# Patient Record
Sex: Female | Born: 1960 | Race: Black or African American | Hispanic: No | Smoking: Never smoker
Health system: Southern US, Community
[De-identification: ages and names within clinical notes are randomized; demographics above are authoritative.]

## PROBLEM LIST (undated history)

## (undated) DIAGNOSIS — M109 Gout, unspecified: Secondary | ICD-10-CM

## (undated) DIAGNOSIS — R011 Cardiac murmur, unspecified: Secondary | ICD-10-CM

## (undated) DIAGNOSIS — I499 Cardiac arrhythmia, unspecified: Secondary | ICD-10-CM

## (undated) DIAGNOSIS — J189 Pneumonia, unspecified organism: Secondary | ICD-10-CM

## (undated) DIAGNOSIS — M1990 Unspecified osteoarthritis, unspecified site: Secondary | ICD-10-CM

## (undated) DIAGNOSIS — C50919 Malignant neoplasm of unspecified site of unspecified female breast: Secondary | ICD-10-CM

## (undated) DIAGNOSIS — Z6841 Body Mass Index (BMI) 40.0 and over, adult: Secondary | ICD-10-CM

## (undated) DIAGNOSIS — IMO0001 Reserved for inherently not codable concepts without codable children: Secondary | ICD-10-CM

## (undated) DIAGNOSIS — I10 Essential (primary) hypertension: Secondary | ICD-10-CM

## (undated) HISTORY — DX: Body Mass Index (BMI) 40.0 and over, adult: Z684

## (undated) HISTORY — DX: Malignant neoplasm of unspecified site of unspecified female breast: C50.919

## (undated) HISTORY — PX: COLONOSCOPY: SHX174

## (undated) HISTORY — PX: TONSILLECTOMY: SUR1361

## (undated) HISTORY — DX: Morbid (severe) obesity due to excess calories: E66.01

## (undated) HISTORY — DX: Essential (primary) hypertension: I10

## (undated) HISTORY — DX: Gout, unspecified: M10.9

## (undated) HISTORY — DX: Unspecified osteoarthritis, unspecified site: M19.90

## (undated) HISTORY — PX: FRACTURE SURGERY: SHX138

---

## 1990-03-04 HISTORY — PX: MASTECTOMY: SHX3

## 1997-11-11 ENCOUNTER — Ambulatory Visit (HOSPITAL_COMMUNITY): Admission: RE | Admit: 1997-11-11 | Discharge: 1997-11-11 | Payer: Self-pay | Admitting: Orthopedic Surgery

## 1997-11-11 ENCOUNTER — Emergency Department (HOSPITAL_COMMUNITY): Admission: EM | Admit: 1997-11-11 | Discharge: 1997-11-11 | Payer: Self-pay | Admitting: Emergency Medicine

## 1997-11-12 ENCOUNTER — Observation Stay (HOSPITAL_COMMUNITY): Admission: RE | Admit: 1997-11-12 | Discharge: 1997-11-13 | Payer: Self-pay | Admitting: Orthopedic Surgery

## 1998-08-05 ENCOUNTER — Emergency Department (HOSPITAL_COMMUNITY): Admission: EM | Admit: 1998-08-05 | Discharge: 1998-08-05 | Payer: Self-pay | Admitting: Endocrinology

## 1998-08-05 ENCOUNTER — Encounter: Payer: Self-pay | Admitting: Endocrinology

## 1999-01-03 ENCOUNTER — Encounter: Payer: Self-pay | Admitting: Oncology

## 1999-01-03 ENCOUNTER — Encounter: Admission: RE | Admit: 1999-01-03 | Discharge: 1999-01-03 | Payer: Self-pay | Admitting: Oncology

## 2000-12-08 ENCOUNTER — Encounter: Admission: RE | Admit: 2000-12-08 | Discharge: 2000-12-08 | Payer: Self-pay | Admitting: Oncology

## 2000-12-08 ENCOUNTER — Encounter: Payer: Self-pay | Admitting: Oncology

## 2002-01-20 ENCOUNTER — Encounter: Payer: Self-pay | Admitting: Oncology

## 2002-01-20 ENCOUNTER — Encounter: Admission: RE | Admit: 2002-01-20 | Discharge: 2002-01-20 | Payer: Self-pay | Admitting: Oncology

## 2003-03-16 ENCOUNTER — Encounter: Admission: RE | Admit: 2003-03-16 | Discharge: 2003-03-16 | Payer: Self-pay | Admitting: Oncology

## 2004-07-06 ENCOUNTER — Ambulatory Visit (HOSPITAL_COMMUNITY): Admission: RE | Admit: 2004-07-06 | Discharge: 2004-07-06 | Payer: Self-pay | Admitting: Obstetrics and Gynecology

## 2004-07-16 ENCOUNTER — Other Ambulatory Visit: Admission: RE | Admit: 2004-07-16 | Discharge: 2004-07-16 | Payer: Self-pay | Admitting: Obstetrics and Gynecology

## 2004-12-18 ENCOUNTER — Ambulatory Visit: Payer: Self-pay | Admitting: *Deleted

## 2004-12-20 ENCOUNTER — Ambulatory Visit: Payer: Self-pay | Admitting: Obstetrics & Gynecology

## 2004-12-24 ENCOUNTER — Ambulatory Visit: Payer: Self-pay | Admitting: *Deleted

## 2004-12-27 ENCOUNTER — Ambulatory Visit: Payer: Self-pay | Admitting: *Deleted

## 2004-12-31 ENCOUNTER — Ambulatory Visit: Payer: Self-pay | Admitting: Obstetrics & Gynecology

## 2005-01-03 ENCOUNTER — Ambulatory Visit: Payer: Self-pay | Admitting: Obstetrics & Gynecology

## 2005-01-03 ENCOUNTER — Ambulatory Visit (HOSPITAL_COMMUNITY): Admission: RE | Admit: 2005-01-03 | Discharge: 2005-01-03 | Payer: Self-pay | Admitting: Obstetrics and Gynecology

## 2005-01-07 ENCOUNTER — Ambulatory Visit: Payer: Self-pay | Admitting: *Deleted

## 2005-01-07 ENCOUNTER — Inpatient Hospital Stay (HOSPITAL_COMMUNITY): Admission: AD | Admit: 2005-01-07 | Discharge: 2005-01-08 | Payer: Self-pay | Admitting: Obstetrics and Gynecology

## 2005-01-10 ENCOUNTER — Ambulatory Visit: Payer: Self-pay | Admitting: Obstetrics & Gynecology

## 2005-01-14 ENCOUNTER — Inpatient Hospital Stay (HOSPITAL_COMMUNITY): Admission: AD | Admit: 2005-01-14 | Discharge: 2005-01-14 | Payer: Self-pay | Admitting: Obstetrics and Gynecology

## 2005-01-16 ENCOUNTER — Inpatient Hospital Stay (HOSPITAL_COMMUNITY): Admission: AD | Admit: 2005-01-16 | Discharge: 2005-01-16 | Payer: Self-pay | Admitting: Obstetrics and Gynecology

## 2005-01-18 ENCOUNTER — Inpatient Hospital Stay (HOSPITAL_COMMUNITY): Admission: AD | Admit: 2005-01-18 | Discharge: 2005-01-18 | Payer: Self-pay | Admitting: Obstetrics and Gynecology

## 2005-01-21 ENCOUNTER — Ambulatory Visit: Payer: Self-pay | Admitting: Obstetrics and Gynecology

## 2005-01-21 ENCOUNTER — Inpatient Hospital Stay (HOSPITAL_COMMUNITY): Admission: AD | Admit: 2005-01-21 | Discharge: 2005-01-21 | Payer: Self-pay | Admitting: Obstetrics and Gynecology

## 2005-01-22 ENCOUNTER — Inpatient Hospital Stay (HOSPITAL_COMMUNITY): Admission: AD | Admit: 2005-01-22 | Discharge: 2005-01-27 | Payer: Self-pay | Admitting: Obstetrics and Gynecology

## 2005-01-28 ENCOUNTER — Encounter: Admission: RE | Admit: 2005-01-28 | Discharge: 2005-02-26 | Payer: Self-pay | Admitting: Obstetrics and Gynecology

## 2005-02-01 ENCOUNTER — Inpatient Hospital Stay (HOSPITAL_COMMUNITY): Admission: AD | Admit: 2005-02-01 | Discharge: 2005-02-01 | Payer: Self-pay | Admitting: Obstetrics and Gynecology

## 2005-02-03 ENCOUNTER — Inpatient Hospital Stay (HOSPITAL_COMMUNITY): Admission: AD | Admit: 2005-02-03 | Discharge: 2005-02-03 | Payer: Self-pay | Admitting: Obstetrics and Gynecology

## 2005-02-04 ENCOUNTER — Inpatient Hospital Stay (HOSPITAL_COMMUNITY): Admission: AD | Admit: 2005-02-04 | Discharge: 2005-02-04 | Payer: Self-pay | Admitting: Obstetrics and Gynecology

## 2005-02-05 ENCOUNTER — Inpatient Hospital Stay (HOSPITAL_COMMUNITY): Admission: AD | Admit: 2005-02-05 | Discharge: 2005-02-08 | Payer: Self-pay | Admitting: Obstetrics and Gynecology

## 2005-02-27 ENCOUNTER — Encounter: Admission: RE | Admit: 2005-02-27 | Discharge: 2005-03-29 | Payer: Self-pay | Admitting: Obstetrics and Gynecology

## 2007-04-27 ENCOUNTER — Emergency Department (HOSPITAL_COMMUNITY): Admission: EM | Admit: 2007-04-27 | Discharge: 2007-04-27 | Payer: Self-pay | Admitting: Family Medicine

## 2010-03-24 ENCOUNTER — Encounter: Payer: Self-pay | Admitting: Obstetrics and Gynecology

## 2010-07-20 NOTE — Discharge Summary (Signed)
Danielle Oliver, Danielle Oliver                ACCOUNT NO.:  [id number]   MEDICAL RECORD NO.:  [id number]          PATIENT TYPE:  INP   LOCATION:  9373                          FACILITY:  [facility]   PHYSICIAN:  Crist Fat. Rivard, M.D. DATE OF BIRTH:  05-20-1960   DATE OF ADMISSION:  01/22/2005  DATE OF DISCHARGE:  01/27/2005                                 DISCHARGE SUMMARY   ADMITTING DIAGNOSES:  1.  Intrauterine pregnancy at 37 and three-sevenths weeks.  2.  Chronic hypertension with superimposed preeclampsia.  3.  Obesity with weight greater than 350 pounds.  4.  History of breast cancer.   PREOPERATIVE DIAGNOSES:  1.  Intrauterine pregnancy at term.  2.  Obesity, weight greater than 350 pounds.  3.  Preeclampsia.  4.  Failure to progress in labor.   DISCHARGE DIAGNOSES:  1.  Term pregnancy, delivered.  2.  Obesity, weight greater than 350 pounds.  3.  Preeclampsia.  4.  Failure to progress in labor.   PROCEDURE:  Primary low transverse cesarean section.   Ms. Knack is a 50 year old gravida 4 para 2-0-1-2 who presented at 24 and  three-sevenths weeks with elevated blood pressures. She returned a 24-hour  urine collection at that time which was found to be 300 g of protein for the  diagnosis of superimposed preeclampsia. The patient was therefore kept in  the hospital and options reviewed with her. Decision was made for induction  of labor beginning January 23, 2005. On January 23, 2005, at 6 p.m.  options were again reviewed with the patient, this time by Dr. Marline Backbone. The patient declined serial induction and desired to proceed with  low transverse cesarean section. This was performed on January 23, 2005, by  Dr. Malachy Mood with the birth of a 6-pound 7-ounce female infant named  Ariel with Apgar scores of 5 at one minute and 8 at five minutes. The  patient's postoperative course was complicated by preeclampsia. In the first  24 hours the patient's blood pressures remained  high and she did not diurese  well. On postoperative day #2 Dr. Estanislado Pandy added hydrochlorothiazide 25 mg  daily to her regimen, magnesium sulfate was stopped, and the patient was  begun on labetalol 400 mg t.i.d. Her blood pressures began to stabilize and  on her fourth postoperative day, her blood pressures remain 140s to 150s  over 80s. Her JP drain was removed at that time. Her labetalol was increased  to 600 mg q.a.m., 400 mg in the afternoon, and 400 mg at bedtime. Her  incision was clean, dry and intact. She was afebrile and in all other way  stable, her blood work remained stable. Therefore, she was judged to be in  satisfactory condition for discharge.   DISCHARGE INSTRUCTIONS:  Per [facility] handout.   DISCHARGE MEDICATIONS:  1.  Labetalol 600 mg p.o. q.a.m., 400 mg p.o. every afternoon, and 400 mg at      bedtime plus hydrochlorothiazide 25 mg p.o. q.a.m.  2.  Motrin 600 mg p.o. q.6h. p.r.n. pain.  3.  Tylox one to two  p.o. q.3-4h. pain.   Follow-up will be in the office at [facility] in 1 week. The patient is to call  for any signs or symptoms of PIH including headache, visual changes or  epigastric pain. She will call for any elevated temperature or any problems  or concerns.      Rica Koyanagi, C.N.M.      Crist Fat Rivard, M.D.  Electronically Signed    SDM/MEDQ  D:  01/30/2005  T:  01/30/2005  Job:  [id number]
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
## 2010-07-20 NOTE — Discharge Summary (Signed)
Danielle Oliver, SMIGEL                ACCOUNT NO.:  [id number]   MEDICAL RECORD NO.:  [id number]          PATIENT TYPE:  INP   LOCATION:  9374                          [facility]   PHYSICIAN:  Janine Limbo, M.D.DATE OF BIRTH:  12-13-1960   DATE OF ADMISSION:  02/05/2005  DATE OF DISCHARGE:  02/08/2005                                 DISCHARGE SUMMARY   ADMISSION DIAGNOSES:  Postpartum with pre-eclampsia.   DISCHARGE DIAGNOSES:  Postpartum 16 days with pre-eclampsia resolving.   PROCEDURE:  None.   HOSPITAL COURSE:  Danielle Oliver is a 50 year old gravida 4, para 3-0-1-3 who was  admitted at 13 days post Cesarean section for pre-eclampsia with persistent  elevated blood pressures despite changes in medications.  The patient also  with 24 hour urine result of 374 mg in 24 hour period.  The patient had been  followed carefully on an outpatient basis with increasing doses of Labetalol  and hydrochlorothiazide with continued elevated blood pressure's.  The  patient was asymptomatic at the time of admission.  The patient had  previously been treated with magnesium during the course of her  hospitalization and delivery.  Throughout the patient's hospital stay the  patient was treated with magnesium sulfate for 48 hours.  The magnesium  sulfate was discontinued on February 07, 2005.  The patient's medications  also changed to Procardia XL from Labetalol with subsequent decreased blood  pressures.  The patient's blood pressures decreased to 140's-150's/70's-80's  in the 24 hours prior to discharge off the magnesium sulfate. The patient,  however, has experienced some headache during the course of her  hospitalization which she felt was due to magnesium, however, the headache  has been relieved with analgesics and has not changed in intensity since  onset.  The patient denies other symptoms at the time of discharge.   LABORATORY DATA:  The patient's laboratory work remained stable  throughout  her hospitalization with discharge labs revealing SGOT 16, SGPT 13, LDL 145,  uric acid 7.9, hemoglobin 12.3, hematocrit 36.2, platelet count 465,000 at  the time of discharge.   By the third day of hospitalization the patient was felt to be stable and  blood pressures were markedly improved from the time of admission and she  was deemed to have received the full benefit of her hospital stay.  She was  discharged home.   CONDITION ON DISCHARGE:  Stable.   DISCHARGE INSTRUCTIONS:  Per [facility] handout previously given  and patient to continue with normal activity and normal diet at the present  time.   DISCHARGE MEDICATIONS:  1.  Darvocet-N 100, one to two tablets every 4 to 6 hours as needed for      headache.  2.  Hydrochlorothiazide 25 mg one tablet daily.  3.  Procardia XL 60 mg one tablet daily.   DISCHARGE FOLLOW UP:  The patient will return to the office on February 15, 2005 at 2:00 P.M. for follow up visit and blood pressure check.  The patient  was encouraged to call if she has questions or concerns prior  to her next  visit.  The patient was given appropriate warning signs and was encouraged  to call.      Rhona Leavens, CNM      Janine Limbo, M.D.  Electronically Signed    NOS/MEDQ  D:  02/08/2005  T:  02/08/2005  Job:  [id number]

## 2010-07-20 NOTE — Op Note (Signed)
Danielle, Oliver                ACCOUNT NO.:  [id number]   MEDICAL RECORD NO.:  [id number]          PATIENT TYPE:  INP   LOCATION:  9373                          [facility]   PHYSICIAN:  Janine Limbo, M.D.DATE OF BIRTH:  June 15, 1960   DATE OF PROCEDURE:  01/23/2005  DATE OF DISCHARGE:                                 OPERATIVE REPORT   PREOPERATIVE DIAGNOSIS:  1.  Term intrauterine pregnancy.  2.  Obesity (weight is greater than 350 pounds).  3.  Preeclampsia.  4.  Failure to progress in labor.   POSTOPERATIVE DIAGNOSIS:  1.  Term intrauterine pregnancy.  2.  Obesity (weight is greater than 350 pounds).  3.  Preeclampsia.  4.  Failure to progress in labor.   PROCEDURE:  Primary low transverse cesarean section.   SURGEON:  Dr. Leonard Schwartz.   FIRST ASSISTANT:  Renaldo Reel. Emilee Hero, C.N.M.   ANESTHETIC:  Spinal.   DISPOSITION:  Danielle Oliver is a 50 year old female, gravida 4, para 2-0-1-2,  who was admitted on 01/22/2005 at 37-1/2 weeks' gestation with preeclampsia.  She was given Cervidil and Pitocin. She did not dilate her cervix. The  patient was offered serial inductions. She carefully considered the risks  and benefits associated with all of her options. She elected to proceed with  cesarean delivery. We reviewed the risks of cesarean section including, but  not limited to, anesthetic complications, bleeding, infections, and possible  damage to surrounding organs.   FINDINGS:  A 6 pounds 7 ounces female [employer] officer[employer]) was delivered from a cephalic  presentation. The Apgars were 5 at one minute and 8 at 5 minutes. The  uterus, fallopian tubes, and the ovaries were normal for the gravid state.   PROCEDURE:  The patient was taken to the operating room where a spinal  anesthetic was given. The patient's abdomen, perineum, and outer vagina were  prepped with multiple layers of Hibiclens. A Foley catheter was placed in  the bladder. The patient was sterilely draped.  The lower abdomen was  injected with 20 mL of half percent Marcaine with epinephrine. A low  transverse incision was made in the abdomen and carried sharply through the  subcutaneous tissue, fascia, and the anterior peritoneum. The bladder flap  was developed. An incision was made in the lower uterine segment and  extended transversely. The fetal head was delivered without difficulty. The  mouth and nose were suctioned. The remainder of the infant was then  delivered. The cord was clamped and cut and the infant was handed to the  awaiting pediatric team. Routine cord blood studies were obtained. The  placenta was removed. The uterine cavity was cleaned of amniotic fluid,  clotted blood, and membranes. Uterine incision was closed using a running  locking suture of 2-0 Vicryl followed by an imbricating suture of 2-0  Vicryl. Hemostasis was adequate. The pelvis was vigorously irrigated. The  anterior peritoneum and abdominal musculature were reapproximated in the  midline using 2-0 Vicryl. The fascia was closed using a running suture of 0  Vicryl followed by three interrupted sutures of 0 Vicryl.  The Jackson-Pratt  drain was placed in the subcutaneous layer and brought out through the left  lower quadrant. It was sutured into place using 3-0 silk. Subcutaneous layer  was closed using 2-0 Vicryl. The skin was reapproximated using a  subcuticular suture of 3-0 Monocryl. Sponge, needle, instrument counts were  correct on two occasions. The estimated blood loss was 800 mL. The patient  tolerated her procedure well. She was taken to the recovery room in stable  condition. The infant was taken to the full-term nursery in stable  condition.      Janine Limbo, M.D.  Electronically Signed     AVS/MEDQ  D:  01/23/2005  T:  01/24/2005  Job:  [id number]
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
## 2010-11-09 ENCOUNTER — Emergency Department (HOSPITAL_COMMUNITY): Payer: BC Managed Care – PPO

## 2010-11-09 ENCOUNTER — Emergency Department (HOSPITAL_COMMUNITY)
Admission: EM | Admit: 2010-11-09 | Discharge: 2010-11-09 | Disposition: A | Discharge status: Home / Self Care | Payer: BC Managed Care – PPO | Attending: Emergency Medicine | Admitting: Emergency Medicine

## 2010-11-09 DIAGNOSIS — IMO0001 Reserved for inherently not codable concepts without codable children: Secondary | ICD-10-CM | POA: Insufficient documentation

## 2010-11-09 DIAGNOSIS — M79609 Pain in unspecified limb: Secondary | ICD-10-CM | POA: Insufficient documentation

## 2010-11-09 DIAGNOSIS — M5430 Sciatica, unspecified side: Secondary | ICD-10-CM | POA: Insufficient documentation

## 2010-11-09 DIAGNOSIS — E789 Disorder of lipoprotein metabolism, unspecified: Secondary | ICD-10-CM | POA: Insufficient documentation

## 2010-11-09 DIAGNOSIS — Z79899 Other long term (current) drug therapy: Secondary | ICD-10-CM | POA: Insufficient documentation

## 2010-11-09 DIAGNOSIS — M25559 Pain in unspecified hip: Secondary | ICD-10-CM | POA: Insufficient documentation

## 2010-11-09 DIAGNOSIS — I10 Essential (primary) hypertension: Secondary | ICD-10-CM | POA: Insufficient documentation

## 2010-11-09 LAB — POCT I-STAT, CHEM 8
BUN: 22 mg/dL (ref 6–23)
Calcium, Ion: 1.21 mmol/L (ref 1.12–1.32)
HCT: 43 % (ref 36.0–46.0)
Hemoglobin: 14.6 g/dL (ref 12.0–15.0)
Sodium: 140 mEq/L (ref 135–145)
TCO2: 29 mmol/L (ref 0–100)

## 2010-11-26 LAB — POCT URINALYSIS DIP (DEVICE)
Hgb urine dipstick: NEGATIVE
Nitrite: NEGATIVE
Protein, ur: NEGATIVE
Urobilinogen, UA: 0.2
pH: 6

## 2010-11-26 LAB — POCT PREGNANCY, URINE
Operator id: 116391
Preg Test, Ur: NEGATIVE

## 2012-03-30 DIAGNOSIS — I10 Essential (primary) hypertension: Secondary | ICD-10-CM | POA: Insufficient documentation

## 2013-04-23 ENCOUNTER — Encounter: Payer: Self-pay | Admitting: Internal Medicine

## 2013-06-09 ENCOUNTER — Encounter: Payer: BC Managed Care – PPO | Admitting: Internal Medicine

## 2013-12-21 ENCOUNTER — Other Ambulatory Visit: Payer: Self-pay

## 2014-01-14 ENCOUNTER — Telehealth: Payer: Self-pay | Admitting: *Deleted

## 2014-01-14 ENCOUNTER — Other Ambulatory Visit: Payer: Self-pay | Admitting: Radiology

## 2014-01-14 ENCOUNTER — Other Ambulatory Visit: Payer: Self-pay | Admitting: *Deleted

## 2014-01-14 DIAGNOSIS — C50411 Malignant neoplasm of upper-outer quadrant of right female breast: Secondary | ICD-10-CM

## 2014-01-14 DIAGNOSIS — Z170 Estrogen receptor positive status [ER+]: Secondary | ICD-10-CM

## 2014-01-14 DIAGNOSIS — Z9012 Acquired absence of left breast and nipple: Secondary | ICD-10-CM

## 2014-01-14 DIAGNOSIS — C50911 Malignant neoplasm of unspecified site of right female breast: Secondary | ICD-10-CM

## 2014-01-14 NOTE — Telephone Encounter (Signed)
Confirmed [facility] for 01/19/14 at 12N .  Instructions and contact information given.

## 2014-01-19 ENCOUNTER — Ambulatory Visit
Admission: RE | Admit: 2014-01-19 | Discharge: 2014-01-19 | Disposition: A | Discharge status: Home / Self Care | Payer: BC Managed Care – PPO | Source: Ambulatory Visit | Attending: Radiation Oncology | Admitting: Radiation Oncology

## 2014-01-19 ENCOUNTER — Other Ambulatory Visit (INDEPENDENT_AMBULATORY_CARE_PROVIDER_SITE_OTHER): Payer: Self-pay | Admitting: Surgery

## 2014-01-19 ENCOUNTER — Encounter: Payer: Self-pay | Admitting: Physical Therapy

## 2014-01-19 ENCOUNTER — Ambulatory Visit (HOSPITAL_BASED_OUTPATIENT_CLINIC_OR_DEPARTMENT_OTHER): Payer: BC Managed Care – PPO

## 2014-01-19 ENCOUNTER — Ambulatory Visit: Payer: BC Managed Care – PPO | Attending: Surgery | Admitting: Physical Therapy

## 2014-01-19 ENCOUNTER — Encounter: Payer: Self-pay | Admitting: Hematology

## 2014-01-19 ENCOUNTER — Encounter: Payer: Self-pay | Admitting: *Deleted

## 2014-01-19 ENCOUNTER — Ambulatory Visit (HOSPITAL_BASED_OUTPATIENT_CLINIC_OR_DEPARTMENT_OTHER): Payer: BC Managed Care – PPO | Admitting: Hematology

## 2014-01-19 ENCOUNTER — Other Ambulatory Visit (HOSPITAL_BASED_OUTPATIENT_CLINIC_OR_DEPARTMENT_OTHER): Payer: BC Managed Care – PPO

## 2014-01-19 VITALS — BP 147/84 | HR 102 | Temp 98.1°F | Resp 19 | Ht 67.0 in | Wt 337.5 lb

## 2014-01-19 DIAGNOSIS — C50911 Malignant neoplasm of unspecified site of right female breast: Secondary | ICD-10-CM | POA: Diagnosis not present

## 2014-01-19 DIAGNOSIS — Z5189 Encounter for other specified aftercare: Secondary | ICD-10-CM | POA: Diagnosis not present

## 2014-01-19 DIAGNOSIS — C50411 Malignant neoplasm of upper-outer quadrant of right female breast: Secondary | ICD-10-CM

## 2014-01-19 DIAGNOSIS — M25511 Pain in right shoulder: Secondary | ICD-10-CM | POA: Insufficient documentation

## 2014-01-19 DIAGNOSIS — M25611 Stiffness of right shoulder, not elsewhere classified: Secondary | ICD-10-CM

## 2014-01-19 DIAGNOSIS — Z170 Estrogen receptor positive status [ER+]: Secondary | ICD-10-CM

## 2014-01-19 DIAGNOSIS — C773 Secondary and unspecified malignant neoplasm of axilla and upper limb lymph nodes: Secondary | ICD-10-CM

## 2014-01-19 LAB — COMPREHENSIVE METABOLIC PANEL (CC13)
ALT: 17 U/L (ref 0–55)
ANION GAP: 10 meq/L (ref 3–11)
AST: 19 U/L (ref 5–34)
Albumin: 3.4 g/dL — ABNORMAL LOW (ref 3.5–5.0)
Alkaline Phosphatase: 160 U/L — ABNORMAL HIGH (ref 40–150)
BILIRUBIN TOTAL: 0.82 mg/dL (ref 0.20–1.20)
BUN: 11.5 mg/dL (ref 7.0–26.0)
CALCIUM: 9.8 mg/dL (ref 8.4–10.4)
CO2: 27 meq/L (ref 22–29)
Chloride: 106 mEq/L (ref 98–109)
Creatinine: 0.9 mg/dL (ref 0.6–1.1)
GLUCOSE: 99 mg/dL (ref 70–140)
Potassium: 3.5 mEq/L (ref 3.5–5.1)
SODIUM: 143 meq/L (ref 136–145)
TOTAL PROTEIN: 7.5 g/dL (ref 6.4–8.3)

## 2014-01-19 LAB — CBC WITH DIFFERENTIAL/PLATELET
BASO%: 0.4 % (ref 0.0–2.0)
Basophils Absolute: 0 10*3/uL (ref 0.0–0.1)
EOS ABS: 0.4 10*3/uL (ref 0.0–0.5)
EOS%: 4 % (ref 0.0–7.0)
HEMATOCRIT: 40.5 % (ref 34.8–46.6)
HGB: 13 g/dL (ref 11.6–15.9)
LYMPH%: 24.3 % (ref 14.0–49.7)
MCH: 28.1 pg (ref 25.1–34.0)
MCHC: 32.1 g/dL (ref 31.5–36.0)
MCV: 87.5 fL (ref 79.5–101.0)
MONO#: 0.5 10*3/uL (ref 0.1–0.9)
MONO%: 5.4 % (ref 0.0–14.0)
NEUT%: 65.9 % (ref 38.4–76.8)
NEUTROS ABS: 6.6 10*3/uL — AB (ref 1.5–6.5)
PLATELETS: 330 10*3/uL (ref 145–400)
RBC: 4.63 10*6/uL (ref 3.70–5.45)
RDW: 14.4 % (ref 11.2–14.5)
WBC: 10 10*3/uL (ref 3.9–10.3)
lymph#: 2.4 10*3/uL (ref 0.9–3.3)

## 2014-01-19 NOTE — Patient Instructions (Signed)

## 2014-01-19 NOTE — Progress Notes (Signed)
Ms. Danielle Oliver is a very pleasant 53 y.o.Marland Kitchen female from [place], [place] with newly diagnosed grade 2 invasive ductal carcinoma of the right breast.  Biopsy results also revealed pathology indicating the tumor is ER positive, PR positive, and HER2/neu negative. Ki67 is 10%.   She presents today unaccompanied to the [facility]) for treatment consideration and recommendations from the breast surgeon, radiation oncologist, and medical oncologist.     I briefly met with Danielle Oliver during her [facility] visit today. We discussed the purpose of the Survivorship Clinic, which will include monitoring for recurrence, coordinating completion of age and gender-appropriate cancer screenings, promotion of overall wellness, as well as managing potential late/long-term side effects of anti-cancer treatments.    As of today, the treatment plan for Danielle Oliver will likely include surgery, chemotherapy, and radiation therapy.  She will also meet with the Genetics Counselor due to her personal history of breast cancer. Endocrine therapy with an aromatase inhibitor will also be considered for her as well.The intent of treatment for Danielle Oliver is cure, therefore she will be eligible for the Survivorship Clinic upon her completion of tr eatment.  Her survivorship care plan (SCP) document will be drafted and updated throughout the course of her treatment trajectory. She will receive the SCP in an office visit with myself in the Survivorship Clinic once she has completed treatment.   Danielle Oliver was encouraged to ask questions and all questions were answered to her satisfaction.  She was given my business card and encouraged to contact me with any concerns regarding survivorship.  I look forward to  participating in her care.

## 2014-01-19 NOTE — Progress Notes (Signed)
Checked in new patient with no financial issues prior to seeing the dr. She has appt card and has not been out of the country. I advised her of alight fund and will call me if needed.

## 2014-01-19 NOTE — Progress Notes (Signed)
[place] Psychosocial Distress Screening Breast Clinic Clinical Social Work  Clinical Social Work was referred by distress screening protocol.  The patient scored a 4 on the Psychosocial Distress Thermometer which indicates mild distress. Clinical Social Worker met with pt today at Breast Clinic to assess for distress and other psychosocial needs. CSW reviewed role of CSW and discussed the members of the Pt and Family Support Team. CSW discussed Support Programs and additional resources to assist in her cancer journey. CSW provided pt with Support Program calendar and handouts. Pt may be open to an [employer], but wanted to consider further, she may also attend Breast Cancer Support Group. Pt reports some concerns about her 9yo daughter's coping to her cancer. CSW reviewed ways to discuss with children and additional ways/resources to assist. CSW available for additional support as well. She agrees to reach out to [employer] as needed.   ONCBCN DISTRESS SCREENING 01/19/2014  Screening Type Initial Screening  Distress experienced in past week (1-10) 4  Emotional problem type Adjusting to illness;Isolation/feeling alone  Referral to clinical social work Yes  Referral to support programs Yes    Clinical Social Worker follow up needed: No.  If yes, follow up plan:  Loren Racer, [place]  [facility] Phone: [phone] Fax: [phone]

## 2014-01-19 NOTE — Progress Notes (Signed)
  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 female  Who has a history of left mastectomy and chemotherapy in 1991. She presented with a palpable right breast mass and skin retraction. A 3.6 cm mass was noted with enlarged right axillary node.  The biopsy showed invasive ductal carcinoma with ER+PR+ HER2 pending with Ki67 of 10% in the breast and was positive for invasive mammary carcinoma.   PREVIOUS RADIATION THERAPY: No  FAMILY HISTORY: She had a father with colon cancer at 26.   SOCIAL HISTORY:  History  Substance Use Topics  . Smoking status: Never Smoker   . Smokeless tobacco: Not on file  . Alcohol Use: 1.2 oz/week    2 Glasses of wine per week    REVIEW OF SYSTEMS:  A 15 point review of systems is documented in the electronic medical record. This was obtained by the nursing staff. However, I reviewed this with the patient to discuss relevant findings and make appropriate changes.  Pertinent positives are included in the chart.   PHYSICAL EXAM:  Alert and oriented. S/P mastectomy on the left with no palpable or visible signs of tumor recurrence.  No palpable axillary adenopathy, cervical or supraclavicular adenopathy.   IMPRESSION: T2N1 Invasive Ductal Carcinoma Right Breast Cancer   PLAN: I spoke to the patient today regarding her diagnosis and options for treatment. We discussed the equivalence in terms of survival and local failure between mastectomy and breast conservation. We discussed the role of radiation in patients who have positive lymph nodes. We discussed the process of simulation and the placement tattoos. We discussed 6 weeks of treatment as an outpatient. We discussed  the possibility of asymptomatic lung damage. We discussed the low likelihood of secondary malignancies. We discussed the possible side effects including but not limited to skin redness, fatigue, permanent skin darkening, and chest wall swelling. We discussed increased complications that can occur with reconstruction after radiation.   She met with surgery, medical oncology, radiation oncology, physical therapy and our navigators.   I spent 60 minutes  face to face with the patient and more than 50% of that time was spent in counseling and/or coordination of care.   ------------------------------------------------  Thea Silversmith, MD

## 2014-01-19 NOTE — H&P (Signed)
Danielle Oliver 01/19/2014 8:23 AM Location: [facility] Patient #: [id number] DOB: 08/14/1960 Undefined / Language: Danielle Oliver / Race: Undefined Female History of Present Illness Danielle Oliver A. Hanni Milford MD; 01/19/2014 3:01 PM) Patient words: right breast cancer   [facility] atthe request of Dr Marcelo Baldy os Oliver for right breast cancer. Pt has a hx of stage 3 left breast cancer treated in 1991 wuht right modified radical mastectomy and chemotherapy. She did not gget radiation therapy. Pt noticed a mass in her right breast a month ago. Had mammogram which showed a 3.6 cm UOQ mass. U/S showed this and abnormal left axillary LN that was positive for metastatic disease. She has had no problem with arm swelling.  The patient is a 53 year old female   Other Problems Danielle Oliver; 01/19/2014 8:23 AM) Arthritis Breast Cancer Gastroesophageal Reflux Disease High blood pressure Lump In Breast  Past Surgical History Danielle Oliver; 01/19/2014 8:23 AM) Breast Biopsy Right. Breast Mass; Local Excision Left. Cesarean Section - 1 Mastectomy Left. Oral Surgery Sentinel Lymph Node Biopsy Tonsillectomy  Diagnostic Studies History Danielle Oliver; 01/19/2014 8:23 AM) Colonoscopy 5-10 years ago Mammogram within last year Pap Smear 1-5 years ago  Social History Danielle Oliver; 01/19/2014 8:23 AM) Alcohol use Occasional alcohol use. Caffeine use Carbonated beverages, Coffee, Tea. No drug use Tobacco use Never smoker.  Family History Danielle Oliver; 01/19/2014 8:23 AM) Arthritis Mother. Colon Cancer Father. Colon Polyps Father. Diabetes Mellitus Family Members In General. Hypertension Father, Mother.  Pregnancy / Birth History Danielle Oliver; 01/19/2014 8:23 AM) Age at menarche 20 years. Age of menopause [age] Contraceptive History Contraceptive implant, Oral contraceptives. Gravida 4 Irregular periods Maternal age  38-25 Para 3     Review of Systems Danielle Oliver; 01/19/2014 8:23 AM) General Not Present- Appetite Loss, Chills, Fatigue, Fever, Night Sweats, Weight Gain and Weight Loss. Skin Not Present- Change in Wart/Mole, Dryness, Hives, Jaundice, New Lesions, Non-Healing Wounds, Rash and Ulcer. HEENT Present- Seasonal Allergies and Wears glasses/contact lenses. Not Present- Earache, Hearing Loss, Hoarseness, Nose Bleed, Oral Ulcers, Ringing in the Ears, Sinus Pain, Sore Throat, Visual Disturbances and Yellow Eyes. Respiratory Present- Snoring. Not Present- Bloody sputum, Chronic Cough, Difficulty Breathing and Wheezing. Breast Present- Breast Mass. Not Present- Breast Pain, Nipple Discharge and Skin Changes. Cardiovascular Not Present- Chest Pain, Difficulty Breathing Lying Down, Leg Cramps, Palpitations, Rapid Heart Rate, Shortness of Breath and Swelling of Extremities. Gastrointestinal Not Present- Abdominal Pain, Bloating, Bloody Stool, Change in Bowel Habits, Chronic diarrhea, Constipation, Difficulty Swallowing, Excessive gas, Gets full quickly at meals, Hemorrhoids, Indigestion, Nausea, Rectal Pain and Vomiting. Musculoskeletal Present- Joint Stiffness and Muscle Pain. Not Present- Back Pain, Joint Pain, Muscle Weakness and Swelling of Extremities. Neurological Not Present- Decreased Memory, Fainting, Headaches, Numbness, Seizures, Tingling, Tremor, Trouble walking and Weakness. Psychiatric Not Present- Anxiety, Bipolar, Change in Sleep Pattern, Depression, Fearful and Frequent crying. Endocrine Present- Hot flashes. Not Present- Cold Intolerance, Excessive Hunger, Hair Changes, Heat Intolerance and New Diabetes. Hematology Not Present- Easy Bruising, Excessive bleeding, Gland problems, HIV and Persistent Infections.   Physical Exam (Danielle Oliver A. Ebenezer Mccaskey MD; 01/19/2014 3:03 PM)  General Mental Status-Alert. General Appearance-Consistent with stated age. Hydration-Well  hydrated. Voice-Normal.  Head and Neck Head-normocephalic, atraumatic with no lesions or palpable masses. Trachea-midline. Thyroid Gland Characteristics - normal size and consistency.  Chest and Lung Exam Chest and lung exam reveals -quiet, even and easy respiratory effort with no use of accessory muscles and on auscultation, normal breath sounds, no adventitious sounds  and normal vocal resonance. Inspection Chest Wall - Normal. Back - normal.  Breast Note: left breast surgically absent. no masses  right breast with mobile 3 cm mass UOQ. skin dimpling noted. no draiange bilaterally.   Cardiovascular Cardiovascular examination reveals -normal heart sounds, regular rate and rhythm with no murmurs and normal pedal pulses bilaterally.  Abdomen Inspection Inspection of the abdomen reveals - No Hernias. Skin - Scar - no surgical scars. Palpation/Percussion Palpation and Percussion of the abdomen reveal - Soft, Non Tender, No Rebound tenderness, No Rigidity (guarding) and No hepatosplenomegaly. Auscultation Auscultation of the abdomen reveals - Bowel sounds normal.  Neurologic Neurologic evaluation reveals -alert and oriented x 3 with no impairment of recent or remote memory. Mental Status-Normal.  Lymphatic Head & Neck  General Head & Neck Lymphatics: Bilateral - Description - Normal. Axillary  General Axillary Region: Bilateral - Description - Normal. Tenderness - Non Tender.    Assessment & Plan (Danielle Oliver A. Danielle Croswell MD; 01/19/2014 3:06 PM)  BREAST CANCER, RIGHT (174.9  C50.911) Impression: pt desires right MRM with reconstruction. will refer to plastics to coordinate. Pt does not sesire breast conservation. Discussed treatment options for breast cancer to include breast conservation vs mastectomy with reconstruction. Pt has decided on mastectomy. Risk include bleeding, infection, flap necrosis, pain, numbness, recurrence, hematoma, other surgery needs. Pt  understands and agrees to proceed. Risk of lymph node dissection include bleeding, infection, lymphedema, shoulder pain,. cosmetic deformity , blood clots, death, need for more surgery. Pt agres to proceed.  HISTORY OF LEFT BREAST CANCER (V10.3  Z85.3)

## 2014-01-19 NOTE — Progress Notes (Signed)
[place] NOTE  Patient Care Team: Helane Rima, MD as PCP - General (Family Medicine) Helane Rima, MD (Family Medicine) Truitt Merle, MD as Consulting Physician (Hematology) Erroll Luna, MD as Consulting Physician (General Surgery) Thea Silversmith, MD as Consulting Physician (Radiation Oncology) Trinda Pascal, NP as Nurse Practitioner (Nurse Practitioner)    Breast cancer of upper-outer quadrant of right female breast   01/12/2014 Mammogram 3.6cm lobulated solid mass in RUQ right breast, and an oval axillary node with cortex thickening.    01/13/2014 Initial Biopsy Right breast needle core biopsy: invasive ductal adenocarcinoma, ER90%(+), PR 90%(+) and HER2 (-). Ki 67 10%. right axillary node biopsy (+) IDA  (1/1)    01/14/2014 Initial Diagnosis Breast cancer of upper-outer quadrant of right female breast    CHIEF COMPLAINTS/PURPOSE OF CONSULTATION:  Right breast cancer, cT2N1M0, stage IIB   HISTORY OF PRESENTING ILLNESS:  Danielle Oliver 53 y.o. female is here because of newly diagnosed right stage IIB breast cancer.  She was diagnosed with stage III left breast cancer in 1991 when she was 53 years old. Unfortunately her breast cancer diagnosis and treatment history was not available for review today. Apparently she had left mastectomy, and 4 cycles of adjuvant chemotherapy under Dr. Mariana Kaufman care at our cancer center. Patient does not recall the name of the chemotherapy medication. She was also offered a clinical trial of bone marrow transplant for her breast cancer and she declined. She was followed by January mammograms and had no evidence of recurrence. Her last screening mammogram was in 2009 which was benign.  She noticed a lump in her right breast about 20 weeks ago. It was hard and the tender on palpitation. She denies any skin change or nipple discharge. She has no appetite change, weight loss, or other new symptoms. She had underwent a  diagnostic mammogram on 01/12/2014, which showed a 3.6 cm lobulated solid mass in right upper quadrant of right breast, and an oval axillary lymph nodes was cortical thickening. 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 Her right axillary lymph node biopsy also showed invasive adenocarcinoma. She is here in our multidisciplinary breast clinic today to discuss further management.    MEDICAL HISTORY:  Past Medical History  Diagnosis Date  . Breast cancer 1991  . Hypertension   . Gout   . Arthritis     SURGICAL HISTORY: Past Surgical History  Procedure Laterality Date  . Mastectomy Left   . Tonsillectomy      SOCIAL HISTORY: History   Social History  . Marital Status: Divorced    Spouse Name: N/A    Number of Children: 3  . Years of Education: N/A  She works as a Passenger transport manager. She has three children, age of 38, 19 and 93. She lives with her two children. His oldest child lives in [place].   GYN HISTORY: First menstrual period: 16 Last memstrual period: 2 years ago G4P3, with one abortion.  She is not on hormonal replacement     Social History Main Topics  . Smoking status: Never Smoker   . Smokeless tobacco: Not on file  . Alcohol Use: 1.2 oz/week    2 Glasses of wine per week  . Drug Use: No  . Sexual Activity: Not on file      FAMILY HISTORY: Family History  Problem Relation Age of Onset  . Colon cancer Father 52's  No family history of breast cancer, or other type of malignancy.   ALLERGIES:  is allergic to iodinated diagnostic agents and penicillins.  MEDICATIONS:  Current Outpatient Prescriptions  Medication Sig Dispense Refill  . amLODipine-olmesartan (AZOR) 5-40 MG per tablet Take 1 tablet by mouth daily.    . fexofenadine (ALLEGRA) 180 MG tablet Take 180 mg by mouth.    . lovastatin (MEVACOR) 40 MG tablet Take 40 mg by mouth.    . montelukast  (SINGULAIR) 10 MG tablet Take 1 tablet (10 mg total) by mouth at bedtime.     No current facility-administered medications for this visit.    REVIEW OF SYSTEMS:   Constitutional: Denies fevers, chills or abnormal night sweats, (+) intentional 14 lbs weight loss in the past 8 months Eyes: Denies blurriness of vision, double vision or watery eyes Ears, nose, mouth, throat, and face: Denies mucositis or sore throat Respiratory: Denies cough, dyspnea or wheezes Cardiovascular: Denies palpitation, chest discomfort or lower extremity swelling Gastrointestinal:  Denies nausea, heartburn or change in bowel habits Skin: Denies abnormal skin rashes Lymphatics: Denies new lymphadenopathy or easy bruising Neurological:Denies numbness, tingling or new weaknesses Behavioral/Psych: Mood is stable, no new changes  All other systems were reviewed with the patient and are negative.  PHYSICAL EXAMINATION: ECOG PERFORMANCE STATUS: 0 - Asymptomatic  Filed Vitals:   01/19/14 1246  BP: 147/84  Pulse: 102  Temp: 98.1 F (36.7 C)  Resp: 19   Filed Weights   01/19/14 1246  Weight: 337 lb 8 oz (153.089 kg)    GENERAL:obese lady, alert, no distress and comfortable SKIN: skin color, texture, turgor are normal, no rashes or significant lesions EYES: normal, conjunctiva are pink and non-injected, sclera clear OROPHARYNX:no exudate, no erythema and lips, buccal mucosa, and tongue normal  NECK: supple, thyroid normal size, non-tender, without nodularity LYMPH:  no palpable lymphadenopathy in the cervical, axillary or inguinal LUNGS: clear to auscultation and percussion with normal breathing effort HEART: regular rate & rhythm and no murmurs and no lower extremity edema ABDOMEN:abdomen soft, non-tender and normal bowel sounds Musculoskeletal:no cyanosis of digits and no clubbing  PSYCH: alert & oriented x 3 with fluent speech NEURO: no focal motor/sensory deficits BREAST: no skin color change or nipple  discharge. there is a 4 cm lump in the right upper quadrant of her right breast with some tenderness on palpitation, firm, mild skin bruits. And small palpable lymph nodes in the right axillary area with some tenderness. She is status post left mastectomy, no palpable mass or skin changes on the left chest.  LABORATORY DATA:  I have reviewed the data as listed Lab Results  Component Value Date   WBC 10.0 01/19/2014   HGB 13.0 01/19/2014   HCT 40.5 01/19/2014   MCV 87.5 01/19/2014   PLT 330 01/19/2014    Recent Labs  01/19/14 1221  NA 143  K 3.5  CO2 27  GLUCOSE 99  BUN 11.5  CREATININE 0.9  CALCIUM 9.8  PROT 7.5  ALBUMIN 3.4*  AST 19  ALT 17  ALKPHOS 160*  BILITOT 0.82    RADIOGRAPHIC STUDIES: I have personally reviewed the outside radiological images and report as listed and agreed with the findings in the report.  Pathology 01/13/14 #1Right breast needle core biopsy Invasive ductal carcinoma  #2right axillary lymph node biopsy 1 lymph node positive for metastatic mammary carcinoma.  ER 90% positive PR 90% positive Ki-67 10% HER-2 negative  ASSESSMENT & PLAN:  Patient is a 53 year old post  plus woman, with past medical history of stage III left breast cancer status post mastectomy and adjuvant chemotherapy, who was found to have a biopsy-proven T2N1 M0 stage IIb right breast invasive ductal carcinoma, ER/PR strongly positive and HER-2 negative.  She has discussed surgical options with our surgeon Dr. Malachi Paradise today and decided to have right mastectomy. Giving her biopsy-proven local lymph node involvement, I recommend new adjuvant or adjuvant chemotherapy to reduce her risks of future cancer recurrence. I'll try to get her prior chemotherapy records, although it may be difficult because it was given 24 years ago. It sounds like she received Adriamycin as her new adjuvant therapy, if that's the case or if I'm not able to find out her prior adjuvant chemotherapy regimen, I  would avoid adramycin and recommend docetaxel and Cytoxan as her new regiment for right breast cancer. Side effects were discussed with her, she is very concerned about hearing loss, and how to discuss this with her 34 year old daughter. She is leaning towards having surgery done first and possible chemotherapy afterwards.  Giving her strongly ER/PR positive in the tumor, she wasn't strongly benefit from adjuvant hormonal therapy. She is likely need adjuvant radiation also. Given her postmenopausal status, I would recommend one of the AI as her adjuvant hormonal therapy after radiation.  According to NCCN guideline, stage II breast cancer usually does not require a staging scans to ruled out distant metastasis, due to the low risks. She is asymptomatic, I do not think she needs staging scan.  I'll plan to see her back 2 to 3 weeks after her surgery. She plans to have bilateral construction after right mastectomy. She will be referred for genetic counseling also.   All questions were answered. The patient knows to call the clinic with any problems, questions or concerns. I spent 40 minutes counseling the patient face to face. The total time spent in the appointment was 60 minutes and more than 50% was on counseling.     Truitt Merle, MD 01/19/2014 1:40 PM

## 2014-01-19 NOTE — Therapy (Signed)
Physical Therapy Evaluation  Patient Details  Name: Danielle Oliver MRN: [id number] Date of Birth: 1960-08-17  Encounter Date: 01/19/2014      PT End of Session - 01/19/14 1632    Visit Number 1   Number of Visits 1   PT Start Time 1500   PT Stop Time 1530   PT Time Calculation (min) 30 min   Activity Tolerance Patient tolerated treatment well   Behavior During Therapy [facility] for tasks assessed/performed      Past Medical History  Diagnosis Date  . Breast cancer   . Hypertension   . Gout   . Arthritis     Past Surgical History  Procedure Laterality Date  . Mastectomy Left   . Tonsillectomy      There were no vitals taken for this visit.  Visit Diagnosis:  Breast cancer, right - Plan: PT plan of care cert/re-cert  Decreased right shoulder range of motion - Plan: PT plan of care cert/re-cert      Subjective Assessment - 01/19/14 1625    Symptoms Patient was seen today at the multidisciplinary breast clinic for an initial assessment of her right breast cancer.   Pertinent History Patient diagnosed 01/13/14 with right node positive, ER/PR positive breast cancer with a Ki67 of 10%.  She has a previous history of left breast cancer in 1991 which she underwent a left mastectomy and axillary node dissection.   Patient Stated Goals Reduce lymphedema risk and regain ROM after surgery.   Currently in Pain? Yes   Pain Score 2    Pain Location Knee   Pain Orientation Right   Pain Descriptors / Indicators Aching   Pain Type Chronic pain   Pain Onset More than a month ago   Pain Frequency Intermittent   Aggravating Factors  night time and rain   Pain Relieving Factors moving around   Effect of Pain on Daily Activities limits activity if pain is occuring   Multiple Pain Sites No          [facility] PT Assessment - 01/19/14 0001    Assessment   Medical Diagnosis Right breast cancer   Onset Date 01/13/14   Precautions   Precautions Other (comment)  active right breast cancer    Restrictions   Weight Bearing Restrictions No   Balance Screen   Has the patient fallen in the past 6 months No   Has the patient had a decrease in activity level because of a fear of falling?  No   Is the patient reluctant to leave their home because of a fear of falling?  No   Home Environment   Living Enviornment Private residence   Living Arrangements Children  52 year old daughter and 28 year old son   Available Help at Discharge Family   Prior Function   Level of [place] with basic ADLs   Cognition   Overall Cognitive Status Within Functional Limits for tasks assessed   Posture/Postural Control   Posture/Postural Control Postural limitations   Postural Limitations Rounded Shoulders;Forward head   AROM   Right Shoulder Extension 45 Degrees   Right Shoulder Flexion 142 Degrees   Right Shoulder ABduction 128 Degrees   Right Shoulder Internal Rotation 60 Degrees   Right Shoulder External Rotation 70 Degrees   Left Shoulder Extension 47 Degrees   Left Shoulder Flexion 157 Degrees   Left Shoulder ABduction 113 Degrees   Left Shoulder Internal Rotation 70 Degrees   Left Shoulder External Rotation 80  Degrees   Strength   Overall Strength Within functional limits for tasks performed            PT Education - 01/19/14 1632    Education provided Yes   Education Details Shoulder ROM exercises and lymphedema risk reduction practices   Person(s) Educated Patient   Methods Explanation;Demonstration;Handout   Comprehension Verbalized understanding;Returned demonstration              Plan - 01/19/14 1632    Clinical Impression Statement Patient was seen today for baseline assessment of her right breast cancer.  She is planning to have a right mastectomy wiht an axillary node dissection followed by chemotherapy and radiation.  She will benefit from PT after surgery to regain ROM in shoulder.   Pt will benefit from skilled therapeutic intervention in order  to improve on the following deficits Decreased range of motion;Increased edema;Decreased knowledge of precautions;Impaired UE functional use;Pain  if needed after surgery   Rehab Potential Good   Clinical Impairments Affecting Rehab Potential none   PT Frequency One time visit   PT Treatment/Interventions Therapeutic exercise;Patient/family education   Consulted and Agree with Plan of Care Patient        Problem List Patient Active Problem List   Diagnosis Date Noted  . Breast cancer of upper-outer quadrant of right female breast 01/14/2014  . BP (high blood pressure) 03/30/2012            LYMPHEDEMA/ONCOLOGY QUESTIONNAIRE - 01/19/14 1629    Type   Cancer Type Right breast cancer   Surgeries   Mastectomy Date 03/04/89  Left side   Axillary Lymph Node Dissection Date 03/04/89  on left   Treatment   Past Chemotherapy Treatment Yes   Date 03/04/89   Lymphedema Assessments   Lymphedema Assessments Upper extremities   Right Upper Extremity Lymphedema   15 cm Proximal to Olecranon Process 70.8 cm   Olecranon Process 35.6 cm   10 cm Proximal to Ulnar Styloid Process 33.5 cm   Just Proximal to Ulnar Styloid Process 18.7 cm   Across Hand at [employer] 19.1 cm   At [place] of 2nd Digit 6.1 cm   Left Upper Extremity Lymphedema   15 cm Proximal to Olecranon Process 70.4 cm   Olecranon Process 37.4 cm   10 cm Proximal to Ulnar Styloid Process 36 cm   Just Proximal to Ulnar Styloid Process 20.3 cm   Across Hand at [employer] 18.8 cm   At [place] of 2nd Digit 6 cm             Breast Clinic Goals - 01/19/14 1703    Patient will be able to verbalize understanding of pertinent lymphedema risk reduction practices relevant to her diagnosis specifically related to skin care.   Time 1   Period Days   Status Achieved   Patient will be able to return demonstrate and/or verbalize understanding of the post-op home exercise program related to regaining shoulder range of  motion.   Time 1   Period Days   Status Achieved   Patient will be able to verbalize understanding of the importance of attending the postoperative After Breast Cancer Class for further lymphedema risk reduction education and therapeutic exercise.   Time 1   Period Days   Status Achieved           Tamyrah Burbage,MARTI COOPER, PT 01/19/2014, 5:06 PM

## 2014-01-21 ENCOUNTER — Other Ambulatory Visit: Payer: BC Managed Care – PPO

## 2014-01-21 ENCOUNTER — Telehealth: Payer: Self-pay | Admitting: *Deleted

## 2014-01-21 NOTE — Telephone Encounter (Signed)
Faxed records to Dr. Leafy Ro office.

## 2014-01-24 ENCOUNTER — Telehealth: Payer: Self-pay | Admitting: *Deleted

## 2014-01-24 NOTE — Telephone Encounter (Signed)
Spoke with patient from [facility] 01/19/14.  She was in an elevator and stated she would call me back.

## 2014-01-25 ENCOUNTER — Inpatient Hospital Stay: Admission: RE | Admit: 2014-01-25 | Payer: BC Managed Care – PPO | Source: Ambulatory Visit

## 2014-01-26 ENCOUNTER — Encounter: Payer: BC Managed Care – PPO | Admitting: Genetic Counselor

## 2014-01-26 ENCOUNTER — Other Ambulatory Visit: Payer: BC Managed Care – PPO

## 2014-02-03 ENCOUNTER — Telehealth: Payer: Self-pay | Admitting: *Deleted

## 2014-02-03 NOTE — Telephone Encounter (Signed)
Pt called to cancel her genetic appt. Started a new job and is planning according. She will r/s at a later time. Physician team notified.

## 2014-02-07 ENCOUNTER — Encounter: Payer: BC Managed Care – PPO | Admitting: Genetic Counselor

## 2014-02-07 ENCOUNTER — Other Ambulatory Visit: Payer: BC Managed Care – PPO

## 2014-03-08 ENCOUNTER — Telehealth: Payer: Self-pay | Admitting: *Deleted

## 2014-03-08 NOTE — Telephone Encounter (Signed)
Spoke with patient concerning surgery.  She states that [facility] is requiring a deposit before scheduling surgery.  Spoke with Dr. Josetta Huddle nurse and the fee is supposed to be waived.  There have been some back and forth conversations with [facility] scheduling regarding this.  Michelle with Dr. Brantley Stage will clarify and contact the patient.  I have scheduled her with Dr. Burr Medico for 03/11/14 at 915am to discuss taking anti-estrogen therapy until she gets her surgery scheduled.  Support given and encouraged her to call with any needs or concerns.

## 2014-03-08 NOTE — Telephone Encounter (Signed)
Left message for a return phone call to schedule a follow up with Dr. Burr Medico.

## 2014-03-10 ENCOUNTER — Telehealth: Payer: Self-pay | Admitting: *Deleted

## 2014-03-10 NOTE — Telephone Encounter (Signed)
Received a call from patient asking if I have heard from [facility] about her surgery.  Informed her that I have not but she was supposed to have been contacted regarding this.  Informed her that I would follow up.  Spoke with Sharyn Lull at [facility] who stated Dr. Brantley Stage changed his mind and would like for the patient to pay something ($200) if possible and if not it could be waived.  Informed Sharyn Lull that the patient is not aware of this as she, as did I thought this was being waived.  I told Sharyn Lull that I felt this needed to come from [place].  No one had called her so she called me asking about this.  I explained to her what I was told from [facility] and patient is very frustrated and upset and she feels like they don't care about her just the money.  She is requesting another surgeon, because Dr. Brantley Stage could not do the surgery until February.    Discussed with Sharyn Lull from [place]. She is going to call patient today to discuss.  I will follow up tomorrow.

## 2014-03-11 ENCOUNTER — Other Ambulatory Visit: Payer: Self-pay | Admitting: *Deleted

## 2014-03-11 ENCOUNTER — Telehealth: Payer: Self-pay | Admitting: Hematology

## 2014-03-11 ENCOUNTER — Ambulatory Visit (HOSPITAL_BASED_OUTPATIENT_CLINIC_OR_DEPARTMENT_OTHER): Payer: BC Managed Care – PPO | Admitting: Hematology

## 2014-03-11 ENCOUNTER — Ambulatory Visit (HOSPITAL_BASED_OUTPATIENT_CLINIC_OR_DEPARTMENT_OTHER): Payer: BC Managed Care – PPO

## 2014-03-11 VITALS — BP 141/100 | HR 99 | Temp 98.2°F | Resp 21 | Ht 67.0 in | Wt 332.2 lb

## 2014-03-11 DIAGNOSIS — Z170 Estrogen receptor positive status [ER+]: Secondary | ICD-10-CM

## 2014-03-11 DIAGNOSIS — C773 Secondary and unspecified malignant neoplasm of axilla and upper limb lymph nodes: Secondary | ICD-10-CM

## 2014-03-11 DIAGNOSIS — C50411 Malignant neoplasm of upper-outer quadrant of right female breast: Secondary | ICD-10-CM

## 2014-03-11 DIAGNOSIS — Z853 Personal history of malignant neoplasm of breast: Secondary | ICD-10-CM

## 2014-03-11 LAB — COMPREHENSIVE METABOLIC PANEL (CC13)
ALT: 24 U/L (ref 0–55)
AST: 29 U/L (ref 5–34)
Albumin: 3.2 g/dL — ABNORMAL LOW (ref 3.5–5.0)
Alkaline Phosphatase: 223 U/L — ABNORMAL HIGH (ref 40–150)
Anion Gap: 7 mEq/L (ref 3–11)
BILIRUBIN TOTAL: 1.09 mg/dL (ref 0.20–1.20)
BUN: 13.3 mg/dL (ref 7.0–26.0)
CALCIUM: 9.2 mg/dL (ref 8.4–10.4)
CO2: 27 mEq/L (ref 22–29)
Chloride: 108 mEq/L (ref 98–109)
Creatinine: 1.1 mg/dL (ref 0.6–1.1)
EGFR: 69 mL/min/{1.73_m2} — ABNORMAL LOW (ref >90)
GLUCOSE: 101 mg/dL (ref 70–140)
Potassium: 3.5 mEq/L (ref 3.5–5.1)
SODIUM: 142 meq/L (ref 136–145)
Total Protein: 7 g/dL (ref 6.4–8.3)

## 2014-03-11 LAB — CBC WITH DIFFERENTIAL/PLATELET
BASO%: 0.6 % (ref 0.0–2.0)
BASOS ABS: 0.1 10*3/uL (ref 0.0–0.1)
EOS%: 2.5 % (ref 0.0–7.0)
Eosinophils Absolute: 0.2 10*3/uL (ref 0.0–0.5)
HEMATOCRIT: 38.6 % (ref 34.8–46.6)
HEMOGLOBIN: 12.3 g/dL (ref 11.6–15.9)
LYMPH#: 1.8 10*3/uL (ref 0.9–3.3)
LYMPH%: 22.5 % (ref 14.0–49.7)
MCH: 28.2 pg (ref 25.1–34.0)
MCHC: 31.9 g/dL (ref 31.5–36.0)
MCV: 88.5 fL (ref 79.5–101.0)
MONO#: 0.5 10*3/uL (ref 0.1–0.9)
MONO%: 5.8 % (ref 0.0–14.0)
NEUT#: 5.5 10*3/uL (ref 1.5–6.5)
NEUT%: 68.6 % (ref 38.4–76.8)
Platelets: 220 10*3/uL (ref 145–400)
RBC: 4.36 10*6/uL (ref 3.70–5.45)
RDW: 14.9 % — ABNORMAL HIGH (ref 11.2–14.5)
WBC: 8.1 10*3/uL (ref 3.9–10.3)

## 2014-03-11 MED ORDER — ANASTROZOLE 1 MG PO TABS: 1.0000 mg | ORAL_TABLET | Freq: Every day | ORAL | Status: DC

## 2014-03-11 MED ORDER — ANASTROZOLE 1 MG PO TABS
1.0000 mg | ORAL_TABLET | Freq: Every day | ORAL | Status: DC
Start: 2014-03-11 — End: 2014-09-16

## 2014-03-11 MED ORDER — PREDNISONE 50 MG PO TABS: ORAL_TABLET | ORAL | Status: DC

## 2014-03-11 NOTE — Progress Notes (Signed)
[facility] NOTE  Patient Care Team: Helane Rima, MD as PCP - General (Family Medicine) Helane Rima, MD (Family Medicine) Truitt Merle, MD as Consulting Physician (Hematology) Erroll Luna, MD as Consulting Physician (General Surgery) Thea Silversmith, MD as Consulting Physician (Radiation Oncology) Trinda Pascal, NP as Nurse Practitioner (Nurse Practitioner)   Breast cancer of upper-outer quadrant of right female breast   Staging form: Breast, AJCC 7th Edition     Clinical: Stage IIB (T2, N1, M0) - Unsigned       Staging comments: Staged at breast conference on 11.18.15     Breast cancer of upper-outer quadrant of right female breast   01/12/2014 Mammogram 3.6cm lobulated solid mass in RUQ right breast, and an oval axillary node with cortex thickening.    01/13/2014 Initial Biopsy Right breast needle core biopsy: invasive ductal adenocarcinoma, ER90%(+), PR 90%(+) and HER2 (-). Ki 67 10%. right axillary node biopsy (+) IDA  (1/1)    01/14/2014 Initial Diagnosis Breast cancer of upper-outer quadrant of right female breast   OTHER RELATED PROBLEMS: 1. stage III left breast cancer in 1991, S/P mastectomy and adjuvant chemotherapy under Dr. Marko Plume.  CHIEF COMPLAINTS Follow-up of breast cancer  HISTORY OF INITIAL PRESENTING ILLNESS (01/19/2014) :  Danielle Oliver 54 y.o. female is here because of newly diagnosed right stage IIB breast cancer.  She was diagnosed with stage III left breast cancer in 1991 when she was 54 years old. Unfortunately her breast cancer diagnosis and treatment history was not available for review today. Apparently she had left mastectomy, and 4 cycles of adjuvant chemotherapy under Dr. Mariana Kaufman care at our cancer center. Patient does not recall the name of the chemotherapy medication. She was also offered a clinical trial of bone marrow transplant for her breast cancer and she declined. She was followed by January mammograms  and had no evidence of recurrence. Her last screening mammogram was in 2009 which was benign.  She noticed a lump in her right breast about 20 weeks ago. It was hard and the tender on palpitation. She denies any skin change or nipple discharge. She has no appetite change, weight loss, or other new symptoms. She had underwent a diagnostic mammogram on 01/12/2014, which showed a 3.6 cm lobulated solid mass in right upper quadrant of right breast, and an oval axillary lymph nodes was cortical thickening. 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 Her right axillary lymph node biopsy also showed invasive adenocarcinoma. She is here in our multidisciplinary breast clinic today to discuss further management.  INTERIM HISTORY: Danielle Oliver returns for follow-up. Unfortunately her breast surgery has been postponed due to her co-pay and financial issues. She will be scheduled to see different surgeon in the [facility]. She has not received any treatment for her breast cancer yet. She states she feels about the same since I saw her in mid November. She has mild intermittent pain at the right breast mass, no other new complaints. She has discussed her cancer diagnosis with her children.   MEDICAL HISTORY:  Past Medical History  Diagnosis Date  . Breast cancer 1991  . Hypertension   . Gout   . Arthritis     SURGICAL HISTORY: Past Surgical History  Procedure Laterality Date  . Mastectomy Left   . Tonsillectomy      SOCIAL HISTORY: History   Social History  .  Marital Status: Divorced    Spouse Name: N/A    Number of Children: 3  . Years of Education: N/A  She works as a Passenger transport manager. She has three children, age of 23, 41 and 51. She lives with her two children. His oldest child lives in [place].   GYN HISTORY: First menstrual period: 16 Last memstrual period: 2 years  ago G4P3, with one abortion.  She is not on hormonal replacement     Social History Main Topics  . Smoking status: Never Smoker   . Smokeless tobacco: Not on file  . Alcohol Use: 1.2 oz/week    2 Glasses of wine per week  . Drug Use: No  . Sexual Activity: Not on file      FAMILY HISTORY: Family History  Problem Relation Age of Onset  . Colon cancer Father 68's   No family history of breast cancer, or other type of malignancy.   ALLERGIES:  is allergic to iodinated diagnostic agents and penicillins.  MEDICATIONS:  Current Outpatient Prescriptions  Medication Sig Dispense Refill  . amLODipine-olmesartan (AZOR) 5-40 MG per tablet Take 1 tablet by mouth daily.    Marland Kitchen lovastatin (MEVACOR) 40 MG tablet Take 40 mg by mouth.    . fexofenadine (ALLEGRA) 180 MG tablet Take 180 mg by mouth daily as needed.     . montelukast (SINGULAIR) 10 MG tablet Take 1 tablet (10 mg total) by mouth at bedtime prn     No current facility-administered medications for this visit.    REVIEW OF SYSTEMS:   Constitutional: Denies fevers, chills or abnormal night sweats, (+) intentional 14 lbs weight loss in the past 8 months Eyes: Denies blurriness of vision, double vision or watery eyes Ears, nose, mouth, throat, and face: Denies mucositis or sore throat Respiratory: Denies cough, dyspnea or wheezes Cardiovascular: Denies palpitation, chest discomfort or lower extremity swelling Gastrointestinal:  Denies nausea, heartburn or change in bowel habits Skin: Denies abnormal skin rashes Lymphatics: Denies new lymphadenopathy or easy bruising Neurological:Denies numbness, tingling or new weaknesses Behavioral/Psych: Mood is stable, no new changes  All other systems were reviewed with the patient and are negative.  PHYSICAL EXAMINATION: ECOG PERFORMANCE STATUS: 0 - Asymptomatic  Filed Vitals:   03/11/14 0935  BP: 141/100  Pulse: 99  Temp: 98.2 F (36.8 C)  Resp: 21   Filed Weights   03/11/14  0935  Weight: 332 lb 3.2 oz (150.685 kg)    GENERAL:obese lady, alert, no distress and comfortable SKIN: skin color, texture, turgor are normal, no rashes or significant lesions EYES: normal, conjunctiva are pink and non-injected, sclera clear OROPHARYNX:no exudate, no erythema and lips, buccal mucosa, and tongue normal  NECK: supple, thyroid normal size, non-tender, without nodularity LYMPH:  no palpable lymphadenopathy in the cervical, axillary or inguinal LUNGS: clear to auscultation and percussion with normal breathing effort HEART: regular rate & rhythm and no murmurs and no lower extremity edema ABDOMEN:abdomen soft, non-tender and normal bowel sounds Musculoskeletal:no cyanosis of digits and no clubbing  PSYCH: alert & oriented x 3 with fluent speech NEURO: no focal motor/sensory deficits BREAST: no skin color change or nipple discharge. there is a 4X4 cm lump in the right upper quadrant of her right breast with some tenderness on palpitation, firm, and small palpable lymph nodes in the right axillary area with some tenderness. She is status post left mastectomy, no palpable mass or skin changes on the left chest.  LABORATORY DATA:  I have reviewed the data as listed  Lab Results  Component Value Date   WBC 10.0 01/19/2014   HGB 13.0 01/19/2014   HCT 40.5 01/19/2014   MCV 87.5 01/19/2014   PLT 330 01/19/2014    Recent Labs  01/19/14 1221  NA 143  K 3.5  CO2 27  GLUCOSE 99  BUN 11.5  CREATININE 0.9  CALCIUM 9.8  PROT 7.5  ALBUMIN 3.4*  AST 19  ALT 17  ALKPHOS 160*  BILITOT 0.82    RADIOGRAPHIC STUDIES: I have personally reviewed the outside radiological images and report as listed and agreed with the findings in the report.  Pathology 01/13/14 #1Right breast needle core biopsy Invasive ductal carcinoma  #2right axillary lymph node biopsy 1 lymph node positive for metastatic mammary carcinoma.  ER 90% positive PR 90% positive Ki-67 10% HER-2  negative  ASSESSMENT & PLAN:  Patient is a 54 year old post plus woman, with past medical history of stage III left breast cancer status post mastectomy and adjuvant chemotherapy, who was found to have a biopsy-proven T2N1 M0 stage IIb right breast invasive ductal carcinoma, ER/PR strongly positive and HER-2 negative, diagnosed on 01/13/2014.   1. Right breast invasive ductal carcinoma, c T2 N1 M0 stage IIB, ER positive PR positive HER-2 negative -It has been 2 months since her initial diagnosis, and she has not received any treatment. She is waiting for her surgical appointment with a new surgeon. She prefers to have her admitted mastectomy and bilateral reconstruction at the same time. She has seen plastic surgeon Dr. Migdalia Dk. -I recommend neoadjuvant or adjuvant chemotherapy giving her biopsy-proven lymph node metastasis. Given her prior possible anthracycline exposure, I would recommend Cytoxan and docetaxel regimen for total of 6 cycles. Patient agrees with chemotherapy, however once to have the surgery done first. She does not want new adjuvant chemotherapy. -Giving her delay of treatment, I recommend her to start neoadjuvant hormonal therapy as soon as possible. She is 2 years post menopausal, I recommend anastrozole 1 mg daily. I sent the prescription to your pharmacy today. She agrees to start as well as she receives it. -Giving the delay of her treatment, and prior history of stage III contralateral breast cancer, I recommend a staging CT scan as soon as possible to ruled out distant metastasis.  2. Prior history of stage III left breast cancer, that post mastectomy and adjuvant chemotherapy in 1990.  3. Hypertension and arthritis -Continue medication and follow up with primary care physician.  Plan -start Arimidex as soon as you receive it -CT next week -RTC in 3 weeks. If your surgery is scheduled within the next 3 weeks, I will see you in  2-3 weeks after your surgery.   All  questions were answered. The patient knows to call the clinic with any problems, questions or concerns. I spent 40 minutes counseling the patient face to face. The total time spent in the appointment was 60 minutes and more than 50% was on counseling.     Truitt Merle, MD 03/11/2014 9:56 AM

## 2014-03-11 NOTE — Telephone Encounter (Signed)
Gave avs & cal for Jan. °

## 2014-03-12 ENCOUNTER — Encounter: Payer: Self-pay | Admitting: Hematology

## 2014-03-14 ENCOUNTER — Telehealth: Payer: Self-pay | Admitting: *Deleted

## 2014-03-14 NOTE — Telephone Encounter (Signed)
Left message for a return phone call to reschedule patient's appointment with Dr. Burr Medico.  Awaiting patient response.

## 2014-03-15 ENCOUNTER — Telehealth: Payer: Self-pay | Admitting: *Deleted

## 2014-03-15 NOTE — Telephone Encounter (Signed)
Called and spoke with patient. Rescheduled appt. With Dr. Burr Medico to 04/25/14 at 930am with labs at [place].  Encouraged patient to call with any needs or concerns.

## 2014-03-18 ENCOUNTER — Ambulatory Visit (INDEPENDENT_AMBULATORY_CARE_PROVIDER_SITE_OTHER): Payer: Self-pay | Admitting: Surgery

## 2014-03-18 ENCOUNTER — Ambulatory Visit (HOSPITAL_COMMUNITY)
Admission: RE | Admit: 2014-03-18 | Discharge: 2014-03-18 | Disposition: A | Discharge status: Home / Self Care | Payer: BC Managed Care – PPO | Source: Ambulatory Visit | Attending: Hematology | Admitting: Hematology

## 2014-03-18 DIAGNOSIS — C50411 Malignant neoplasm of upper-outer quadrant of right female breast: Secondary | ICD-10-CM | POA: Insufficient documentation

## 2014-03-18 MED ORDER — IOHEXOL 300 MG/ML  SOLN
100.0000 mL | Freq: Once | INTRAMUSCULAR | Status: AC | PRN
  Administered 2014-03-18: 100 mL via INTRAVENOUS

## 2014-03-21 ENCOUNTER — Encounter: Payer: Self-pay | Admitting: Hematology

## 2014-03-21 NOTE — Progress Notes (Signed)
Faxed cancer forms to [employer] @ [phone] and [employer] @ [phone]

## 2014-03-25 ENCOUNTER — Inpatient Hospital Stay (HOSPITAL_COMMUNITY): Admission: RE | Admit: 2014-03-25 | Payer: BC Managed Care – PPO | Source: Ambulatory Visit

## 2014-03-28 ENCOUNTER — Other Ambulatory Visit: Payer: Self-pay | Admitting: *Deleted

## 2014-03-28 ENCOUNTER — Ambulatory Visit (HOSPITAL_BASED_OUTPATIENT_CLINIC_OR_DEPARTMENT_OTHER): Payer: BC Managed Care – PPO | Admitting: Hematology

## 2014-03-28 ENCOUNTER — Telehealth: Payer: Self-pay | Admitting: Hematology

## 2014-03-28 ENCOUNTER — Encounter: Payer: Self-pay | Admitting: Hematology

## 2014-03-28 VITALS — BP 139/81 | HR 100 | Temp 98.4°F | Resp 19 | Ht 67.0 in | Wt 333.1 lb

## 2014-03-28 DIAGNOSIS — M1990 Unspecified osteoarthritis, unspecified site: Secondary | ICD-10-CM

## 2014-03-28 DIAGNOSIS — Z853 Personal history of malignant neoplasm of breast: Secondary | ICD-10-CM

## 2014-03-28 DIAGNOSIS — Z170 Estrogen receptor positive status [ER+]: Secondary | ICD-10-CM

## 2014-03-28 DIAGNOSIS — I10 Essential (primary) hypertension: Secondary | ICD-10-CM

## 2014-03-28 DIAGNOSIS — C50411 Malignant neoplasm of upper-outer quadrant of right female breast: Secondary | ICD-10-CM

## 2014-03-28 DIAGNOSIS — M899 Disorder of bone, unspecified: Secondary | ICD-10-CM

## 2014-03-28 NOTE — Progress Notes (Signed)
[facility] NOTE  Patient Care Team: Helane Rima, MD as PCP - General (Family Medicine) Helane Rima, MD (Family Medicine) Truitt Merle, MD as Consulting Physician (Hematology) Erroll Luna, MD as Consulting Physician (General Surgery) Thea Silversmith, MD as Consulting Physician (Radiation Oncology) Trinda Pascal, NP as Nurse Practitioner (Nurse Practitioner)   Breast cancer of upper-outer quadrant of right female breast   Staging form: Breast, AJCC 7th Edition     Clinical: Stage IIB (T2, N1, M0) - Unsigned       Staging comments: Staged at breast conference on 11.18.15     Breast cancer of upper-outer quadrant of right female breast   01/12/2014 Mammogram 3.6cm lobulated solid mass in RUQ right breast, and an oval axillary node with cortex thickening.    01/13/2014 Initial Biopsy Right breast needle core biopsy: invasive ductal adenocarcinoma, ER90%(+), PR 90%(+) and HER2 (-). Ki 67 10%. right axillary node biopsy (+) IDA  (1/1)    01/14/2014 Initial Diagnosis Breast cancer of upper-outer quadrant of right female breast   OTHER RELATED PROBLEMS: 1. stage III left breast cancer in 1991, S/P mastectomy and adjuvant chemotherapy under Dr. Marko Oliver.  CHIEF COMPLAINTS Follow-up of breast cancer  CURRENT THERAPY:  Arimidex 82m daily started 03/13/2014  HISTORY OF INITIAL PRESENTING ILLNESS (01/19/2014) :  Danielle WILMOTT54y.o. female is here because of newly diagnosed right stage IIB breast cancer.  She was diagnosed with stage III left breast cancer in 1991 when she was 54years old. Unfortunately her breast cancer diagnosis and treatment history was not available for review today. Apparently she had left mastectomy, and 4 cycles of adjuvant chemotherapy under Dr. LMariana Kaufmancare at our cancer center. Patient does not recall the name of the chemotherapy medication. She was also offered a clinical trial of bone marrow transplant for her breast  cancer and she declined. She was followed by January mammograms and had no evidence of recurrence. Her last screening mammogram was in 2009 which was benign.  She noticed a lump in her right breast about 20 weeks ago. It was hard and the tender on palpitation. She denies any skin change or nipple discharge. She has no appetite change, weight loss, or other new symptoms. She had underwent a diagnostic mammogram on 01/12/2014, which showed a 3.6 cm lobulated solid mass in right upper quadrant of right breast, and an oval axillary lymph nodes was cortical thickening. 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 Her right axillary lymph node biopsy also showed invasive adenocarcinoma. She is here in our multidisciplinary breast clinic today to discuss further management.  INTERIM HISTORY: ANaralyreturns for follow-up and discuss CT scan result. She has started Arimidex on 03/13/2014. She tolerates it very well, without significant side effects. She has mild joint and muscular pain, which has not changed since she started Arimidex. No other new symptoms.   MEDICAL HISTORY:  Past Medical History  Diagnosis Date  . Breast cancer 1991  . Hypertension   . Gout   . Arthritis     SURGICAL HISTORY: Past Surgical History  Procedure Laterality Date  . Mastectomy Left   . Tonsillectomy      SOCIAL HISTORY: History   Social History  . Marital Status: Divorced    Spouse Name: N/A    Number of Children: 3  . Years of Education: N/A  She works as a  auditor. She has three children, age of 53, 42 and 74. She lives with her two children. His oldest child lives in [place].   GYN HISTORY: First menstrual period: 52 Last memstrual period: 2 years ago G4P3, with one abortion.  She is not on hormonal replacement     Social History Main Topics  . Smoking status: Never Smoker   . Smokeless  tobacco: Not on file  . Alcohol Use: 1.2 oz/week    2 Glasses of wine per week  . Drug Use: No  . Sexual Activity: Not on file      FAMILY HISTORY: Family History  Problem Relation Age of Onset  . Colon cancer Father 89's   No family history of breast cancer, or other type of malignancy.   ALLERGIES:  is allergic to iodinated diagnostic agents and penicillins.  MEDICATIONS:  Current Outpatient Prescriptions  Medication Sig Dispense Refill  . amLODipine-olmesartan (AZOR) 5-40 MG per tablet Take 1 tablet by mouth daily.    Marland Kitchen anastrozole (ARIMIDEX) 1 MG tablet Take 1 tablet (1 mg total) by mouth daily. 30 tablet 5  . fexofenadine (ALLEGRA) 180 MG tablet Take 180 mg by mouth daily as needed for allergies.     Marland Kitchen lovastatin (MEVACOR) 40 MG tablet Take 40 mg by mouth.    . montelukast (SINGULAIR) 10 MG tablet Take 1 tablet (10 mg total) by mouth at bedtime prn    . predniSONE (DELTASONE) 50 MG tablet Take one tab 13 hours, 7 hours, & 1 hour pre procedure (Patient not taking: Reported on 03/24/2014) 3 tablet 0   No current facility-administered medications for this visit.    REVIEW OF SYSTEMS:   Constitutional: Denies fevers, chills or abnormal night sweats, (+) intentional 14 lbs weight loss in the past 8 months Eyes: Denies blurriness of vision, double vision or watery eyes Ears, nose, mouth, throat, and face: Denies mucositis or sore throat Respiratory: Denies cough, dyspnea or wheezes Cardiovascular: Denies palpitation, chest discomfort or lower extremity swelling Gastrointestinal:  Denies nausea, heartburn or change in bowel habits Skin: Denies abnormal skin rashes Lymphatics: Denies new lymphadenopathy or easy bruising Neurological:Denies numbness, tingling or new weaknesses Behavioral/Psych: Mood is stable, no new changes  All other systems were reviewed with the patient and are negative.  PHYSICAL EXAMINATION: ECOG PERFORMANCE STATUS: 0 - Asymptomatic  Filed Vitals:    03/28/14 1047  BP: 139/81  Pulse: 100  Temp: 98.4 F (36.9 C)  Resp: 19   Filed Weights   03/28/14 1047  Weight: 333 lb 1.6 oz (151.093 kg)    GENERAL:obese lady, alert, no distress and comfortable SKIN: skin color, texture, turgor are normal, no rashes or significant lesions EYES: normal, conjunctiva are pink and non-injected, sclera clear OROPHARYNX:no exudate, no erythema and lips, buccal mucosa, and tongue normal  NECK: supple, thyroid normal size, non-tender, without nodularity LYMPH:  no palpable lymphadenopathy in the cervical, axillary or inguinal LUNGS: clear to auscultation and percussion with normal breathing effort HEART: regular rate & rhythm and no murmurs and no lower extremity edema ABDOMEN:abdomen soft, non-tender and normal bowel sounds Musculoskeletal:no cyanosis of digits and no clubbing  PSYCH: alert & oriented x 3 with fluent speech NEURO: no focal motor/sensory deficits BREAST: no skin color change or nipple discharge. there is a 4X4 cm lump in the right upper quadrant of her right breast with some tenderness on palpitation, firm, and small palpable lymph nodes in the right axillary area with some tenderness. She is status post left  mastectomy, no palpable mass or skin changes on the left chest.  LABORATORY DATA:  I have reviewed the data as listed Lab Results  Component Value Date   WBC 8.1 03/11/2014   HGB 12.3 03/11/2014   HCT 38.6 03/11/2014   MCV 88.5 03/11/2014   PLT 220 03/11/2014    Recent Labs  01/19/14 1221 03/11/14 1039  NA 143 142  K 3.5 3.5  CO2 27 27  GLUCOSE 99 101  BUN 11.5 13.3  CREATININE 0.9 1.1  CALCIUM 9.8 9.2  PROT 7.5 7.0  ALBUMIN 3.4* 3.2*  AST 19 29  ALT 17 24  ALKPHOS 160* 223*  BILITOT 0.82 1.09    RADIOGRAPHIC STUDIES: CT chest, abdomen and pelvis w contrast 03/18/2014 IMPRESSION: 1. No evidence for soft tissue metastasis within the chest abdomen or pelvis. 2. Right breast mass containing clip consistent  with primary breast carcinoma. The patient is status post left mastectomy. 3. 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  Pathology 01/13/14 #1Right breast needle core biopsy Invasive ductal carcinoma  #2right axillary lymph node biopsy 1 lymph node positive for metastatic mammary carcinoma.  ER 90% positive PR 90% positive Ki-67 10% HER-2 negative  ASSESSMENT & PLAN:  Patient is a 54 year old post plus woman, with past medical history of stage III left breast cancer status post mastectomy and adjuvant chemotherapy, who was found to have a biopsy-proven T2N1 M0 stage IIb right breast invasive ductal carcinoma, ER/PR strongly positive and HER-2 negative, diagnosed on 01/13/2014.   1. 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 Unfortunately, it shows diffuse bone lesions which are highly suspicious for metastasis from her breast cancer. No other visceral lesions. -I will obtain a bone scan, and a CT-guided pelvic bone lesion biopsy to confirm the metastasis, and repeat ER/PR and HER-2 on metastatic lesions. -She was quite devastated with the news of metastasis, I given him emotional support. She also met our navigator Danielle Oliver after my visit. I also offered her an opportunity to discuss with our Education officer, museum. I discussed this is treatable disease, with a strong ER/PR positivity, HER-2 negative, and bone metastases only, I recommend hormonal therapy as a first-line therapy. -She is scheduled for breast surgery later this week. I have contacted Dr. Brantley Stage and he agrees to cancel the surgery.  2. Prior history of stage III left breast cancer, that post mastectomy and adjuvant chemotherapy in 1990.  3. Hypertension and arthritis -Continue medication and follow up with primary care physician.  Plan -continue Arimidex   -bone scan and IR bone biopsy -RTC 2 weeks  -social work referral next time    All questions were answered. The patient knows to call the clinic with any problems, questions or concerns. I spent 30 minutes counseling the patient face to face. The total time spent in the appointment was 40 minutes and more than 50% was on counseling.     Truitt Merle, MD 03/28/2014 11:21 AM

## 2014-03-28 NOTE — Telephone Encounter (Signed)
gv and rpinted appt sched and avs for pt for Feb

## 2014-03-30 ENCOUNTER — Inpatient Hospital Stay (HOSPITAL_COMMUNITY): Admission: RE | Admit: 2014-03-30 | Payer: BC Managed Care – PPO | Source: Ambulatory Visit

## 2014-03-31 ENCOUNTER — Encounter (HOSPITAL_COMMUNITY): Admission: RE | Payer: Self-pay | Source: Ambulatory Visit

## 2014-03-31 ENCOUNTER — Ambulatory Visit (HOSPITAL_COMMUNITY): Admission: RE | Admit: 2014-03-31 | Payer: BC Managed Care – PPO | Source: Ambulatory Visit | Admitting: Surgery

## 2014-03-31 SURGERY — MASTECTOMY, MODIFIED RADICAL
Anesthesia: General | Laterality: Right

## 2014-04-01 ENCOUNTER — Other Ambulatory Visit: Payer: BC Managed Care – PPO

## 2014-04-01 ENCOUNTER — Ambulatory Visit: Payer: BC Managed Care – PPO | Admitting: Hematology

## 2014-04-07 ENCOUNTER — Telehealth: Payer: Self-pay | Admitting: Hematology

## 2014-04-07 ENCOUNTER — Telehealth: Payer: Self-pay | Admitting: *Deleted

## 2014-04-07 NOTE — Telephone Encounter (Signed)
Received call from patient stating she has a virus and does not feel like coming in tomorrow for her bone scan.  She will call me back to reschedule.  She has an appointment with Dr. [name] at [facility] on 04/13/14.  She would like to cancel her appointment with Dr. Burr Medico on 04/14/14.  She is scheduled as well for 04/25/14 with Dr. Burr Medico and she will keep that appointment unless she decides to transfer her care to [facility].  Encouraged patient to call with any needs or concerns.

## 2014-04-07 NOTE — Telephone Encounter (Signed)
Faxed pt medical records to [facility] will be fedex'ed

## 2014-04-08 ENCOUNTER — Encounter (HOSPITAL_COMMUNITY): Payer: BC Managed Care – PPO

## 2014-04-14 ENCOUNTER — Ambulatory Visit: Payer: BC Managed Care – PPO | Admitting: Hematology

## 2014-04-19 ENCOUNTER — Telehealth: Payer: Self-pay | Admitting: *Deleted

## 2014-04-19 NOTE — Telephone Encounter (Signed)
Spoke with patient and she has seen the MD at [facility] but is still undecided on whether or not she wants to transfer her care there.  She states she is going to make a decision by Friday and would call me back and let me know.  Encouraged her to call me with any needs or concerns.  Informed I would be here if she needed anything even if she transferred her care to [facility].  Patient verbalized understanding.

## 2014-04-25 ENCOUNTER — Ambulatory Visit: Payer: BC Managed Care – PPO | Admitting: Hematology

## 2014-04-25 ENCOUNTER — Other Ambulatory Visit: Payer: BC Managed Care – PPO

## 2014-06-01 ENCOUNTER — Telehealth: Payer: Self-pay | Admitting: *Deleted

## 2014-06-01 NOTE — Telephone Encounter (Signed)
Left message to follow up with patient.  Awaiting patient response.

## 2014-09-16 ENCOUNTER — Telehealth: Payer: Self-pay | Admitting: *Deleted

## 2014-09-16 ENCOUNTER — Other Ambulatory Visit: Payer: Self-pay | Admitting: *Deleted

## 2014-09-16 DIAGNOSIS — C50411 Malignant neoplasm of upper-outer quadrant of right female breast: Secondary | ICD-10-CM

## 2014-09-16 MED ORDER — ANASTROZOLE 1 MG PO TABS: 1.0000 mg | ORAL_TABLET | Freq: Every day | ORAL | Status: DC

## 2014-09-16 NOTE — Telephone Encounter (Signed)
Received call from patient stating she needed a refill on her anastrozole.  She states she is sorry for not calling me back.  She has needed time to herself and was not ready to talk. She did get a second opinion at [facility] but has chosen not to do anything except for anastrozole until now.  She states she is ready to get an appointment to follow up but is requesting a different physician.  Informed her we would refill anastrozole once and I would get her an appointment with one of our other physicians.  I will call her back with an appointment.  She verbalized understanding.

## 2014-09-20 ENCOUNTER — Telehealth: Payer: Self-pay | Admitting: *Deleted

## 2014-09-20 ENCOUNTER — Other Ambulatory Visit: Payer: Self-pay | Admitting: Oncology

## 2014-09-20 DIAGNOSIS — C50411 Malignant neoplasm of upper-outer quadrant of right female breast: Secondary | ICD-10-CM

## 2014-09-20 NOTE — Telephone Encounter (Signed)
Spoke with patient and confirmed lab appointment for 7/20 at 415pm and Dr. Jana Hakim appointment for 09/23/14 at 1230pm.

## 2014-09-20 NOTE — Telephone Encounter (Signed)
Left message to schedule patient with Dr. Jana Hakim.  Awaiting patient response.

## 2014-09-21 ENCOUNTER — Other Ambulatory Visit (HOSPITAL_BASED_OUTPATIENT_CLINIC_OR_DEPARTMENT_OTHER): Payer: BC Managed Care – PPO

## 2014-09-21 DIAGNOSIS — C50411 Malignant neoplasm of upper-outer quadrant of right female breast: Secondary | ICD-10-CM

## 2014-09-21 LAB — CBC WITH DIFFERENTIAL/PLATELET
BASO%: 0.5 % (ref 0.0–2.0)
BASOS ABS: 0 10*3/uL (ref 0.0–0.1)
EOS%: 4.4 % (ref 0.0–7.0)
Eosinophils Absolute: 0.3 10*3/uL (ref 0.0–0.5)
HCT: 38.4 % (ref 34.8–46.6)
HGB: 12.3 g/dL (ref 11.6–15.9)
LYMPH#: 1.9 10*3/uL (ref 0.9–3.3)
LYMPH%: 24.8 % (ref 14.0–49.7)
MCH: 27.9 pg (ref 25.1–34.0)
MCHC: 32 g/dL (ref 31.5–36.0)
MCV: 87.1 fL (ref 79.5–101.0)
MONO#: 0.5 10*3/uL (ref 0.1–0.9)
MONO%: 7 % (ref 0.0–14.0)
NEUT#: 4.9 10*3/uL (ref 1.5–6.5)
NEUT%: 63.3 % (ref 38.4–76.8)
Platelets: 291 10*3/uL (ref 145–400)
RBC: 4.41 10*6/uL (ref 3.70–5.45)
RDW: 14.1 % (ref 11.2–14.5)
WBC: 7.8 10*3/uL (ref 3.9–10.3)

## 2014-09-21 LAB — COMPREHENSIVE METABOLIC PANEL (CC13)
ALBUMIN: 3.4 g/dL — AB (ref 3.5–5.0)
ALT: 13 U/L (ref 0–55)
ANION GAP: 10 meq/L (ref 3–11)
AST: 14 U/L (ref 5–34)
Alkaline Phosphatase: 145 U/L (ref 40–150)
BUN: 13.1 mg/dL (ref 7.0–26.0)
CALCIUM: 9.4 mg/dL (ref 8.4–10.4)
CO2: 24 meq/L (ref 22–29)
CREATININE: 0.8 mg/dL (ref 0.6–1.1)
Chloride: 107 mEq/L (ref 98–109)
GLUCOSE: 122 mg/dL (ref 70–140)
Potassium: 3.9 mEq/L (ref 3.5–5.1)
SODIUM: 141 meq/L (ref 136–145)
TOTAL PROTEIN: 7 g/dL (ref 6.4–8.3)
Total Bilirubin: 0.64 mg/dL (ref 0.20–1.20)

## 2014-09-22 ENCOUNTER — Other Ambulatory Visit: Payer: Self-pay | Admitting: Oncology

## 2014-09-22 LAB — VITAMIN D 25 HYDROXY (VIT D DEFICIENCY, FRACTURES): VIT D 25 HYDROXY: 8 ng/mL — AB (ref 30–100)

## 2014-09-22 LAB — CANCER ANTIGEN 27.29: CA 27.29: 163 U/mL — AB (ref 0–39)

## 2014-09-23 ENCOUNTER — Telehealth: Payer: Self-pay | Admitting: Oncology

## 2014-09-23 ENCOUNTER — Encounter: Payer: Self-pay | Admitting: Oncology

## 2014-09-23 ENCOUNTER — Encounter: Payer: Self-pay | Admitting: *Deleted

## 2014-09-23 ENCOUNTER — Ambulatory Visit (HOSPITAL_BASED_OUTPATIENT_CLINIC_OR_DEPARTMENT_OTHER): Payer: BC Managed Care – PPO | Admitting: Oncology

## 2014-09-23 ENCOUNTER — Other Ambulatory Visit: Payer: Self-pay | Admitting: Oncology

## 2014-09-23 VITALS — BP 143/70 | HR 96 | Temp 98.3°F | Resp 20 | Ht 67.0 in | Wt 352.3 lb

## 2014-09-23 DIAGNOSIS — C773 Secondary and unspecified malignant neoplasm of axilla and upper limb lymph nodes: Secondary | ICD-10-CM

## 2014-09-23 DIAGNOSIS — E559 Vitamin D deficiency, unspecified: Secondary | ICD-10-CM

## 2014-09-23 DIAGNOSIS — C7951 Secondary malignant neoplasm of bone: Secondary | ICD-10-CM

## 2014-09-23 DIAGNOSIS — Z170 Estrogen receptor positive status [ER+]: Secondary | ICD-10-CM

## 2014-09-23 DIAGNOSIS — C50411 Malignant neoplasm of upper-outer quadrant of right female breast: Secondary | ICD-10-CM

## 2014-09-23 MED ORDER — VITAMIN D 1000 UNITS PO TABS: 1000.0000 [IU] | ORAL_TABLET | Freq: Every day | ORAL | Status: DC

## 2014-09-23 MED ORDER — PALBOCICLIB 125 MG PO CAPS: 125.0000 mg | ORAL_CAPSULE | Freq: Every day | ORAL | Status: DC

## 2014-09-23 NOTE — Telephone Encounter (Signed)
Gave avs & calendar for August/September °

## 2014-09-23 NOTE — Progress Notes (Signed)
Visit  [place]  [phone] [phone]     ID: Danielle Oliver DOB: [birth year]  MR#: [id number]  YTK#:[id number]  Patient Care Team: Danielle Rima, MD as PCP - General (Family Medicine) Danielle Luna, MD as Consulting Physician (General Surgery) Danielle Silversmith, MD as Consulting Physician (Radiation Oncology) Danielle Bouche, NP as Nurse Practitioner (Nurse Practitioner) Danielle Cruel, MD as Consulting Physician (Oncology) PCP: Danielle Rima, MD GYN: OTHER MD: Danielle Heck MD  CHIEF COMPLAINT: Stage IV breast cancer  CURRENT TREATMENT: Anastrozole   BREAST CANCER HISTORY: From Danielle Oliver 03/11/2014 intake note:  "She was diagnosed with stage III left breast cancer in 1991 when she was 54 years old. Unfortunately her breast cancer diagnosis and treatment history was not available for review today. Apparently she had left mastectomy, and 4 cycles of adjuvant chemotherapy under Danielle. Mariana Oliver care at our cancer center. Patient does not recall the name of the chemotherapy medication. She was also offered a clinical trial of bone marrow transplant for her breast cancer and she declined. She was followed by January mammograms and had no evidence of recurrence. Her last screening mammogram was in 2009 which was benign.  She noticed a lump in her right breast about 20 weeks ago. It was hard and the tender on palpitation. She denies any skin change or nipple discharge. She has no appetite change, weight loss, or other new symptoms. She had underwent a diagnostic mammogram on 01/12/2014, which showed a 3.6 cm lobulated solid mass in right upper quadrant of right breast, and an oval axillary lymph nodes was cortical thickening. 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 Her right axillary lymph node biopsy also showed invasive  adenocarcinoma."  Her subsequent history is as detailed below  INTERVAL HISTORY: Danielle Oliver returns today to the breast clinic for follow-up of her stage IV estrogen receptor positive breast cancer. She is establishing herself on my service today.  After meeting with Danielle. Burr Oliver she sought a second opinion at [facility] but had no biopsies or scans there. She never did have a biopsy of one of her bony metastatic sites. Also she was offered genetics counseling but refused. She tells me "I was not ready". She was started on anastrozole in January and is tolerating that well, with some hot flashes and night sweats, but no significant problems with vaginal dryness. She obtains it at a good price  REVIEW OF SYSTEMS: The only place where she hurts his her knees. This is not a new problem. She does not exercise regularly. She would like to do some swimming and we discussed her going to the aquatic Center with her daughter, who enjoys going there. A detailed review of systems today was otherwise entirely negative.  PAST MEDICAL HISTORY: Past Medical History  Diagnosis Date  . Breast cancer   . Hypertension   . Gout   . Arthritis   . Morbid obesity with body mass index of 50.0-59.9 in adult     PAST SURGICAL HISTORY: Past Surgical History  Procedure Laterality Date  . Mastectomy Left   . Tonsillectomy      FAMILY HISTORY Family History  Problem Relation Age of Onset  . Colon cancer Father    the patient's father died at 80 from colon cancer which was diagnosed shortly before his death. The patient's mother is living, age 31 as of July 2016. The  patient is an only child. The only other cancer in the family to her knowledge is a paternal great aunt who developed breast cancer in her late 79s. There is no history of ovarian cancer and no other history of colon cancer in the family to her knowledge  GYNECOLOGIC HISTORY:  No LMP recorded. Patient is postmenopausal. menarche age 86 first live birth  age 89 the patient is [place] P3. She stopped having periods in 2013. She did not take hormone replacement.   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 She is divorced. At home is just she and her son Danielle Oliver, 55 years old. Son Danielle Oliver, 27, lives in [name] where he works as a Fish farm manager. Son Danielle Oliver 10, 25, lives in [place] and is working on a Oceanographer in counseling at [employer] and [employer]. The patient has one granddaughter, Danielle Oliver, in [name]. She is not a church attender    ADVANCED DIRECTIVES:    HEALTH MAINTENANCE: History  Substance Use Topics  . Smoking status: Never Smoker   . Smokeless tobacco: Not on file  . Alcohol Use: 1.2 oz/week    2 Glasses of wine per week     Colonoscopy: remote/ Danielle Oliver  PAP: 2014  Bone density:  Lipid panel:  Allergies  Allergen Reactions  . Iodinated Diagnostic Agents Anaphylaxis  . Penicillins Rash    Current Outpatient Prescriptions  Medication Sig Dispense Refill  . amLODipine-olmesartan (AZOR) 5-40 MG per tablet Take 1 tablet by mouth daily.    Marland Kitchen anastrozole (ARIMIDEX) 1 MG tablet Take 1 tablet (1 mg total) by mouth daily. 30 tablet 0  . fexofenadine (ALLEGRA) 180 MG tablet Take 180 mg by mouth daily as needed for allergies.     Marland Kitchen lovastatin (MEVACOR) 40 MG tablet Take 40 mg by mouth.    . montelukast (SINGULAIR) 10 MG tablet Take 1 tablet (10 mg total) by mouth at bedtime prn    . predniSONE (DELTASONE) 50 MG tablet Take one tab 13 hours, 7 hours, & 1 hour pre procedure (Patient not taking: Reported on 03/24/2014) 3 tablet 0   No current facility-administered medications for this visit.    OBJECTIVE: Middle-aged African-American woman in no acute distress Filed Vitals:   09/23/14 1243  BP: 143/70  Pulse: 96  Temp: 98.3 F (36.8 C)  Resp: 20     Body mass index is 55.16 kg/(m^2).    ECOG FS:0 - Asymptomatic  Ocular: Sclerae unicteric, pupils equal, round and reactive to light Ear-nose-throat:  Oropharynx clear and moist Lymphatic: No cervical or supraclavicular adenopathy Lungs no rales or rhonchi, good excursion bilaterally Heart regular rate and rhythm, no murmur appreciated Abd soft, obese, nontender, positive bowel sounds MSK no focal spinal tenderness, no joint edema Neuro: non-focal, well-oriented, appropriate affect Breasts: There is an easily palpable mass in the lower outer/lateral aspect of the right breast. By palpation it measures between 2 and 3 cm. I do not see any skin changes or nipple changes of concern. The right axilla is benign. The left breast is unremarkable   LAB RESULTS:  CMP     Component Value Date/Time   NA 141 09/21/2014 1616   NA 140 11/09/2010 0921   K 3.9 09/21/2014 1616   K 4.6 11/09/2010 0921   CL 104 11/09/2010 0921   CO2 24 09/21/2014 1616   GLUCOSE 122 09/21/2014 1616   GLUCOSE 111* 11/09/2010 0921   BUN 13.1 09/21/2014 1616   BUN 22 11/09/2010 6734  CREATININE 0.8 09/21/2014 1616   CREATININE 1.10 11/09/2010 0921   CALCIUM 9.4 09/21/2014 1616   PROT 7.0 09/21/2014 1616   ALBUMIN 3.4* 09/21/2014 1616   AST 14 09/21/2014 1616   ALT 13 09/21/2014 1616   ALKPHOS 145 09/21/2014 1616   BILITOT 0.64 09/21/2014 1616    INo results found for: SPEP, UPEP  Lab Results  Component Value Date   WBC 7.8 09/21/2014   NEUTROABS 4.9 09/21/2014   HGB 12.3 09/21/2014   HCT 38.4 09/21/2014   MCV 87.1 09/21/2014   PLT 291 09/21/2014      Chemistry      Component Value Date/Time   NA 141 09/21/2014 1616   NA 140 11/09/2010 0921   K 3.9 09/21/2014 1616   K 4.6 11/09/2010 0921   CL 104 11/09/2010 0921   CO2 24 09/21/2014 1616   BUN 13.1 09/21/2014 1616   BUN 22 11/09/2010 0921   CREATININE 0.8 09/21/2014 1616   CREATININE 1.10 11/09/2010 0921      Component Value Date/Time   CALCIUM 9.4 09/21/2014 1616   ALKPHOS 145 09/21/2014 1616   AST 14 09/21/2014 1616   ALT 13 09/21/2014 1616   BILITOT 0.64 09/21/2014 1616       Lab  Results  Component Value Date   LABCA2 163* 09/21/2014    No components found for: [id number]  No results for input(s): INR in the last 168 hours.  Urinalysis    Component Value Date/Time   LABSPEC 1.015 04/27/2007 1654   PHURINE 6.0 04/27/2007 1654   GLUCOSEU NEGATIVE 04/27/2007 1654   HGBUR NEGATIVE 04/27/2007 1654   BILIRUBINUR NEGATIVE 04/27/2007 1654   KETONESUR NEGATIVE 04/27/2007 1654   PROTEINUR NEGATIVE 04/27/2007 1654   UROBILINOGEN 0.2 04/27/2007 1654   NITRITE NEGATIVE 04/27/2007 1654   LEUKOCYTESUR  04/27/2007 1654    NEGATIVE Biochemical Testing Only. Please order routine urinalysis from main lab if confirmatory testing is needed.    CLINICAL DATA: New diagnosis of right breast cancer. Remote history of left breast cancer.  EXAM: CT CHEST, ABDOMEN, AND PELVIS WITH CONTRAST  TECHNIQUE: Multidetector CT imaging of the chest, abdomen and pelvis was performed following the standard protocol during bolus administration of intravenous contrast.  CONTRAST: 129m OMNIPAQUE IOHEXOL 300 MG/ML SOLN  COMPARISON: None  FINDINGS: CT CHEST FINDINGS  Mediastinum: The heart size appears normal. There is no pericardial effusion. The trachea is patent and appears midline. Normal appearance of the esophagus. There is no mediastinal or hilar adenopathy identified.  Lungs/Pleura: No pleural effusion. No airspace consolidation or atelectasis identified. No suspicious pulmonary nodule or mass noted.  Musculoskeletal: Right breast mass containing clip measures 3.5 cm. Status post Left mastectomy. Extensive, multi focal sclerotic bone metastases are identified. Lesions are identified throughout the ribs spine and sternum.  CT ABDOMEN AND PELVIS FINDINGS  Hepatobiliary: No focal liver abnormality. Stones are identified within the gallbladder. The largest measures 1.3 cm no biliary dilatation.  Pancreas: Normal.  Spleen: Normal.  Adrenals/Urinary  Tract: The adrenal glands are normal. Unremarkable appearance of both kidneys. The urinary bladder appears normal. Uterus and adnexal structures are unremarkable.  Stomach/Bowel: The stomach is within normal limits. The small bowel loops have a normal course and caliber. No obstruction. Normal appearance of the colon. The appendix is visualized and appears normal.  Vascular/Lymphatic: Normal appearance of the abdominal aorta. No enlarged retroperitoneal or mesenteric adenopathy. No enlarged pelvic or inguinal lymph nodes.  Reproductive: The uterus and adnexal structures are unremarkable.  Other: There is no ascites or focal fluid collections within the abdomen or pelvis. There is a periumbilical hernia which contains fat only measuring 4.9 cm.  Musculoskeletal: Diffuse, multi focal sclerotic bone metastases are identified throughout the lumbar spine and pelvis. No pathologic fractures identified.  IMPRESSION: 1. No evidence for soft tissue metastasis within the chest abdomen or pelvis. 2. Right breast mass containing clip consistent with primary breast carcinoma. The patient is status post left mastectomy. 3. 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   Electronically Signed  By: Kerby Moors M.D.  On: 03/18/2014 17:13STUDIES:  ASSESSMENT: 54 y.o. [place] woman  (1) status post left mastectomy in 1991 followed by adjuvant chemotherapy 4, no further details available  (2) status post right breast biopsy 01/13/2014 for a  cT2 p N1, stage IIB invasive ductal carcinoma, estrogen and progesterone receptor positive, HER-2 negative, with an MIB-1 of 10%.  METASTATIC DISEASE: CT scan 03/18/2014 shows multiple sclerotic bone mets but no visceral disease  (1) no bone biopsy obtained to this point  (b) CA 27-29 is informative  (3) anastrozole started January 2016  (a) to add palbociclib  PLAN: We  spent the better part of today's hour-long appointment discussing the biology of breast cancer in general, and the specifics of the patient's tumor in particular. She understands that stage IV breast cancer is not curable with our current knowledge base. The goal of treatment is control. The strategy of treatment is to do only the minimum necessary to control the growth of the tumor so that the patient can have as normal a life as possible. There is no survival advantage in treating aggressively if treating less aggressively results in tumor control. With this strategy stage IV breast cancer in many cases can function as a "chronic illness": something that cannot be quite gotten rid of but can be controlled for an indefinite period of time  She is tolerating anastrozole well and she is obtaining it had a good price. We discussed Palbociclib, and she understands that that can significantly increase the time to progression. We discussed the possible toxicities, side effects and complications of that medication. We will try to obtain it for her.  We discussed genetics. She understands if she were found to have a BRCA mutation we would want to remove her ovaries, since we cannot screen for ovarian cancer and she would be at significant risk of developing that cancer. This is important also for her daughter and other family members. She agrees to genetic counseling and I have placed that referral.  I also mentioned her very low vitamin D level. We do not know that this is causative but it is certainly associated with breast cancer and I have recommended she start vitamin D supplementation at 1000 mg daily. I also recommended an exercise program which she tells me is likely to be swimming.  At this point we need to find out exactly where we are as far as her disease is concerned. She will have right mammography and ultrasonography and also a baseline bone scan.  I am not planning on a right lumpectomy at this  point since that is the only site of measurable disease we have, but certainly if there is disease progression in the right breast we would proceed to surgery to avoid uncontrolled local recurrence.  Finally I reassured Tayja that patients who have bone only disease generally do considerably better than those who have visceral metastases at presentation.Carliyah has a  good understanding of the overall plan. She agrees with it. She knows the goal of treatment in her case is cure. She will call with any problems that may develop before her next visit here.  Danielle Cruel, MD   09/23/2014 1:42 PM Medical Oncology and Hematology [facility] [address] [place], [place] [address] Tel. [phone]    Fax. [phone]

## 2014-10-06 ENCOUNTER — Other Ambulatory Visit: Payer: BC Managed Care – PPO

## 2014-10-06 ENCOUNTER — Encounter: Payer: BC Managed Care – PPO | Admitting: Genetic Counselor

## 2014-10-17 ENCOUNTER — Other Ambulatory Visit: Payer: Self-pay | Admitting: *Deleted

## 2014-10-17 ENCOUNTER — Other Ambulatory Visit: Payer: Self-pay | Admitting: Oncology

## 2014-10-17 ENCOUNTER — Telehealth: Payer: Self-pay | Admitting: *Deleted

## 2014-10-17 MED ORDER — PALBOCICLIB 125 MG PO CAPS: 125.0000 mg | ORAL_CAPSULE | Freq: Every day | ORAL | Status: DC

## 2014-10-17 NOTE — Telephone Encounter (Signed)
Per MD review this RN followed up on pt's staging and prescription plan per visit on 09/23/2014.  Pt was a no show for genetic appointment on 10/07/2014.  Bone scan is scheduled for 10/20/2014.  Ibrance prescription was declined by [facility] pharmacy with documentation that order was sent to [facility].  Per [facility] - prescription not in their system- no notes from managed care per prescription.  This RN obtained new prescription and will fax directly to [facility].

## 2014-10-18 NOTE — Telephone Encounter (Signed)
Clarene Critchley called from [facility] reporting No prior authorization required for Ibrance.  Co-pay will cost $10.00.  [facility] will contact patient to set up shipment.

## 2014-10-19 ENCOUNTER — Telehealth: Payer: Self-pay

## 2014-10-19 NOTE — Telephone Encounter (Signed)
Returned call to [facility].  LMOVM - ibrance prescription should be filled by [facility] and they do need to go ahead and set up delivery.  [facility] to call if they have any questions.

## 2014-10-20 ENCOUNTER — Other Ambulatory Visit: Payer: Self-pay | Admitting: *Deleted

## 2014-10-20 ENCOUNTER — Encounter (HOSPITAL_COMMUNITY)
Admission: RE | Admit: 2014-10-20 | Discharge: 2014-10-20 | Disposition: A | Discharge status: Home / Self Care | Payer: BC Managed Care – PPO | Source: Ambulatory Visit | Attending: Oncology | Admitting: Oncology

## 2014-10-20 DIAGNOSIS — C7951 Secondary malignant neoplasm of bone: Secondary | ICD-10-CM | POA: Diagnosis not present

## 2014-10-20 DIAGNOSIS — C50411 Malignant neoplasm of upper-outer quadrant of right female breast: Secondary | ICD-10-CM

## 2014-10-20 MED ORDER — TECHNETIUM TC 99M MEDRONATE IV KIT
27.2000 | PACK | Freq: Once | INTRAVENOUS | Status: AC | PRN
  Administered 2014-10-20: 27.2 via INTRAVENOUS

## 2014-10-20 MED ORDER — ANASTROZOLE 1 MG PO TABS: 1.0000 mg | ORAL_TABLET | Freq: Every day | ORAL | Status: DC

## 2014-10-24 NOTE — Telephone Encounter (Signed)
This RN spoke with [facility] per [name] prescription.  Patient's copay is $ 10 and medication is ready to be delivered.  " we have attempted to call pt and spoke with her one time- she requested [place] to call the following day "  " now we are unable to get a hold of her "  This RN called pt's cell number and obtained VM- message left per above as well as contact number for [facility].

## 2014-10-30 ENCOUNTER — Other Ambulatory Visit: Payer: Self-pay | Admitting: Oncology

## 2014-10-31 ENCOUNTER — Telehealth: Payer: Self-pay | Admitting: *Deleted

## 2014-10-31 NOTE — Telephone Encounter (Addendum)
PT. DOES NOT KNOW IF SHE WILL BE TAKING ANY OTHER MEDICATIONS WITH THE IBRANCE. NOTIFIED TOM WHEN THESE ISSUES ARE RESOLVE.

## 2014-11-08 ENCOUNTER — Other Ambulatory Visit (HOSPITAL_BASED_OUTPATIENT_CLINIC_OR_DEPARTMENT_OTHER): Payer: BC Managed Care – PPO

## 2014-11-08 DIAGNOSIS — C50411 Malignant neoplasm of upper-outer quadrant of right female breast: Secondary | ICD-10-CM | POA: Diagnosis not present

## 2014-11-08 LAB — COMPREHENSIVE METABOLIC PANEL (CC13)
ALT: 16 U/L (ref 0–55)
AST: 16 U/L (ref 5–34)
Albumin: 3.4 g/dL — ABNORMAL LOW (ref 3.5–5.0)
Alkaline Phosphatase: 130 U/L (ref 40–150)
Anion Gap: 8 mEq/L (ref 3–11)
BILIRUBIN TOTAL: 0.63 mg/dL (ref 0.20–1.20)
BUN: 12.2 mg/dL (ref 7.0–26.0)
CO2: 27 meq/L (ref 22–29)
Calcium: 9.6 mg/dL (ref 8.4–10.4)
Chloride: 108 mEq/L (ref 98–109)
Creatinine: 1 mg/dL (ref 0.6–1.1)
EGFR: 79 mL/min/{1.73_m2} — ABNORMAL LOW (ref >90)
GLUCOSE: 97 mg/dL (ref 70–140)
Potassium: 4 mEq/L (ref 3.5–5.1)
SODIUM: 143 meq/L (ref 136–145)
Total Protein: 7 g/dL (ref 6.4–8.3)

## 2014-11-08 LAB — CBC WITH DIFFERENTIAL/PLATELET
BASO%: 0.9 % (ref 0.0–2.0)
Basophils Absolute: 0.1 10*3/uL (ref 0.0–0.1)
EOS ABS: 0.3 10*3/uL (ref 0.0–0.5)
EOS%: 3.5 % (ref 0.0–7.0)
HCT: 39.1 % (ref 34.8–46.6)
HGB: 12.7 g/dL (ref 11.6–15.9)
LYMPH%: 22.2 % (ref 14.0–49.7)
MCH: 27.3 pg (ref 25.1–34.0)
MCHC: 32.3 g/dL (ref 31.5–36.0)
MCV: 84.6 fL (ref 79.5–101.0)
MONO#: 0.6 10*3/uL (ref 0.1–0.9)
MONO%: 7.5 % (ref 0.0–14.0)
NEUT%: 65.9 % (ref 38.4–76.8)
NEUTROS ABS: 5.6 10*3/uL (ref 1.5–6.5)
Platelets: 339 10*3/uL (ref 145–400)
RBC: 4.63 10*6/uL (ref 3.70–5.45)
RDW: 15.1 % — ABNORMAL HIGH (ref 11.2–14.5)
WBC: 8.5 10*3/uL (ref 3.9–10.3)
lymph#: 1.9 10*3/uL (ref 0.9–3.3)

## 2014-11-08 NOTE — Telephone Encounter (Signed)
Call from [facility] with [facility].  Ibrance delivery on hold per patient request."  [facility] reports patient says she has not seen a doctor or talked about this medicine, doesn't know when she is supposed to start Ibrance, doesn't want it sent.  Lab only today, next F/U 11-17-2014.

## 2014-11-09 LAB — VITAMIN D 25 HYDROXY (VIT D DEFICIENCY, FRACTURES): Vit D, 25-Hydroxy: 13 ng/mL — ABNORMAL LOW (ref 30–100)

## 2014-11-09 LAB — CANCER ANTIGEN 27.29: CA 27.29: 132 U/mL — ABNORMAL HIGH (ref 0–39)

## 2014-11-14 ENCOUNTER — Ambulatory Visit (HOSPITAL_BASED_OUTPATIENT_CLINIC_OR_DEPARTMENT_OTHER): Payer: BC Managed Care – PPO | Admitting: Oncology

## 2014-11-14 ENCOUNTER — Telehealth: Payer: Self-pay | Admitting: Oncology

## 2014-11-14 VITALS — BP 155/84 | HR 94 | Temp 98.6°F | Resp 19 | Ht 67.0 in | Wt 352.7 lb

## 2014-11-14 DIAGNOSIS — E559 Vitamin D deficiency, unspecified: Secondary | ICD-10-CM | POA: Diagnosis not present

## 2014-11-14 DIAGNOSIS — C50411 Malignant neoplasm of upper-outer quadrant of right female breast: Secondary | ICD-10-CM

## 2014-11-14 DIAGNOSIS — C7951 Secondary malignant neoplasm of bone: Secondary | ICD-10-CM

## 2014-11-14 DIAGNOSIS — Z170 Estrogen receptor positive status [ER+]: Secondary | ICD-10-CM

## 2014-11-14 DIAGNOSIS — C773 Secondary and unspecified malignant neoplasm of axilla and upper limb lymph nodes: Secondary | ICD-10-CM

## 2014-11-14 DIAGNOSIS — C50919 Malignant neoplasm of unspecified site of unspecified female breast: Secondary | ICD-10-CM | POA: Insufficient documentation

## 2014-11-14 MED ORDER — GABAPENTIN 300 MG PO CAPS: 300.0000 mg | ORAL_CAPSULE | Freq: Every day | ORAL | Status: DC

## 2014-11-14 NOTE — Progress Notes (Signed)
Visit  Danielle Oliver  Telephone:([phone]) [phone] Fax:(336) [phone]     ID: Danielle Oliver DOB: 02/09/1961  MR#: [id number]  EYC#:[id number]  Patient Care Team: Helane Rima, MD as PCP - General (Family Medicine) Erroll Luna, MD as Consulting Physician (General Surgery) Thea Silversmith, MD as Consulting Physician (Radiation Oncology) Holley Bouche, NP as Nurse Practitioner (Nurse Practitioner) Chauncey Cruel, MD as Consulting Physician (Oncology) PCP: Helane Rima, MD GYN: OTHER MD: [name] MD  CHIEF COMPLAINT: Stage IV breast cancer  CURRENT TREATMENT: Anastrozole, palbociclib   BREAST CANCER HISTORY: From Dr Earlie Counts 03/11/2014 intake note:  "She was diagnosed with stage III left breast cancer in 1991 when she was 54 years old. Unfortunately her breast cancer diagnosis and treatment history was not available for review today. Apparently she had left mastectomy, and 4 cycles of adjuvant chemotherapy under Dr. Mariana Kaufman care at our cancer center. Patient does not recall the name of the chemotherapy medication. She was also offered a clinical trial of bone marrow transplant for her breast cancer and she declined. She was followed by January mammograms and had no evidence of recurrence. Her last screening mammogram was in 2009 which was benign.  She noticed a lump in her right breast about 20 weeks ago. It was hard and the tender on palpitation. She denies any skin change or nipple discharge. She has no appetite change, weight loss, or other new symptoms. She had underwent a diagnostic mammogram on 01/12/2014, which showed a 3.6 cm lobulated solid mass in right upper quadrant of right breast, and an oval axillary lymph nodes was cortical thickening. 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 Her right axillary lymph node biopsy also showed invasive  adenocarcinoma."  Her subsequent history is as detailed below  INTERVAL HISTORY: Danielle Oliver returns today to the breast clinic for follow-up of her stage IV estrogen receptor positive breast cancer. She continues on anastrozole, which she is tolerating well. She has had a little bit of hair thinning. She has quite a few hot flashes especially at night. She usually wakes up around 2 or 3 in the morning because of that. She obtains that drug had a very good cost. She has also obtained the Palbociclib at $10 per 21 pills, but has not started it yet.  REVIEW OF SYSTEMS: Aside from the side effects just noted, she is generally doing well. She is planning a trip to [place] for a conference.  PAST MEDICAL HISTORY: Past Medical History  Diagnosis Date  . Breast cancer   . Hypertension   . Gout   . Arthritis   . Morbid obesity with body mass index of 50.0-59.9 in adult     PAST SURGICAL HISTORY: Past Surgical History  Procedure Laterality Date  . Mastectomy Left   . Tonsillectomy      FAMILY HISTORY Family History  Problem Relation Age of Onset  . Colon cancer Father    the patient's father died at 43 from colon cancer which was diagnosed shortly before his death. The patient's mother is living, age 87 as of July 2016. The patient is an only child. The only other cancer in the family to her knowledge is a paternal great aunt who developed breast cancer in her late 35s. There is no history of ovarian cancer and no other history of colon cancer in the family to her knowledge  GYNECOLOGIC HISTORY:  No LMP recorded. Patient is postmenopausal. menarche age 21 first live birth age 25 the patient is Hooker P3. She stopped having periods in 2013. She did not take hormone replacement.   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 She is divorced. At home is just she and her son [name], 67 years old. Son [name], 27, lives in [name] where he works as a Fish farm manager. Son Leonides Sake 10, 25,  lives in [place] and is working on a Oceanographer in counseling at [employer] and [employer]. The patient has one granddaughter, [name], in [name]. She is not a church attender    ADVANCED DIRECTIVES: to be discussed   HEALTH MAINTENANCE: Social History  Substance Use Topics  . Smoking status: Never Smoker   . Smokeless tobacco: Not on file  . Alcohol Use: 1.2 oz/week    2 Glasses of wine per week     Colonoscopy: remote/ [name]  PAP: 2014  Bone density:  Lipid panel:  Allergies  Allergen Reactions  . Iodinated Diagnostic Agents Anaphylaxis  . Penicillins Rash    Current Outpatient Prescriptions  Medication Sig Dispense Refill  . amLODipine-olmesartan (AZOR) 5-40 MG per tablet Take 1 tablet by mouth daily.    Marland Kitchen anastrozole (ARIMIDEX) 1 MG tablet Take 1 tablet (1 mg total) by mouth daily. 30 tablet 6  . cholecalciferol 5000 UNITS TABS Take 1 tablet (5,000 Units total) by mouth daily.    . fexofenadine (ALLEGRA) 180 MG tablet Take 180 mg by mouth daily as needed for allergies.     Marland Kitchen gabapentin (NEURONTIN) 300 MG capsule Take 1 capsule (300 mg total) by mouth at bedtime. 90 capsule 4  . lovastatin (MEVACOR) 40 MG tablet Take 40 mg by mouth.    . palbociclib (IBRANCE) 125 MG capsule Take 1 capsule (125 mg total) by mouth daily with breakfast. Take whole with food. 21 capsule 6   No current facility-administered medications for this visit.    OBJECTIVE: Middle-aged African-American woman  Who appears well  Filed Vitals:   11/14/14 1527  BP: 155/84  Pulse: 94  Temp: 98.6 F (37 C)  Resp: 19     Body mass index is 55.23 kg/(m^2).    ECOG FS:0 - Asymptomatic  Sclerae unicteric, pupils round and equal Oropharynx clear and moist-- no thrush or other lesions No cervical or supraclavicular adenopathy Lungs no rales or rhonchi Heart regular rate and rhythm Abd soft, obese, nontender, positive bowel sounds MSK no focal spinal tenderness, no upper extremity  lymphedema Neuro: nonfocal, well oriented, positive affect Breasts: The mass in the lower outer quadrant of the right breast is much harder to palpate at this point. It feels more like an area of thickening and a well-defined mass. There is no erythema. The right axilla is benign. The left breast is unremarkable    LAB RESULTS:  CMP     Component Value Date/Time   NA 143 11/08/2014 1558   NA 140 11/09/2010 0921   K 4.0 11/08/2014 1558   K 4.6 11/09/2010 0921   CL 104 11/09/2010 0921   CO2 27 11/08/2014 1558   GLUCOSE 97 11/08/2014 1558   GLUCOSE 111* 11/09/2010 0921   BUN 12.2 11/08/2014 1558   BUN 22 11/09/2010 0921   CREATININE 1.0 11/08/2014 1558   CREATININE 1.10 11/09/2010 0921   CALCIUM 9.6 11/08/2014 1558   PROT 7.0 11/08/2014 1558   ALBUMIN 3.4* 11/08/2014 1558   AST 16 11/08/2014 1558   ALT 16  11/08/2014 1558   ALKPHOS 130 11/08/2014 1558   BILITOT 0.63 11/08/2014 1558    INo results found for: SPEP, UPEP  Lab Results  Component Value Date   WBC 8.5 11/08/2014   NEUTROABS 5.6 11/08/2014   HGB 12.7 11/08/2014   HCT 39.1 11/08/2014   MCV 84.6 11/08/2014   PLT 339 11/08/2014      Chemistry      Component Value Date/Time   NA 143 11/08/2014 1558   NA 140 11/09/2010 0921   K 4.0 11/08/2014 1558   K 4.6 11/09/2010 0921   CL 104 11/09/2010 0921   CO2 27 11/08/2014 1558   BUN 12.2 11/08/2014 1558   BUN 22 11/09/2010 0921   CREATININE 1.0 11/08/2014 1558   CREATININE 1.10 11/09/2010 0921      Component Value Date/Time   CALCIUM 9.6 11/08/2014 1558   ALKPHOS 130 11/08/2014 1558   AST 16 11/08/2014 1558   ALT 16 11/08/2014 1558   BILITOT 0.63 11/08/2014 1558       Lab Results  Component Value Date   LABCA2 132* 11/08/2014    No components found for: DZHGD924  No results for input(s): INR in the last 168 hours.  Urinalysis    Component Value Date/Time   LABSPEC 1.015 04/27/2007 1654   PHURINE 6.0 04/27/2007 1654   GLUCOSEU NEGATIVE  04/27/2007 1654   HGBUR NEGATIVE 04/27/2007 1654   BILIRUBINUR NEGATIVE 04/27/2007 1654   KETONESUR NEGATIVE 04/27/2007 1654   PROTEINUR NEGATIVE 04/27/2007 1654   UROBILINOGEN 0.2 04/27/2007 1654   NITRITE NEGATIVE 04/27/2007 1654   LEUKOCYTESUR  04/27/2007 1654    NEGATIVE Biochemical Testing Only. Please order routine urinalysis from main lab if confirmatory testing is needed.  Results for AYNSLEE, MULHALL (MRN [id number]) as of 11/14/2014 15:44  Ref. Range 09/21/2014 16:15 11/08/2014 15:58  Vit D, 25-Hydroxy Latest Ref Range: 30-100 ng/mL 8 (L) 13 (L)   STUDIES: Nm Bone Scan Whole Body  10/20/2014   CLINICAL DATA:  Breast malignancy, follow-up skeletal metastases  EXAM: NUCLEAR MEDICINE WHOLE BODY BONE SCAN  TECHNIQUE: Whole body anterior and posterior images were obtained approximately 3 hours after intravenous injection of radiopharmaceutical.  RADIOPHARMACEUTICALS:  27.2 mCi Technetium-81mMDP IV  COMPARISON:  CT scan of the chest abdomen pelvis of March 18, 2014  FINDINGS: There is adequate uptake of the radiopharmaceutical by the skeleton. Adequate soft tissue clearance and renal activity is observed.  There is moderately increased uptake diffusely within the calvarium. Activity within the spine and ribs is within the limits of normal. There is mildly increased uptake in the left iliac bone medially activity within the lower extremities does not suggest metastatic disease. Limited visualization of the upper extremities reveals a subtle area of increased uptake in the mid right humeral shaft.  IMPRESSION: 1. Abnormal uptake within the calvarium, midshaft of the right humerus, and medial aspect of the left iliac bone which may reflect known metastatic disease. The widespread sclerotic lesions demonstrated on the previous CT scan are not abnormal on today's scan. Plain films of the calvarium, right humerus, and pelvis would be useful. 2. Increased uptake in the knees and ankles most compatible with  degenerative change.   Electronically Signed   By: David  JMartiniqueM.D.   On: 10/20/2014 15:19    ASSESSMENT: 54y.o. [place] woman  (1) status post left mastectomy in 1991 followed by adjuvant chemotherapy 4, no further details available  (2) status post right breast biopsy 01/13/2014 for a  cT2 p N1, stage IIB invasive ductal carcinoma, estrogen and progesterone receptor positive, HER-2 negative, with an MIB-1 of 10%.  METASTATIC DISEASE: CT scan 03/18/2014 shows multiple sclerotic bone mets but no visceral disease  (1) no bone biopsy obtained to this point  (b) CA 27-29 is informative  (3) anastrozole started January 2016  (a)  palbociclib added 11/21/2014  (4) vitamin D deficiency  PLAN: I spent approximately 45 minutes with Martita today going over the overall plan and her situation. We reviewed the bone scan, which shows fewer active spots than I expected. It could be that we are seeing mostly lytic lesions, which don't show well on the bone scan. It could also be that the anastrozole is resulting in decreased activity in the bone cancer lesions.  As far certain treatment goals she is tolerating the anastrozole well. She has no hot flashes during the day, but she certainly does at night. We discussed Neurontin and she has a good understanding of the possible toxicities, side effects and complications of that agent. I went ahead and wrote her a prescription for 300 mg to take at bedtime and she will let me know if that works for her.  We then discussed Palbociclib. She has a good understanding of the possible side effects, toxicities and complete locations of that agent as well and she will let me know if it causes unusual fatigue or if she has a fever or bleeding problems. She is concerned about clotting issues which are unusual but reported with this drug. She is thinking of starting baby aspirin daily which I think is perfectly reasonable. Arnell Sieving she does not want to start the  Palbociclib until she returns from [name] so her first day will be 11/21/2014  We then discussed the bones. She understands that the bones need help if they're going to be able to stand up to the cancer and not fracture. She also understands that patients receive zolendronate or denosumab who have bone lesions live longer than those who don't. We went over the various choices between Fosamax or Boniva or zolendronate or denosumab and she would like to start the denosumab Delton See monthly) but not until she has gotten used to the Palbociclib.  Accordingly she will continue the anastrozole, start Palbociclib 11/21/2014, and start the Delton See when she returns to see me October 11. She will see her dentist before that to have a feeling replaced and she will discuss that further with him. She understands that if there are extractions or implants the risk of osteonecrosis of the jaw does increase.  After the October visit I will plan for restaging CT scans before the end of the year. They will serve as our new baseline.  She knows to call for any problems that may develop before her next visit here.  Chauncey Cruel, MD   11/14/2014 4:18 PM Medical Oncology and Hematology [facility] [address] [place], [place] [address] Tel. [phone]    Fax. [phone]

## 2014-11-14 NOTE — Telephone Encounter (Signed)
Gave avs & calendar for September/October/November. °

## 2014-11-22 ENCOUNTER — Encounter: Payer: Self-pay | Admitting: Oncology

## 2014-11-22 NOTE — Progress Notes (Signed)
Per biologics ibrance has been shipped to patient via [employer]ex.

## 2014-11-29 ENCOUNTER — Other Ambulatory Visit (HOSPITAL_BASED_OUTPATIENT_CLINIC_OR_DEPARTMENT_OTHER): Payer: BC Managed Care – PPO

## 2014-11-29 DIAGNOSIS — C50411 Malignant neoplasm of upper-outer quadrant of right female breast: Secondary | ICD-10-CM | POA: Diagnosis not present

## 2014-11-29 LAB — CBC WITH DIFFERENTIAL/PLATELET
BASO%: 1.1 % (ref 0.0–2.0)
Basophils Absolute: 0.1 10*3/uL (ref 0.0–0.1)
EOS ABS: 0.3 10*3/uL (ref 0.0–0.5)
EOS%: 5.1 % (ref 0.0–7.0)
HCT: 38 % (ref 34.8–46.6)
HGB: 12.3 g/dL (ref 11.6–15.9)
LYMPH%: 28.3 % (ref 14.0–49.7)
MCH: 27.7 pg (ref 25.1–34.0)
MCHC: 32.3 g/dL (ref 31.5–36.0)
MCV: 85.5 fL (ref 79.5–101.0)
MONO#: 0.2 10*3/uL (ref 0.1–0.9)
MONO%: 3.9 % (ref 0.0–14.0)
NEUT#: 4 10*3/uL (ref 1.5–6.5)
NEUT%: 61.6 % (ref 38.4–76.8)
Platelets: 322 10*3/uL (ref 145–400)
RBC: 4.44 10*6/uL (ref 3.70–5.45)
RDW: 14.8 % — ABNORMAL HIGH (ref 11.2–14.5)
WBC: 6.4 10*3/uL (ref 3.9–10.3)
lymph#: 1.8 10*3/uL (ref 0.9–3.3)

## 2014-12-06 ENCOUNTER — Other Ambulatory Visit (HOSPITAL_BASED_OUTPATIENT_CLINIC_OR_DEPARTMENT_OTHER): Payer: BC Managed Care – PPO

## 2014-12-06 DIAGNOSIS — C50411 Malignant neoplasm of upper-outer quadrant of right female breast: Secondary | ICD-10-CM

## 2014-12-06 LAB — CBC WITH DIFFERENTIAL/PLATELET
BASO%: 1 % (ref 0.0–2.0)
Basophils Absolute: 0 10*3/uL (ref 0.0–0.1)
EOS ABS: 0.2 10*3/uL (ref 0.0–0.5)
EOS%: 3.8 % (ref 0.0–7.0)
HCT: 37.8 % (ref 34.8–46.6)
HGB: 12.1 g/dL (ref 11.6–15.9)
LYMPH%: 40.6 % (ref 14.0–49.7)
MCH: 28.3 pg (ref 25.1–34.0)
MCHC: 32 g/dL (ref 31.5–36.0)
MCV: 88.3 fL (ref 79.5–101.0)
MONO#: 0.2 10*3/uL (ref 0.1–0.9)
MONO%: 3.8 % (ref 0.0–14.0)
NEUT%: 50.8 % (ref 38.4–76.8)
NEUTROS ABS: 2 10*3/uL (ref 1.5–6.5)
Platelets: 284 10*3/uL (ref 145–400)
RBC: 4.28 10*6/uL (ref 3.70–5.45)
RDW: 14.7 % — ABNORMAL HIGH (ref 11.2–14.5)
WBC: 3.9 10*3/uL (ref 3.9–10.3)
lymph#: 1.6 10*3/uL (ref 0.9–3.3)

## 2014-12-06 LAB — COMPREHENSIVE METABOLIC PANEL (CC13)
ALT: 14 U/L (ref 0–55)
ANION GAP: 8 meq/L (ref 3–11)
AST: 11 U/L (ref 5–34)
Albumin: 3.4 g/dL — ABNORMAL LOW (ref 3.5–5.0)
Alkaline Phosphatase: 109 U/L (ref 40–150)
BILIRUBIN TOTAL: 0.68 mg/dL (ref 0.20–1.20)
BUN: 14.8 mg/dL (ref 7.0–26.0)
CO2: 28 meq/L (ref 22–29)
Calcium: 9.4 mg/dL (ref 8.4–10.4)
Chloride: 108 mEq/L (ref 98–109)
Creatinine: 1.1 mg/dL (ref 0.6–1.1)
EGFR: 66 mL/min/{1.73_m2} — AB (ref >90)
Glucose: 118 mg/dl (ref 70–140)
POTASSIUM: 4.1 meq/L (ref 3.5–5.1)
Sodium: 144 mEq/L (ref 136–145)
TOTAL PROTEIN: 7.1 g/dL (ref 6.4–8.3)

## 2014-12-07 LAB — CANCER ANTIGEN 27.29: CA 27.29: 101 U/mL — AB (ref 0–39)

## 2014-12-09 ENCOUNTER — Telehealth: Payer: Self-pay | Admitting: Oncology

## 2014-12-09 NOTE — Telephone Encounter (Signed)
returned call and s.w pt and confirmed appts....pt ok and aware °

## 2014-12-12 ENCOUNTER — Other Ambulatory Visit (HOSPITAL_BASED_OUTPATIENT_CLINIC_OR_DEPARTMENT_OTHER): Payer: BC Managed Care – PPO

## 2014-12-12 DIAGNOSIS — C50411 Malignant neoplasm of upper-outer quadrant of right female breast: Secondary | ICD-10-CM

## 2014-12-12 LAB — COMPREHENSIVE METABOLIC PANEL (CC13)
ALBUMIN: 3.5 g/dL (ref 3.5–5.0)
ALK PHOS: 107 U/L (ref 40–150)
ALT: 13 U/L (ref 0–55)
AST: 12 U/L (ref 5–34)
Anion Gap: 7 mEq/L (ref 3–11)
BUN: 15.9 mg/dL (ref 7.0–26.0)
CALCIUM: 9.5 mg/dL (ref 8.4–10.4)
CO2: 28 mEq/L (ref 22–29)
CREATININE: 1.2 mg/dL — AB (ref 0.6–1.1)
Chloride: 107 mEq/L (ref 98–109)
EGFR: 60 mL/min/{1.73_m2} — ABNORMAL LOW (ref >90)
Glucose: 109 mg/dl (ref 70–140)
POTASSIUM: 3.9 meq/L (ref 3.5–5.1)
SODIUM: 142 meq/L (ref 136–145)
Total Bilirubin: 0.67 mg/dL (ref 0.20–1.20)
Total Protein: 7.1 g/dL (ref 6.4–8.3)

## 2014-12-12 LAB — CBC WITH DIFFERENTIAL/PLATELET
BASO%: 1.4 % (ref 0.0–2.0)
Basophils Absolute: 0 10*3/uL (ref 0.0–0.1)
EOS%: 3.1 % (ref 0.0–7.0)
Eosinophils Absolute: 0.1 10*3/uL (ref 0.0–0.5)
HEMATOCRIT: 36.8 % (ref 34.8–46.6)
HEMOGLOBIN: 12.1 g/dL (ref 11.6–15.9)
LYMPH#: 1.7 10*3/uL (ref 0.9–3.3)
LYMPH%: 49.7 % (ref 14.0–49.7)
MCH: 28.2 pg (ref 25.1–34.0)
MCHC: 32.8 g/dL (ref 31.5–36.0)
MCV: 86 fL (ref 79.5–101.0)
MONO#: 0.2 10*3/uL (ref 0.1–0.9)
MONO%: 6.4 % (ref 0.0–14.0)
NEUT%: 39.4 % (ref 38.4–76.8)
NEUTROS ABS: 1.3 10*3/uL — AB (ref 1.5–6.5)
Platelets: 230 10*3/uL (ref 145–400)
RBC: 4.28 10*6/uL (ref 3.70–5.45)
RDW: 14.9 % — AB (ref 11.2–14.5)
WBC: 3.4 10*3/uL — ABNORMAL LOW (ref 3.9–10.3)

## 2014-12-13 ENCOUNTER — Ambulatory Visit: Payer: BC Managed Care – PPO

## 2014-12-13 ENCOUNTER — Telehealth: Payer: Self-pay | Admitting: Oncology

## 2014-12-13 ENCOUNTER — Ambulatory Visit (HOSPITAL_BASED_OUTPATIENT_CLINIC_OR_DEPARTMENT_OTHER): Payer: BC Managed Care – PPO | Admitting: Oncology

## 2014-12-13 ENCOUNTER — Other Ambulatory Visit: Payer: Self-pay | Admitting: Oncology

## 2014-12-13 VITALS — BP 151/76 | HR 92 | Temp 98.1°F | Resp 18 | Ht 67.0 in | Wt 357.1 lb

## 2014-12-13 DIAGNOSIS — C50411 Malignant neoplasm of upper-outer quadrant of right female breast: Secondary | ICD-10-CM | POA: Diagnosis not present

## 2014-12-13 DIAGNOSIS — C773 Secondary and unspecified malignant neoplasm of axilla and upper limb lymph nodes: Secondary | ICD-10-CM | POA: Diagnosis not present

## 2014-12-13 DIAGNOSIS — Z170 Estrogen receptor positive status [ER+]: Secondary | ICD-10-CM | POA: Diagnosis not present

## 2014-12-13 DIAGNOSIS — C7951 Secondary malignant neoplasm of bone: Secondary | ICD-10-CM

## 2014-12-13 MED ORDER — PALBOCICLIB 100 MG PO CAPS
100.0000 mg | ORAL_CAPSULE | Freq: Every day | ORAL | Status: DC
Start: 2014-12-13 — End: 2014-12-16

## 2014-12-13 NOTE — Telephone Encounter (Signed)
I sent the surgeons (Cornett and Lucia Gaskins) a note--they should sort it out   thanks!

## 2014-12-13 NOTE — Telephone Encounter (Signed)
Patient called this afternoon because she called to make an appt with Dr. Lucia Gaskins and his office wouldn't make the appt until they received permission from Dr. Brantley Stage.  Patient is asking Dr. Jana Hakim if he has any suggestions regarding this situation.

## 2014-12-13 NOTE — Telephone Encounter (Signed)
Spoke with pateint and now her mammo is at [facility] 12/19/14 3:00,order faxed,per dr Jana Hakim he has sent [facility]/dr newman a message and patient will call them

## 2014-12-13 NOTE — Progress Notes (Signed)
Visit  Danielle Oliver  [phone] [phone]     ID: Danielle Oliver DOB: 03/28/1960  MR#: [id number]  MHD#:[id number]  Patient Care Team: Helane Rima, MD as PCP - General (Family Medicine) Erroll Luna, MD as Consulting Physician (General Surgery) Thea Silversmith, MD as Consulting Physician (Radiation Oncology) Holley Bouche, NP as Nurse Practitioner (Nurse Practitioner) Chauncey Cruel, MD as Consulting Physician (Oncology) PCP: Helane Rima, MD GYN: OTHER MD: [name] MD  CHIEF COMPLAINT: Stage IV breast cancer  CURRENT TREATMENT: Anastrozole, palbociclib   BREAST CANCER HISTORY: From Dr Earlie Counts 03/11/2014 intake note:  "She was diagnosed with stage III left breast cancer in 1991 when she was 54 years old. Unfortunately her breast cancer diagnosis and treatment history was not available for review today. Apparently she had left mastectomy, and 4 cycles of adjuvant chemotherapy under Dr. Mariana Kaufman care at our cancer center. Patient does not recall the name of the chemotherapy medication. She was also offered a clinical trial of bone marrow transplant for her breast cancer and she declined. She was followed by January mammograms and had no evidence of recurrence. Her last screening mammogram was in 2009 which was benign.  She noticed a lump in her right breast about 20 weeks ago. It was hard and the tender on palpitation. She denies any skin change or nipple discharge. She has no appetite change, weight loss, or other new symptoms. She had underwent a diagnostic mammogram on 01/12/2014, which showed a 3.6 cm lobulated solid mass in right upper quadrant of right breast, and an oval axillary lymph nodes was cortical thickening. 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 Her right axillary lymph node biopsy also showed invasive  adenocarcinoma."  Her subsequent history is as detailed below  INTERVAL HISTORY: Danielle Oliver returns today for follow-up of her stage IV estrogen receptor positive breast cancer. She continues on anastrozole, which she tolerates well. She does have nighttime hot flashes, which wake her up about 3 or 4 in the morning. We had prescribed gabapentin but she never started it. She read all the possible side effects and up possibility of having to "taper off". I reassured her that she is taking such a low dose of that would not be necessary. Also she has not developed arthralgias or myalgias. She does have knee pain, which she attributes to the anastrozole, but I explained that this is going to be due to osteoarthritis and not to that pill. Also today is day 21 cycle 1 of Palbociclib. She has had no side effects from that medication that she is aware of. She will now be off for 7 days before starting her second cycle  REVIEW OF SYSTEMS: She has a mid frontal headache which comes and goes. She is not taking any medication for that. She tells me her primary care physician wants her to have a colonoscopy. This is being set up. She was supposed to have seen her dentist already but has not set that up yet. She tells me a filling needs to be replaced. There are no plans for extractions. She also tells me her daughter is a little worried about her. She is 54 years old. She thought her mother would need chemotherapy and was prepared for that and then the plans changed. Aside from these issues a detailed review of systems today was noncontributory  PAST MEDICAL HISTORY: Past Medical History  Diagnosis Date  . Breast cancer   . Hypertension   . Gout   . Arthritis   . Morbid obesity with body mass index of 50.0-59.9 in adult     PAST SURGICAL HISTORY: Past Surgical History  Procedure Laterality Date  . Mastectomy Left   . Tonsillectomy      FAMILY HISTORY Family History  Problem Relation Age of Onset  . Colon  cancer Father    the patient's father died at 7 from colon cancer which was diagnosed shortly before his death. The patient's mother is living, age 72 as of July 2016. The patient is an only child. The only other cancer in the family to her knowledge is a paternal great aunt who developed breast cancer in her late 61s. There is no history of ovarian cancer and no other history of colon cancer in the family to her knowledge  GYNECOLOGIC HISTORY:  No LMP recorded. Patient is postmenopausal. menarche age 68 first live birth age 43 the patient is [place] P3. She stopped having periods in 2013. She did not take hormone replacement.   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 She is divorced. At home is just she and her son Danielle Oliver, 53 years old. Son Danielle Oliver, 27, lives in [name] where he works as a Fish farm manager. Son Danielle Oliver, 25, lives in [place] and is working on a Oceanographer in counseling at [employer]. The patient has one granddaughter, Danielle Oliver, in [name]. She is not a church attender    ADVANCED DIRECTIVES: Not in place   HEALTH MAINTENANCE: Social History  Substance Use Topics  . Smoking status: Never Smoker   . Smokeless tobacco: Not on file  . Alcohol Use: 1.2 oz/week    2 Glasses of wine per week     Colonoscopy: remote/ Danielle Oliver  PAP: 2014  Bone density:  Lipid panel:  Allergies  Allergen Reactions  . Iodinated Diagnostic Agents Anaphylaxis  . Penicillins Rash    Current Outpatient Prescriptions  Medication Sig Dispense Refill  . amLODipine-olmesartan (AZOR) 5-40 MG per tablet Take 1 tablet by mouth daily.    Marland Kitchen anastrozole (ARIMIDEX) 1 MG tablet Take 1 tablet (1 mg total) by mouth daily. 30 tablet 6  . cholecalciferol 5000 UNITS TABS Take 1 tablet (5,000 Units total) by mouth daily.    . fexofenadine (ALLEGRA) 180 MG tablet Take 180 mg by mouth daily as needed for allergies.     Marland Kitchen gabapentin (NEURONTIN) 300 MG capsule Take 1 capsule (300 mg  total) by mouth at bedtime. 90 capsule 4  . lovastatin (MEVACOR) 40 MG tablet Take 40 mg by mouth.    . palbociclib (IBRANCE) 100 MG capsule Take 1 capsule (100 mg total) by mouth daily with breakfast. Take whole with food. 21 capsule 6   No current facility-administered medications for this visit.    OBJECTIVE: Middle-aged African-American woman in no acute distress  Filed Vitals:   12/13/14 0817  BP: 151/76  Pulse: 92  Temp: 98.1 F (36.7 C)  Resp: 18     Body mass index is 55.92 kg/(m^2).    ECOG FS:1 - Symptomatic but completely ambulatory  Sclerae unicteric, EOMs intact Oropharynx clear, dentition in good repair No cervical or supraclavicular adenopathy Lungs no rales or rhonchi Heart regular rate and rhythm Abd soft, nontender, positive bowel sounds MSK no focal spinal tenderness, no upper extremity lymphedema Neuro: nonfocal, well oriented, appropriate affect Breasts: Deferred today; prior exam showed the mass in  the lower outer quadrant of the right breast had become softer. The right axilla was benign. The left breast was unremarkable.   LAB RESULTS: Results for Danielle Oliver, Danielle Oliver (MRN [id number]) as of 12/13/2014 07:44  Ref. Range 09/21/2014 16:15 11/08/2014 15:58 12/06/2014 16:06  CA 27.29 Latest Ref Range: 0-39 U/mL 163 (H) 132 (H) 101 (H)    Results for Danielle Oliver, Danielle Oliver (MRN [id number]) as of 12/13/2014 07:44  Ref. Range 09/21/2014 16:15 11/08/2014 15:58 11/29/2014 16:01 12/06/2014 16:06 12/12/2014 16:12  NEUT# Latest Ref Range: 1.5-6.5 10e3/uL 4.9 5.6 4.0 2.0 1.3 (L)    CMP     Component Value Date/Time   NA 142 12/12/2014 1612   NA 140 11/09/2010 0921   K 3.9 12/12/2014 1612   K 4.6 11/09/2010 0921   CL 104 11/09/2010 0921   CO2 28 12/12/2014 1612   GLUCOSE 109 12/12/2014 1612   GLUCOSE 111* 11/09/2010 0921   BUN 15.9 12/12/2014 1612   BUN 22 11/09/2010 0921   CREATININE 1.2* 12/12/2014 1612   CREATININE 1.10 11/09/2010 0921   CALCIUM 9.5 12/12/2014 1612   PROT  7.1 12/12/2014 1612   ALBUMIN 3.5 12/12/2014 1612   AST 12 12/12/2014 1612   ALT 13 12/12/2014 1612   ALKPHOS 107 12/12/2014 1612   BILITOT 0.67 12/12/2014 1612    INo results found for: SPEP, UPEP  Lab Results  Component Value Date   WBC 3.4* 12/12/2014   NEUTROABS 1.3* 12/12/2014   HGB 12.1 12/12/2014   HCT 36.8 12/12/2014   MCV 86.0 12/12/2014   PLT 230 12/12/2014      Chemistry      Component Value Date/Time   NA 142 12/12/2014 1612   NA 140 11/09/2010 0921   K 3.9 12/12/2014 1612   K 4.6 11/09/2010 0921   CL 104 11/09/2010 0921   CO2 28 12/12/2014 1612   BUN 15.9 12/12/2014 1612   BUN 22 11/09/2010 0921   CREATININE 1.2* 12/12/2014 1612   CREATININE 1.10 11/09/2010 0921      Component Value Date/Time   CALCIUM 9.5 12/12/2014 1612   ALKPHOS 107 12/12/2014 1612   AST 12 12/12/2014 1612   ALT 13 12/12/2014 1612   BILITOT 0.67 12/12/2014 1612       Lab Results  Component Value Date   LABCA2 101* 12/06/2014    No components found for: [id number]  No results for input(s): INR in the last 168 hours.  Urinalysis    Component Value Date/Time   LABSPEC 1.015 04/27/2007 1654   PHURINE 6.0 04/27/2007 1654   GLUCOSEU NEGATIVE 04/27/2007 1654   HGBUR NEGATIVE 04/27/2007 1654   BILIRUBINUR NEGATIVE 04/27/2007 1654   KETONESUR NEGATIVE 04/27/2007 1654   PROTEINUR NEGATIVE 04/27/2007 1654   UROBILINOGEN 0.2 04/27/2007 1654   NITRITE NEGATIVE 04/27/2007 1654   LEUKOCYTESUR  04/27/2007 1654    NEGATIVE Biochemical Testing Only. Please order routine urinalysis from main lab if confirmatory testing is needed.  Results for Danielle Oliver, Danielle Oliver (MRN [id number]) as of 11/14/2014 15:44  Ref. Range 09/21/2014 16:15 11/08/2014 15:58  Vit D, 25-Hydroxy Latest Ref Range: 30-100 ng/mL 8 (L) 13 (L)   STUDIES: No results found.  ASSESSMENT: 54 y.o. [place] woman  (1) status post left mastectomy in 1991 followed by adjuvant chemotherapy 4, no further details available  (2)  status post right breast biopsy 01/13/2014 for a  cT2 p N1, stage IIB invasive ductal carcinoma, estrogen and progesterone receptor positive, HER-2 negative, with an MIB-1 of 10%.  METASTATIC DISEASE: CT scan 03/18/2014 shows multiple sclerotic bone mets but no visceral disease  (1) no bone biopsy obtained to this point  (b) CA 27-29 is informative  (3) anastrozole started January 2016  (a)  palbociclib added 11/21/2014 at 125 mg daily, 21/7  (b) Palbociclib dose decreased to 100 mg daily, 21/ 7, with cycle 2, starting 12/21/2014  (4) vitamin D deficiency: On replacement  (5) local treatment: The patient has been set up for mammography, MRI, and referral to surgery 12/13/2014  (6) to benign denosumab Xgeva 12/15/2014  PLAN: Danielle Oliver continues to tolerate the anastrozole well, and we have evidence of early response in the drop in CA-27-29 numbers. She also tolerated the Palbociclib well. Her counts however have dropped and I think we are going to have to decrease her dose to the next level with cycle 2. That order as being placed.  She is nervous about starting any medications so never started the gabapentin. I think this will help her sleep at night and I reassured her that he she would not have to taper off if she wanted to stop it. As far as her mild headaches is concerned I suggested she start her Zyrtec and take it for a week. That should clear it.  She wanted to move the first Xgeva dose to Thursdays because of concerns regarding possible bone pain. Accordingly her first dose of Xgeva will be 12/15/2014. I have asked her to take Tums 4 times a day that day and the next. She can take any nonsteroidal for pain control that she wishes and if there are any unusual symptoms she will call and let us know.  Dr. Lavone Neri is setting her up for colonoscopy. I see no problem with that. Abraham is going to be seeing her dentist soon but her teeth are in good shape and all she needs is a filling replaced.  There were no not be any extractions.  Her 55-year-old daughter she says is worried about her. I Oliver her information on kids Path and particularly the "field day" they're having 12/21/2014. I encouraged her to participate in that  If Valene is going to have lumpectomy and sentinel lymph node sampling at some point, which I think she will, since she has only own metastases, she would like to do at this year, because she has a ready met her deductible. Accordingly I am placing a referral to [facility] and I am also sending neuropathy for mammography with tomography and a breast MRI preop.  We will obtain a restaging PET scan late December.  She will return to see me November 10. The overall plan systemically is to continue anastrozole/Palbociclib/10 as a map indefinitely so long as we have evidence of continuing response. She has a good understanding of this plan. She agrees with it. She knows a goal of treatment in her case is control. She will call with any problems that may develop before her next visit here.   Chauncey Cruel, MD   12/13/2014 9:00 AM Medical Oncology and Hematology [facility] [address] [place], [place] [address] Tel. [phone]    Fax. [phone]

## 2014-12-14 ENCOUNTER — Telehealth: Payer: Self-pay | Admitting: Oncology

## 2014-12-14 NOTE — Telephone Encounter (Signed)
Dr Jana Hakim appointment added and will have patient get a new schedule at 10/13 inj

## 2014-12-15 ENCOUNTER — Ambulatory Visit (HOSPITAL_BASED_OUTPATIENT_CLINIC_OR_DEPARTMENT_OTHER): Payer: BC Managed Care – PPO

## 2014-12-15 VITALS — BP 158/79 | HR 95 | Temp 97.9°F | Resp 20

## 2014-12-15 DIAGNOSIS — C7951 Secondary malignant neoplasm of bone: Secondary | ICD-10-CM

## 2014-12-15 DIAGNOSIS — C50411 Malignant neoplasm of upper-outer quadrant of right female breast: Secondary | ICD-10-CM

## 2014-12-15 DIAGNOSIS — C50919 Malignant neoplasm of unspecified site of unspecified female breast: Secondary | ICD-10-CM

## 2014-12-15 MED ORDER — DENOSUMAB 120 MG/1.7ML ~~LOC~~ SOLN
120.0000 mg | Freq: Once | SUBCUTANEOUS | Status: AC
  Administered 2014-12-15: 120 mg via SUBCUTANEOUS
  Filled 2014-12-15: qty 1.7

## 2014-12-16 ENCOUNTER — Telehealth: Payer: Self-pay | Admitting: *Deleted

## 2014-12-16 DIAGNOSIS — C50919 Malignant neoplasm of unspecified site of unspecified female breast: Secondary | ICD-10-CM

## 2014-12-16 DIAGNOSIS — C7951 Secondary malignant neoplasm of bone: Principal | ICD-10-CM

## 2014-12-16 MED ORDER — PALBOCICLIB 100 MG PO CAPS: 100.0000 mg | ORAL_CAPSULE | Freq: Every day | ORAL | Status: DC

## 2014-12-16 NOTE — Telephone Encounter (Signed)
Danielle Oliver with [employer] called "in reference to [place] twenty-one day use.  Does she have a seven-day rest?"  Verified plan on office note for Palbociclib 21/7.  Danielle Oliver will correct hard copy.  This nurse will update order information.

## 2014-12-19 ENCOUNTER — Encounter: Payer: Self-pay | Admitting: Oncology

## 2014-12-19 ENCOUNTER — Other Ambulatory Visit: Payer: BC Managed Care – PPO

## 2014-12-19 NOTE — Progress Notes (Signed)
Per [employer] ibrance was shipped via fedex °

## 2014-12-20 ENCOUNTER — Other Ambulatory Visit (HOSPITAL_BASED_OUTPATIENT_CLINIC_OR_DEPARTMENT_OTHER): Payer: BC Managed Care – PPO

## 2014-12-20 DIAGNOSIS — C50411 Malignant neoplasm of upper-outer quadrant of right female breast: Secondary | ICD-10-CM | POA: Diagnosis not present

## 2014-12-20 LAB — CBC WITH DIFFERENTIAL/PLATELET
BASO%: 1.8 % (ref 0.0–2.0)
BASOS ABS: 0.1 10*3/uL (ref 0.0–0.1)
EOS%: 2 % (ref 0.0–7.0)
Eosinophils Absolute: 0.1 10*3/uL (ref 0.0–0.5)
HEMATOCRIT: 35.3 % (ref 34.8–46.6)
HGB: 11.7 g/dL (ref 11.6–15.9)
LYMPH%: 41.4 % (ref 14.0–49.7)
MCH: 29 pg (ref 25.1–34.0)
MCHC: 33.2 g/dL (ref 31.5–36.0)
MCV: 87.4 fL (ref 79.5–101.0)
MONO#: 0.4 10*3/uL (ref 0.1–0.9)
MONO%: 9.6 % (ref 0.0–14.0)
NEUT#: 1.8 10*3/uL (ref 1.5–6.5)
NEUT%: 45.2 % (ref 38.4–76.8)
PLATELETS: 266 10*3/uL (ref 145–400)
RBC: 4.04 10*6/uL (ref 3.70–5.45)
RDW: 17.1 % — AB (ref 11.2–14.5)
WBC: 4 10*3/uL (ref 3.9–10.3)
lymph#: 1.6 10*3/uL (ref 0.9–3.3)

## 2014-12-20 LAB — COMPREHENSIVE METABOLIC PANEL (CC13)
ALT: 12 U/L (ref 0–55)
ANION GAP: 9 meq/L (ref 3–11)
AST: 12 U/L (ref 5–34)
Albumin: 3.4 g/dL — ABNORMAL LOW (ref 3.5–5.0)
Alkaline Phosphatase: 107 U/L (ref 40–150)
BUN: 13.3 mg/dL (ref 7.0–26.0)
CHLORIDE: 111 meq/L — AB (ref 98–109)
CO2: 24 mEq/L (ref 22–29)
Calcium: 8.1 mg/dL — ABNORMAL LOW (ref 8.4–10.4)
Creatinine: 1 mg/dL (ref 0.6–1.1)
EGFR: 77 mL/min/{1.73_m2} — ABNORMAL LOW (ref >90)
Glucose: 109 mg/dl (ref 70–140)
POTASSIUM: 4 meq/L (ref 3.5–5.1)
Sodium: 144 mEq/L (ref 136–145)
Total Bilirubin: 0.53 mg/dL (ref 0.20–1.20)
Total Protein: 6.8 g/dL (ref 6.4–8.3)

## 2014-12-27 ENCOUNTER — Other Ambulatory Visit (HOSPITAL_BASED_OUTPATIENT_CLINIC_OR_DEPARTMENT_OTHER): Payer: BC Managed Care – PPO

## 2014-12-27 DIAGNOSIS — C50411 Malignant neoplasm of upper-outer quadrant of right female breast: Secondary | ICD-10-CM | POA: Diagnosis not present

## 2014-12-27 LAB — COMPREHENSIVE METABOLIC PANEL (CC13)
ALBUMIN: 3.6 g/dL (ref 3.5–5.0)
ALT: 14 U/L (ref 0–55)
AST: 14 U/L (ref 5–34)
Alkaline Phosphatase: 107 U/L (ref 40–150)
Anion Gap: 9 mEq/L (ref 3–11)
BUN: 12 mg/dL (ref 7.0–26.0)
CALCIUM: 7.8 mg/dL — AB (ref 8.4–10.4)
CO2: 22 mEq/L (ref 22–29)
Chloride: 113 mEq/L — ABNORMAL HIGH (ref 98–109)
Creatinine: 0.9 mg/dL (ref 0.6–1.1)
EGFR: 84 mL/min/{1.73_m2} — ABNORMAL LOW (ref >90)
Glucose: 107 mg/dl (ref 70–140)
POTASSIUM: 4 meq/L (ref 3.5–5.1)
Sodium: 144 mEq/L (ref 136–145)
TOTAL PROTEIN: 7.3 g/dL (ref 6.4–8.3)
Total Bilirubin: 0.72 mg/dL (ref 0.20–1.20)

## 2014-12-27 LAB — CBC WITH DIFFERENTIAL/PLATELET
BASO%: 2.2 % — ABNORMAL HIGH (ref 0.0–2.0)
BASOS ABS: 0.1 10*3/uL (ref 0.0–0.1)
EOS ABS: 0.1 10*3/uL (ref 0.0–0.5)
EOS%: 2 % (ref 0.0–7.0)
HEMATOCRIT: 38.4 % (ref 34.8–46.6)
HEMOGLOBIN: 12.6 g/dL (ref 11.6–15.9)
LYMPH#: 1.7 10*3/uL (ref 0.9–3.3)
LYMPH%: 33.7 % (ref 14.0–49.7)
MCH: 29.4 pg (ref 25.1–34.0)
MCHC: 32.8 g/dL (ref 31.5–36.0)
MCV: 89.7 fL (ref 79.5–101.0)
MONO#: 0.4 10*3/uL (ref 0.1–0.9)
MONO%: 7.1 % (ref 0.0–14.0)
NEUT%: 55 % (ref 38.4–76.8)
NEUTROS ABS: 2.7 10*3/uL (ref 1.5–6.5)
Platelets: 350 10*3/uL (ref 145–400)
RBC: 4.28 10*6/uL (ref 3.70–5.45)
RDW: 17.3 % — AB (ref 11.2–14.5)
WBC: 5 10*3/uL (ref 3.9–10.3)

## 2014-12-28 LAB — CANCER ANTIGEN 27.29: CA 27.29: 95 U/mL — ABNORMAL HIGH (ref 0–39)

## 2014-12-30 ENCOUNTER — Other Ambulatory Visit: Payer: Self-pay | Admitting: Surgery

## 2015-01-02 ENCOUNTER — Other Ambulatory Visit: Payer: Self-pay | Admitting: *Deleted

## 2015-01-02 ENCOUNTER — Ambulatory Visit
Admission: RE | Admit: 2015-01-02 | Discharge: 2015-01-02 | Disposition: A | Discharge status: Home / Self Care | Payer: BC Managed Care – PPO | Source: Ambulatory Visit | Attending: Oncology | Admitting: Oncology

## 2015-01-02 DIAGNOSIS — C50411 Malignant neoplasm of upper-outer quadrant of right female breast: Secondary | ICD-10-CM

## 2015-01-03 ENCOUNTER — Other Ambulatory Visit: Payer: Self-pay | Admitting: *Deleted

## 2015-01-03 ENCOUNTER — Telehealth: Payer: Self-pay | Admitting: *Deleted

## 2015-01-03 ENCOUNTER — Other Ambulatory Visit (HOSPITAL_BASED_OUTPATIENT_CLINIC_OR_DEPARTMENT_OTHER): Payer: BC Managed Care – PPO

## 2015-01-03 DIAGNOSIS — C50411 Malignant neoplasm of upper-outer quadrant of right female breast: Secondary | ICD-10-CM

## 2015-01-03 LAB — CBC WITH DIFFERENTIAL/PLATELET
BASO%: 3.3 % — AB (ref 0.0–2.0)
Basophils Absolute: 0.1 10*3/uL (ref 0.0–0.1)
EOS%: 2.8 % (ref 0.0–7.0)
Eosinophils Absolute: 0.1 10*3/uL (ref 0.0–0.5)
HEMATOCRIT: 36 % (ref 34.8–46.6)
HGB: 12 g/dL (ref 11.6–15.9)
LYMPH#: 1.4 10*3/uL (ref 0.9–3.3)
LYMPH%: 37.6 % (ref 14.0–49.7)
MCH: 29.6 pg (ref 25.1–34.0)
MCHC: 33.2 g/dL (ref 31.5–36.0)
MCV: 89.3 fL (ref 79.5–101.0)
MONO#: 0.3 10*3/uL (ref 0.1–0.9)
MONO%: 7.9 % (ref 0.0–14.0)
NEUT%: 48.4 % (ref 38.4–76.8)
NEUTROS ABS: 1.9 10*3/uL (ref 1.5–6.5)
PLATELETS: 409 10*3/uL — AB (ref 145–400)
RBC: 4.04 10*6/uL (ref 3.70–5.45)
RDW: 19.3 % — ABNORMAL HIGH (ref 11.2–14.5)
WBC: 3.8 10*3/uL — ABNORMAL LOW (ref 3.9–10.3)

## 2015-01-03 NOTE — Telephone Encounter (Signed)
Spoke with patient.  [facility] was unable to get IV access yesterday for her breast MRI.  They suggested she come here first and get IV access and then come for her Breast MRI on Tuesday 01/17/15.  She also has labs that day.  So will do POF for labs at 1:15pm and then IV access for Breast MRI@1 :30 (can also hopefully draw labs with IV start).  Breast MRI is scheduled for 2:45, so that should give plenty of time for the IV access and then travel to [place].  Encouraged patient to drink plenty of non-caffeinated fluids that day to help with IV access. Also, the patient saw Dr. Jana Hakim on Oct.11th and he states patient is to return on Nov.10th to see him.  However, she was not scheduled back to see him until December the 8th.  Will ask Dr. Jana Hakim and his nurse to review this and see if she does need to see him closer to Nov. 10th.

## 2015-01-03 NOTE — Telephone Encounter (Signed)
Reviewed with MD - noted POF sent 10/11 requesting appt for 11/10 not processed .  Pt would like to see pt post MRI and POF sent today to schedule pt and lab on 11/16- cancel lab on 11/15.

## 2015-01-04 ENCOUNTER — Telehealth: Payer: Self-pay | Admitting: Oncology

## 2015-01-04 NOTE — Telephone Encounter (Signed)
Appointments made per pof and patient called and left message with al info

## 2015-01-10 ENCOUNTER — Telehealth: Payer: Self-pay | Admitting: Oncology

## 2015-01-10 ENCOUNTER — Ambulatory Visit: Payer: BC Managed Care – PPO

## 2015-01-10 ENCOUNTER — Other Ambulatory Visit: Payer: BC Managed Care – PPO

## 2015-01-10 NOTE — Telephone Encounter (Signed)
Appointments rescheduled for this thurs per patient

## 2015-01-12 ENCOUNTER — Other Ambulatory Visit (HOSPITAL_BASED_OUTPATIENT_CLINIC_OR_DEPARTMENT_OTHER): Payer: BC Managed Care – PPO

## 2015-01-12 ENCOUNTER — Ambulatory Visit (HOSPITAL_BASED_OUTPATIENT_CLINIC_OR_DEPARTMENT_OTHER): Payer: BC Managed Care – PPO

## 2015-01-12 ENCOUNTER — Encounter: Payer: Self-pay | Admitting: Oncology

## 2015-01-12 VITALS — BP 145/87 | HR 89 | Temp 98.6°F

## 2015-01-12 DIAGNOSIS — C7951 Secondary malignant neoplasm of bone: Secondary | ICD-10-CM | POA: Diagnosis not present

## 2015-01-12 DIAGNOSIS — C773 Secondary and unspecified malignant neoplasm of axilla and upper limb lymph nodes: Secondary | ICD-10-CM

## 2015-01-12 DIAGNOSIS — C50411 Malignant neoplasm of upper-outer quadrant of right female breast: Secondary | ICD-10-CM | POA: Diagnosis not present

## 2015-01-12 DIAGNOSIS — C50919 Malignant neoplasm of unspecified site of unspecified female breast: Secondary | ICD-10-CM

## 2015-01-12 LAB — COMPREHENSIVE METABOLIC PANEL (CC13)
ALT: 12 U/L (ref 0–55)
ANION GAP: 7 meq/L (ref 3–11)
AST: 13 U/L (ref 5–34)
Albumin: 3.4 g/dL — ABNORMAL LOW (ref 3.5–5.0)
Alkaline Phosphatase: 93 U/L (ref 40–150)
BILIRUBIN TOTAL: 0.62 mg/dL (ref 0.20–1.20)
BUN: 9.9 mg/dL (ref 7.0–26.0)
CO2: 24 meq/L (ref 22–29)
CREATININE: 0.9 mg/dL (ref 0.6–1.1)
Calcium: 8.7 mg/dL (ref 8.4–10.4)
Chloride: 111 mEq/L — ABNORMAL HIGH (ref 98–109)
EGFR: 88 mL/min/{1.73_m2} — ABNORMAL LOW (ref >90)
Glucose: 92 mg/dl (ref 70–140)
Potassium: 3.9 mEq/L (ref 3.5–5.1)
Sodium: 142 mEq/L (ref 136–145)
TOTAL PROTEIN: 7 g/dL (ref 6.4–8.3)

## 2015-01-12 LAB — CBC WITH DIFFERENTIAL/PLATELET
BASO%: 2.2 % — AB (ref 0.0–2.0)
BASOS ABS: 0.1 10*3/uL (ref 0.0–0.1)
EOS%: 3.9 % (ref 0.0–7.0)
Eosinophils Absolute: 0.2 10*3/uL (ref 0.0–0.5)
HEMATOCRIT: 36.6 % (ref 34.8–46.6)
HGB: 12.2 g/dL (ref 11.6–15.9)
LYMPH#: 1.7 10*3/uL (ref 0.9–3.3)
LYMPH%: 41.3 % (ref 14.0–49.7)
MCH: 30.3 pg (ref 25.1–34.0)
MCHC: 33.3 g/dL (ref 31.5–36.0)
MCV: 91 fL (ref 79.5–101.0)
MONO#: 0.3 10*3/uL (ref 0.1–0.9)
MONO%: 7.1 % (ref 0.0–14.0)
NEUT#: 1.9 10*3/uL (ref 1.5–6.5)
NEUT%: 45.5 % (ref 38.4–76.8)
PLATELETS: 253 10*3/uL (ref 145–400)
RBC: 4.02 10*6/uL (ref 3.70–5.45)
RDW: 17.9 % — AB (ref 11.2–14.5)
WBC: 4.1 10*3/uL (ref 3.9–10.3)

## 2015-01-12 MED ORDER — DENOSUMAB 120 MG/1.7ML ~~LOC~~ SOLN
120.0000 mg | Freq: Once | SUBCUTANEOUS | Status: AC
  Administered 2015-01-12: 120 mg via SUBCUTANEOUS
  Filled 2015-01-12: qty 1.7

## 2015-01-12 NOTE — Progress Notes (Signed)
Encouraged to eat foods higher in calcium-list given.  Also to take a supplement of Calcium with vitamin D.

## 2015-01-12 NOTE — Progress Notes (Signed)
Per [employer] ibrance was shipped via fedex °

## 2015-01-13 LAB — CANCER ANTIGEN 27.29: CA 27.29: 103 U/mL — AB (ref 0–39)

## 2015-01-16 ENCOUNTER — Other Ambulatory Visit: Payer: Self-pay | Admitting: Oncology

## 2015-01-17 ENCOUNTER — Other Ambulatory Visit: Payer: Self-pay | Admitting: Oncology

## 2015-01-17 ENCOUNTER — Other Ambulatory Visit (HOSPITAL_BASED_OUTPATIENT_CLINIC_OR_DEPARTMENT_OTHER): Payer: BC Managed Care – PPO

## 2015-01-17 ENCOUNTER — Ambulatory Visit: Admission: RE | Admit: 2015-01-17 | Payer: BC Managed Care – PPO | Source: Ambulatory Visit

## 2015-01-17 ENCOUNTER — Other Ambulatory Visit: Payer: BC Managed Care – PPO

## 2015-01-17 ENCOUNTER — Ambulatory Visit: Payer: BC Managed Care – PPO

## 2015-01-17 DIAGNOSIS — Z95828 Presence of other vascular implants and grafts: Secondary | ICD-10-CM

## 2015-01-17 DIAGNOSIS — C50411 Malignant neoplasm of upper-outer quadrant of right female breast: Secondary | ICD-10-CM | POA: Diagnosis not present

## 2015-01-17 LAB — CBC WITH DIFFERENTIAL/PLATELET
BASO%: 2.3 % — ABNORMAL HIGH (ref 0.0–2.0)
Basophils Absolute: 0.1 10*3/uL (ref 0.0–0.1)
EOS%: 2.3 % (ref 0.0–7.0)
Eosinophils Absolute: 0.1 10*3/uL (ref 0.0–0.5)
HCT: 38.8 % (ref 34.8–46.6)
HGB: 12.9 g/dL (ref 11.6–15.9)
LYMPH%: 36.7 % (ref 14.0–49.7)
MCH: 30.2 pg (ref 25.1–34.0)
MCHC: 33.2 g/dL (ref 31.5–36.0)
MCV: 90.9 fL (ref 79.5–101.0)
MONO#: 0.6 10*3/uL (ref 0.1–0.9)
MONO%: 12.3 % (ref 0.0–14.0)
NEUT#: 2.2 10*3/uL (ref 1.5–6.5)
NEUT%: 46.4 % (ref 38.4–76.8)
Platelets: 225 10*3/uL (ref 145–400)
RBC: 4.27 10*6/uL (ref 3.70–5.45)
RDW: 18 % — ABNORMAL HIGH (ref 11.2–14.5)
WBC: 4.7 10*3/uL (ref 3.9–10.3)
lymph#: 1.7 10*3/uL (ref 0.9–3.3)
nRBC: 0 % (ref 0–0)

## 2015-01-17 MED ORDER — SODIUM CHLORIDE 0.9 % IJ SOLN
10.0000 mL | INTRAMUSCULAR | Status: DC | PRN
  Filled 2015-01-17: qty 10

## 2015-01-17 NOTE — Progress Notes (Signed)
Pt in for PIV for MRI today with labs. Pt states she was stuck approx 6 times when she went for her MRI 2 weeks ago at GI. States they had her come here to [facility] before her appt with MRI today to hopefully prevent multiple sticks and obtain CBC. 3 nurses attempted a total of 5 sticks in right arm without success. Informed Dr. Doris Cheadle who stated the left arm may be used today if patient agrees to it, if no success MRI may be cancelled today and surgery will be done with the use of her tomography she already had done.  Before sticking left arm, called GI to inform them pt would be late they stated her appt was cancelled already because it was to close to the appt time, CBC was collected by phlebotomist in right [facility] with no complications. Pt has appt with Dr. Jana Hakim tomorrow to discuss further options.

## 2015-01-18 ENCOUNTER — Ambulatory Visit (HOSPITAL_BASED_OUTPATIENT_CLINIC_OR_DEPARTMENT_OTHER): Payer: BC Managed Care – PPO | Admitting: Oncology

## 2015-01-18 VITALS — BP 157/73 | HR 95 | Temp 97.9°F | Wt 364.2 lb

## 2015-01-18 DIAGNOSIS — C50411 Malignant neoplasm of upper-outer quadrant of right female breast: Secondary | ICD-10-CM | POA: Diagnosis not present

## 2015-01-18 NOTE — Progress Notes (Signed)
Visit  [place]  Telephone:([phone] Fax:(336) [phone]     ID: EVELEEN MCNEAR DOB: [birth year]  MR#: [id number]  LEX#:[id number]  Patient Care Team: Helane Rima, MD as PCP - General (Family Medicine) Thea Silversmith, MD as Consulting Physician (Radiation Oncology) Holley Bouche, NP as Nurse Practitioner (Nurse Practitioner) Chauncey Cruel, MD as Consulting Physician (Oncology) Alphonsa Overall, MD as Consulting Physician (General Surgery) PCP: Helane Rima, MD GYN: OTHER MD: [name] MD  CHIEF COMPLAINT: Stage IV breast cancer  CURRENT TREATMENT: Anastrozole, palbociclib   BREAST CANCER HISTORY: From Dr Krista Blue [facility] 03/11/2014 intake note:  "She was diagnosed with stage III left breast cancer in 1991 when she was 54 years old. Unfortunately her breast cancer diagnosis and treatment history was not available for review today. Apparently she had left mastectomy, and 4 cycles of adjuvant chemotherapy under Dr. Mariana Kaufman care at our cancer center. Patient does not recall the name of the chemotherapy medication. She was also offered a clinical trial of bone marrow transplant for her breast cancer and she declined. She was followed by January mammograms and had no evidence of recurrence. Her last screening mammogram was in 2009 which was benign.  She noticed a lump in her right breast about 20 weeks ago. It was hard and the tender on palpitation. She denies any skin change or nipple discharge. She has no appetite change, weight loss, or other new symptoms. She had underwent a diagnostic mammogram on 01/12/2014, which showed a 3.6 cm lobulated solid mass in right upper quadrant of right breast, and an oval axillary lymph nodes was cortical thickening. 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 Her right axillary lymph node biopsy also showed invasive  adenocarcinoma."  Her subsequent history is as detailed below  INTERVAL HISTORY: Quinesha returns today for follow-up of her estrogen receptor positive breast cancer. She had been set up for a preoperative MRI 01/17/2015, but there were unable to start an IV for the test, even though multiple attempts were made both in radiology and here.--Otherwise she continues on anastrozole, which she is tolerating essentially with no side effects, and today was the first day of her current cycle of Palbociclib.   REVIEW OF SYSTEMS: She continues to work full-time. She tells me her daughter is less anxious about her at this point. Santrice still has significant problems with her knees and this does limit her ability to exercise. A detailed review of systems was otherwise stable  PAST MEDICAL HISTORY: Past Medical History  Diagnosis Date  . Breast cancer   . Hypertension   . Gout   . Arthritis   . Morbid obesity with body mass index of 50.0-59.9 in adult     PAST SURGICAL HISTORY: Past Surgical History  Procedure Laterality Date  . Mastectomy Left   . Tonsillectomy      FAMILY HISTORY Family History  Problem Relation Age of Onset  . Colon cancer Father    the patient's father died at 28 from colon cancer which was diagnosed shortly before his death. The patient's mother is living, age 53 as of July 2016. The patient is an only child. The only other cancer in the family to her knowledge is a paternal great aunt who developed breast cancer in her late 17s. There is no history of ovarian cancer and no other history of colon cancer in the family to her knowledge  GYNECOLOGIC HISTORY:  No LMP recorded. Patient is postmenopausal. menarche age 26 first live birth age 17 the patient is [place] P3. She stopped having periods in 2013. She did not take hormone replacement.   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 At home is just she and her son [name], 54 years old. Son  [name], 27, lives in [name] where he works as a Fish farm manager. Son [name], 25, lives in [place] and is working on a Oceanographer in counseling at [employer]. The patient has one granddaughter, [name], in [name]. She is not a church attender    ADVANCED DIRECTIVES: Not in place   HEALTH MAINTENANCE: Social History  Substance Use Topics  . Smoking status: Never Smoker   . Smokeless tobacco: Not on file  . Alcohol Use: 1.2 oz/week    2 Glasses of wine per week     Colonoscopy: remote/ [name]  PAP: 2014  Bone density:  Lipid panel:  Allergies  Allergen Reactions  . Iodinated Diagnostic Agents Anaphylaxis  . Penicillins Rash    Current Outpatient Prescriptions  Medication Sig Dispense Refill  . amLODipine-olmesartan (AZOR) 10-40 MG tablet Take 1 tablet by mouth daily.    Marland Kitchen anastrozole (ARIMIDEX) 1 MG tablet Take 1 tablet by mouth daily.    Marland Kitchen lovastatin (MEVACOR) 40 MG tablet Take 40 mg by mouth daily.    . Calcium Carb-Cholecalciferol (CALTRATE 600+D) 600-800 MG-UNIT TABS Take by mouth 2 (two) times daily. 60 tablet   . gabapentin (NEURONTIN) 300 MG capsule Take 1 capsule (300 mg total) by mouth at bedtime. (Patient not taking: Reported on 01/18/2015) 90 capsule 4  . ibuprofen (ADVIL,MOTRIN) 200 MG tablet Take 200 mg by mouth as needed.    . palbociclib (IBRANCE) 100 MG capsule Take 1 capsule (100 mg total) by mouth daily with breakfast. Take whole with food for 21 days, rest 7 days and repeat. 21 capsule 6   No current facility-administered medications for this visit.    OBJECTIVE: Middle-aged African-American woman who appears stated age 54 Vitals:   01/18/15 1720  BP: 157/73  Pulse: 95  Temp: 97.9 F (36.6 C)     Body mass index is 57.04 kg/(m^2).    ECOG FS:1 - Symptomatic but completely ambulatory  Sclerae unicteric, pupils round and equal Oropharynx clear and moist-- no thrush or other lesions No cervical or supraclavicular  adenopathy Lungs no rales or rhonchi Heart regular rate and rhythm Abd soft, obese, nontender, positive bowel sounds MSK no focal spinal tenderness, no upper extremity lymphedema Neuro: nonfocal, well oriented, appropriate affect Breasts: Deferred   LAB RESULTS: Results for AAMNA, MALLOZZI (MRN [id number]) as of 01/19/2015 17:50  Ref. Range 09/21/2014 16:15 11/08/2014 15:58 12/06/2014 16:06 12/27/2014 16:07 01/12/2015 15:51  CA 27.29 Latest Ref Range: 0-39 U/mL 163 (H) 132 (H) 101 (H) 95 (H) 103 (H)   CMP     Component Value Date/Time   NA 142 01/12/2015 1551   NA 140 11/09/2010 0921   K 3.9 01/12/2015 1551   K 4.6 11/09/2010 0921   CL 104 11/09/2010 0921   CO2 24 01/12/2015 1551   GLUCOSE 92 01/12/2015 1551   GLUCOSE 111* 11/09/2010 0921   BUN 9.9 01/12/2015 1551   BUN 22 11/09/2010 0921   CREATININE 0.9 01/12/2015 1551   CREATININE 1.10 11/09/2010 0921   CALCIUM 8.7 01/12/2015 1551   PROT 7.0 01/12/2015 1551   ALBUMIN 3.4* 01/12/2015 1551   AST 13  01/12/2015 1551   ALT 12 01/12/2015 1551   ALKPHOS 93 01/12/2015 1551   BILITOT 0.62 01/12/2015 1551    INo results found for: SPEP, UPEP  Lab Results  Component Value Date   WBC 4.7 01/17/2015   NEUTROABS 2.2 01/17/2015   HGB 12.9 01/17/2015   HCT 38.8 01/17/2015   MCV 90.9 01/17/2015   PLT 225 01/17/2015      Chemistry      Component Value Date/Time   NA 142 01/12/2015 1551   NA 140 11/09/2010 0921   K 3.9 01/12/2015 1551   K 4.6 11/09/2010 0921   CL 104 11/09/2010 0921   CO2 24 01/12/2015 1551   BUN 9.9 01/12/2015 1551   BUN 22 11/09/2010 0921   CREATININE 0.9 01/12/2015 1551   CREATININE 1.10 11/09/2010 0921      Component Value Date/Time   CALCIUM 8.7 01/12/2015 1551   ALKPHOS 93 01/12/2015 1551   AST 13 01/12/2015 1551   ALT 12 01/12/2015 1551   BILITOT 0.62 01/12/2015 1551       Lab Results  Component Value Date   LABCA2 103* 01/12/2015    No components found for: [id number]  No results for  input(s): INR in the last 168 hours.  Urinalysis    Component Value Date/Time   LABSPEC 1.015 04/27/2007 1654   PHURINE 6.0 04/27/2007 1654   GLUCOSEU NEGATIVE 04/27/2007 1654   HGBUR NEGATIVE 04/27/2007 1654   BILIRUBINUR NEGATIVE 04/27/2007 1654   KETONESUR NEGATIVE 04/27/2007 1654   PROTEINUR NEGATIVE 04/27/2007 1654   UROBILINOGEN 0.2 04/27/2007 1654   NITRITE NEGATIVE 04/27/2007 1654   LEUKOCYTESUR  04/27/2007 1654    NEGATIVE Biochemical Testing Only. Please order routine urinalysis from main lab if confirmatory testing is needed.  Results for KARLEE, STAFF (MRN [id number]) as of 11/14/2014 15:44  Ref. Range 09/21/2014 16:15 11/08/2014 15:58  Vit D, 25-Hydroxy Latest Ref Range: 30-100 ng/mL 8 (L) 13 (L)   STUDIES: No results found.  ASSESSMENT: 54 y.o. [place] woman  (1) status post left mastectomy in 1991 followed by adjuvant chemotherapy 4, no further details available  (2) status post right breast biopsy 01/13/2014 for a  cT2 p N1, stage IIB invasive ductal carcinoma, estrogen and progesterone receptor positive, HER-2 negative, with an MIB-1 of 10%.  METASTATIC DISEASE: CT scan 03/18/2014 shows multiple sclerotic bone mets but no visceral disease  (1) no bone biopsy obtained to this point  (b) CA 27-29 is informative  (3) anastrozole started January 2016  (a)  palbociclib added 11/21/2014 at 125 mg daily, 21/7  (b) Palbociclib dose decreased to 100 mg daily, 21/ 7, with cycle 2, starting 12/21/2014  (4) vitamin D deficiency: On replacement  (5) local treatment: The patient has been set up for mammography, MRI, and referral to surgery 12/13/2014  (6) denosumab/ Xgeva started 12/15/2014, repeated monthly  PLAN: Brihany has been unable to get her breast MRI done because of access problems. She tells me she is willing to try again, but I wonder if we could proceed to definitive surgery without the breast MRI. I will ask Dr. Lucia Gaskins regarding that.  Otherwise we are  continuing on the anastrozole and she just started the Palbociclib. She is tolerating these remarkably well. I am setting her up for continuing routine lab work until we can be sure she will be able to take the current Palbociclib dose without significant cytopenias.  She will return to see me in early December. She knows to call for  any other problems that may develop before next visit here.     Chauncey Cruel, MD   01/19/2015 5:49 PM Medical Oncology and Hematology [facility] [address] [place], [place] [address] Tel. [phone]    Fax. [phone]

## 2015-01-24 ENCOUNTER — Other Ambulatory Visit (HOSPITAL_BASED_OUTPATIENT_CLINIC_OR_DEPARTMENT_OTHER): Payer: BC Managed Care – PPO

## 2015-01-24 DIAGNOSIS — C50411 Malignant neoplasm of upper-outer quadrant of right female breast: Secondary | ICD-10-CM | POA: Diagnosis not present

## 2015-01-24 LAB — CBC WITH DIFFERENTIAL/PLATELET
BASO%: 2.7 % — AB (ref 0.0–2.0)
Basophils Absolute: 0.1 10*3/uL (ref 0.0–0.1)
EOS%: 3.8 % (ref 0.0–7.0)
Eosinophils Absolute: 0.2 10*3/uL (ref 0.0–0.5)
HEMATOCRIT: 36.3 % (ref 34.8–46.6)
HEMOGLOBIN: 11.8 g/dL (ref 11.6–15.9)
LYMPH#: 1.7 10*3/uL (ref 0.9–3.3)
LYMPH%: 32.3 % (ref 14.0–49.7)
MCH: 29.7 pg (ref 25.1–34.0)
MCHC: 32.5 g/dL (ref 31.5–36.0)
MCV: 91.5 fL (ref 79.5–101.0)
MONO#: 0.3 10*3/uL (ref 0.1–0.9)
MONO%: 6.3 % (ref 0.0–14.0)
NEUT%: 54.9 % (ref 38.4–76.8)
NEUTROS ABS: 2.9 10*3/uL (ref 1.5–6.5)
Platelets: 287 10*3/uL (ref 145–400)
RBC: 3.97 10*6/uL (ref 3.70–5.45)
RDW: 20.6 % — AB (ref 11.2–14.5)
WBC: 5.3 10*3/uL (ref 3.9–10.3)

## 2015-02-09 ENCOUNTER — Telehealth: Payer: Self-pay | Admitting: Oncology

## 2015-02-09 ENCOUNTER — Ambulatory Visit (HOSPITAL_BASED_OUTPATIENT_CLINIC_OR_DEPARTMENT_OTHER): Payer: BC Managed Care – PPO

## 2015-02-09 ENCOUNTER — Ambulatory Visit (HOSPITAL_BASED_OUTPATIENT_CLINIC_OR_DEPARTMENT_OTHER): Payer: BC Managed Care – PPO | Admitting: Oncology

## 2015-02-09 ENCOUNTER — Other Ambulatory Visit (HOSPITAL_BASED_OUTPATIENT_CLINIC_OR_DEPARTMENT_OTHER): Payer: BC Managed Care – PPO

## 2015-02-09 VITALS — BP 172/82 | HR 92 | Temp 97.7°F | Resp 18 | Ht 67.0 in | Wt 359.1 lb

## 2015-02-09 DIAGNOSIS — C50911 Malignant neoplasm of unspecified site of right female breast: Secondary | ICD-10-CM | POA: Diagnosis not present

## 2015-02-09 DIAGNOSIS — C50411 Malignant neoplasm of upper-outer quadrant of right female breast: Secondary | ICD-10-CM

## 2015-02-09 DIAGNOSIS — C7951 Secondary malignant neoplasm of bone: Secondary | ICD-10-CM | POA: Diagnosis not present

## 2015-02-09 DIAGNOSIS — C773 Secondary and unspecified malignant neoplasm of axilla and upper limb lymph nodes: Secondary | ICD-10-CM | POA: Diagnosis not present

## 2015-02-09 DIAGNOSIS — C50919 Malignant neoplasm of unspecified site of unspecified female breast: Secondary | ICD-10-CM

## 2015-02-09 DIAGNOSIS — E559 Vitamin D deficiency, unspecified: Secondary | ICD-10-CM

## 2015-02-09 DIAGNOSIS — Z170 Estrogen receptor positive status [ER+]: Secondary | ICD-10-CM

## 2015-02-09 LAB — COMPREHENSIVE METABOLIC PANEL
ALT: 12 U/L (ref 0–55)
AST: 14 U/L (ref 5–34)
Albumin: 3.8 g/dL (ref 3.5–5.0)
Alkaline Phosphatase: 69 U/L (ref 40–150)
Anion Gap: 9 mEq/L (ref 3–11)
BUN: 15.6 mg/dL (ref 7.0–26.0)
CALCIUM: 9.7 mg/dL (ref 8.4–10.4)
CHLORIDE: 108 meq/L (ref 98–109)
CO2: 25 meq/L (ref 22–29)
CREATININE: 1 mg/dL (ref 0.6–1.1)
EGFR: 79 mL/min/{1.73_m2} — ABNORMAL LOW (ref >90)
Glucose: 99 mg/dl (ref 70–140)
POTASSIUM: 4.2 meq/L (ref 3.5–5.1)
SODIUM: 142 meq/L (ref 136–145)
Total Bilirubin: 0.93 mg/dL (ref 0.20–1.20)
Total Protein: 7.6 g/dL (ref 6.4–8.3)

## 2015-02-09 LAB — CBC WITH DIFFERENTIAL/PLATELET
BASO%: 2.5 % — AB (ref 0.0–2.0)
BASOS ABS: 0.1 10*3/uL (ref 0.0–0.1)
EOS%: 3.9 % (ref 0.0–7.0)
Eosinophils Absolute: 0.1 10*3/uL (ref 0.0–0.5)
HCT: 40.1 % (ref 34.8–46.6)
HGB: 13.3 g/dL (ref 11.6–15.9)
LYMPH#: 1.4 10*3/uL (ref 0.9–3.3)
LYMPH%: 39.4 % (ref 14.0–49.7)
MCH: 31.2 pg (ref 25.1–34.0)
MCHC: 33.2 g/dL (ref 31.5–36.0)
MCV: 94.1 fL (ref 79.5–101.0)
MONO#: 0.3 10*3/uL (ref 0.1–0.9)
MONO%: 7.2 % (ref 0.0–14.0)
NEUT#: 1.7 10*3/uL (ref 1.5–6.5)
NEUT%: 47 % (ref 38.4–76.8)
Platelets: 260 10*3/uL (ref 145–400)
RBC: 4.26 10*6/uL (ref 3.70–5.45)
RDW: 17.6 % — ABNORMAL HIGH (ref 11.2–14.5)
WBC: 3.6 10*3/uL — ABNORMAL LOW (ref 3.9–10.3)

## 2015-02-09 MED ORDER — DENOSUMAB 120 MG/1.7ML ~~LOC~~ SOLN
120.0000 mg | Freq: Once | SUBCUTANEOUS | Status: AC
  Administered 2015-02-09: 120 mg via SUBCUTANEOUS
  Filled 2015-02-09: qty 1.7

## 2015-02-09 NOTE — Telephone Encounter (Signed)
Appointments made and avs printed for pateint °

## 2015-02-09 NOTE — Progress Notes (Signed)
Visit  [place]  Telephone:(336) [phone] Fax:(336) [phone]     ID: Danielle Oliver DOB: 1960-05-15  MR#: [id number]  UUV#:[id number]  Patient Care Team: Danielle Rima, MD as PCP - General (Family Medicine) Danielle Silversmith, MD as Consulting Physician (Radiation Oncology) Danielle Bouche, NP as Nurse Practitioner (Nurse Practitioner) Danielle Cruel, MD as Consulting Physician (Oncology) Danielle Overall, MD as Consulting Physician (General Surgery) PCP: Danielle Rima, MD GYN: OTHER MD: Danielle Heck MD  CHIEF COMPLAINT: Stage IV breast cancer  CURRENT TREATMENT: Anastrozole, palbociclib, denosumab   BREAST CANCER HISTORY: From Danielle Oliver [facility] 03/11/2014 intake note:  "She was diagnosed with stage III left breast cancer in 1991 when she was 54 years old. Unfortunately her breast cancer diagnosis and treatment history was not available for review today. Apparently she had left mastectomy, and 4 cycles of adjuvant chemotherapy under Danielle. Mariana Oliver care at our cancer center. Patient does not recall the name of the chemotherapy medication. She was also offered a clinical trial of bone marrow transplant for her breast cancer and she declined. She was followed by January mammograms and had no evidence of recurrence. Her last screening mammogram was in 2009 which was benign.  She noticed a lump in her right breast about 20 weeks ago. It was hard and the tender on palpitation. She denies any skin change or nipple discharge. She has no appetite change, weight loss, or other new symptoms. She had underwent a diagnostic mammogram on 01/12/2014, which showed a 3.6 cm lobulated solid mass in right upper quadrant of right breast, and an oval axillary lymph nodes was cortical thickening. 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 Her right axillary lymph node biopsy also showed  invasive adenocarcinoma."  Her subsequent history is as detailed below  INTERVAL HISTORY: Danielle Oliver returns today for follow-up of her estrogen receptor positive metastatic breast cancer. Since her last visit here she met with Danielle Oliver and she is scheduled for lumpectomy and sentinel lymph node sampling 02/17/2015.  She continues on anastrozole. Hot flashes have become much less of a problem. She identifies no side effects from the Palbociclib except perhaps minimal fatigue. She is obtaining both drugs at a good price.  REVIEW OF SYSTEMS: Danielle Oliver has a little bit of arthritis in the third digit of her right hand. Recall that she is status post right wrist fracture remotely. She had a little bit of a cold last weekend which she treated with Zyrtec, with resolution. She still has a minimal dry cough left from that. Otherwise a detailed review of systems today was benign  PAST MEDICAL HISTORY: Past Medical History  Diagnosis Date  . Breast cancer   . Hypertension   . Gout   . Arthritis   . Morbid obesity with body mass index of 50.0-59.9 in adult     PAST SURGICAL HISTORY: Past Surgical History  Procedure Laterality Date  . Mastectomy Left   . Tonsillectomy      FAMILY HISTORY Family History  Problem Relation Age of Onset  . Colon cancer Father    the patient's father died at 39 from colon cancer which was diagnosed shortly before his death. The patient's mother is living, age 74 as of July 2016. The patient is an only child. The only other cancer in the family to her knowledge is a paternal great aunt who developed breast cancer in her late 64s.  There is no history of ovarian cancer and no other history of colon cancer in the family to her knowledge  GYNECOLOGIC HISTORY:  No LMP recorded. Patient is postmenopausal. menarche age 41 first live birth age 41 the patient is [place] P3. She stopped having periods in 2013. She did not take hormone replacement.   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 At home is just she and her son Danielle Oliver, 33 years old. Son Danielle Oliver, 27, lives in [name] where he works as a Fish farm manager. Son Danielle Oliver, 25, lives in [place] and is working on a Oceanographer in counseling at [employer]. The patient has one granddaughter, Danielle Oliver, in [name]. She is not a church attender    ADVANCED DIRECTIVES: Not in place   HEALTH MAINTENANCE: Social History  Substance Use Topics  . Smoking status: Never Smoker   . Smokeless tobacco: Not on file  . Alcohol Use: 1.2 oz/week    2 Glasses of wine per week     Colonoscopy: remote/ Danielle Oliver  PAP: 2014  Bone density:  Lipid panel:  Allergies  Allergen Reactions  . Iodinated Diagnostic Agents Anaphylaxis  . Penicillins Rash    Current Outpatient Prescriptions  Medication Sig Dispense Refill  . amLODipine-olmesartan (AZOR) 10-40 MG tablet Take 1 tablet by mouth daily.    Marland Kitchen anastrozole (ARIMIDEX) 1 MG tablet Take 1 tablet by mouth daily.    . Calcium Carb-Cholecalciferol (CALTRATE 600+D) 600-800 MG-UNIT TABS Take by mouth 2 (two) times daily. 60 tablet   . gabapentin (NEURONTIN) 300 MG capsule Take 1 capsule (300 mg total) by mouth at bedtime. (Patient not taking: Reported on 01/18/2015) 90 capsule 4  . ibuprofen (ADVIL,MOTRIN) 200 MG tablet Take 200 mg by mouth as needed.    . lovastatin (MEVACOR) 40 MG tablet Take 40 mg by mouth daily.    . palbociclib (IBRANCE) 100 MG capsule Take 1 capsule (100 mg total) by mouth daily with breakfast. Take whole with food for 21 days, rest 7 days and repeat. 21 capsule 6   No current facility-administered medications for this visit.    OBJECTIVE: Middle-aged African-American woman who appears well Filed Vitals:   02/09/15 0824  BP: 172/82  Pulse: 92  Temp: 97.7 F (36.5 C)  Resp: 18     Body mass index is 56.23 kg/(m^2).    ECOG FS:1 - Symptomatic but completely ambulatory  Sclerae unicteric, pupils round  and equal Oropharynx clear and moist-- no thrush or other lesions No cervical or supraclavicular adenopathy Lungs no rales or rhonchi Heart regular rate and rhythm Abd soft, obese, nontender, positive bowel sounds MSK no focal spinal tenderness, no upper extremity lymphedema Neuro: nonfocal, well oriented, appropriate affect Breasts: The mass in the right breast is not easily palpable at present. There are no significant skin or nipple changes. The right axilla is benign. The left breast is status post mastectomy. There is no evidence of local recurrence. The left axilla is benign.   LAB RESULTS:  CMP     Component Value Date/Time   NA 142 02/09/2015 0813   NA 140 11/09/2010 0921   K 4.2 02/09/2015 0813   K 4.6 11/09/2010 0921   CL 104 11/09/2010 0921   CO2 25 02/09/2015 0813   GLUCOSE 99 02/09/2015 0813   GLUCOSE 111* 11/09/2010 0921   BUN 15.6 02/09/2015 0813   BUN 22 11/09/2010 0921   CREATININE 1.0 02/09/2015 0813   CREATININE 1.10 11/09/2010  6761   CALCIUM 9.7 02/09/2015 0813   PROT 7.6 02/09/2015 0813   ALBUMIN 3.8 02/09/2015 0813   AST 14 02/09/2015 0813   ALT 12 02/09/2015 0813   ALKPHOS 69 02/09/2015 0813   BILITOT 0.93 02/09/2015 0813    INo results found for: SPEP, UPEP  Lab Results  Component Value Date   WBC 3.6* 02/09/2015   NEUTROABS 1.7 02/09/2015   HGB 13.3 02/09/2015   HCT 40.1 02/09/2015   MCV 94.1 02/09/2015   PLT 260 02/09/2015      Chemistry      Component Value Date/Time   NA 142 02/09/2015 0813   NA 140 11/09/2010 0921   K 4.2 02/09/2015 0813   K 4.6 11/09/2010 0921   CL 104 11/09/2010 0921   CO2 25 02/09/2015 0813   BUN 15.6 02/09/2015 0813   BUN 22 11/09/2010 0921   CREATININE 1.0 02/09/2015 0813   CREATININE 1.10 11/09/2010 0921      Component Value Date/Time   CALCIUM 9.7 02/09/2015 0813   ALKPHOS 69 02/09/2015 0813   AST 14 02/09/2015 0813   ALT 12 02/09/2015 0813   BILITOT 0.93 02/09/2015 0813       Lab Results    Component Value Date   LABCA2 103* 01/12/2015    No components found for: [id number]  No results for input(s): INR in the last 168 hours.  Urinalysis    Component Value Date/Time   LABSPEC 1.015 04/27/2007 1654   PHURINE 6.0 04/27/2007 1654   GLUCOSEU NEGATIVE 04/27/2007 1654   HGBUR NEGATIVE 04/27/2007 1654   BILIRUBINUR NEGATIVE 04/27/2007 1654   KETONESUR NEGATIVE 04/27/2007 1654   PROTEINUR NEGATIVE 04/27/2007 1654   UROBILINOGEN 0.2 04/27/2007 1654   NITRITE NEGATIVE 04/27/2007 1654   LEUKOCYTESUR  04/27/2007 1654    NEGATIVE Biochemical Testing Only. Please order routine urinalysis from main lab if confirmatory testing is needed.   STUDIES: No results found.  ASSESSMENT: 54 y.o. Danielle Oliver woman  (1) status post left mastectomy in 1991 followed by adjuvant chemotherapy 4, no further details available  (2) status post right breast biopsy 01/13/2014 for a  cT2 p N1, stage IIB invasive ductal carcinoma, estrogen and progesterone receptor positive, HER-2 negative, with an MIB-1 of 10%.  METASTATIC DISEASE: CT scan 03/18/2014 shows multiple sclerotic bone mets but no visceral disease  (1) no bone biopsy obtained to this point  (b) CA 27-29 is informative  (3) anastrozole started January 2016  (a)  palbociclib added 11/21/2014 at 125 mg daily, 21/7  (b) Palbociclib dose decreased to 100 mg daily, 21/ 7, with cycle 2, starting 12/21/2014  (4) vitamin D deficiency: On replacement  (5) local treatment: The patient has been set up for mammography, MRI, and referral to surgery 12/13/2014  (6) denosumab Xgeva started 12/15/2014, repeated monthly  (7) genetics testing pending  PLAN: Cicilia is tolerating her anastrozole and Palbociclib well. We are not going to resume the Palbociclib until December 19 however I just to make sure there is no interference with her upcoming surgery.  I would like to obtain restaging studies later this month. I have put her in for both a CT of  the chest and a PET scan. She is going to investigate cost issues and let me know which of the 2 tests she would be able to afford.  She is scheduled for lumpectomy and sentinel lymph node sampling next week. We will call her with a pathology results once they become available but in any  case she will see me again the first Thursday of January, when she receives her next Denosumab dose.  At this point I am very encouraged that she is tolerating treatment so well and hopeful that we will be able to document a good response with the restaging studies. She knows to call for any problems that may develop before her next visit.   Danielle Cruel, MD   02/09/2015 8:55 AM Medical Oncology and Hematology [facility] [address] [place],  [address] Tel. [phone]    Fax. [phone]

## 2015-02-10 LAB — VITAMIN D 25 HYDROXY (VIT D DEFICIENCY, FRACTURES): Vit D, 25-Hydroxy: 21 ng/mL — ABNORMAL LOW (ref 30–100)

## 2015-02-10 LAB — CANCER ANTIGEN 27.29: CA 27.29: 94 U/mL — AB (ref 0–39)

## 2015-02-14 ENCOUNTER — Other Ambulatory Visit: Payer: Self-pay | Admitting: Surgery

## 2015-02-14 DIAGNOSIS — C50911 Malignant neoplasm of unspecified site of right female breast: Secondary | ICD-10-CM

## 2015-02-14 NOTE — Progress Notes (Signed)
Parient's BMI is 56.3 and her surgery will need to be done at [facility]. Debbie at [facility] office notified.

## 2015-02-15 ENCOUNTER — Other Ambulatory Visit: Payer: Self-pay | Admitting: Oncology

## 2015-02-15 NOTE — Progress Notes (Unsigned)
Dr. Lucia Gaskins called regarding the mention of sentinel lymph node biopsy in my notes. In general we do not feel this needs to be done in patients with stage IV disease, because it does not affect survival and what we are really concerned with is local recurrence on the chest wall. The plan accordingly will be for simple mastectomy.

## 2015-02-16 ENCOUNTER — Encounter (HOSPITAL_COMMUNITY)
Admission: RE | Admit: 2015-02-16 | Discharge: 2015-02-16 | Disposition: A | Discharge status: Home / Self Care | Payer: BC Managed Care – PPO | Source: Ambulatory Visit | Attending: Surgery | Admitting: Surgery

## 2015-02-16 ENCOUNTER — Encounter (HOSPITAL_COMMUNITY): Payer: Self-pay

## 2015-02-16 DIAGNOSIS — Z171 Estrogen receptor negative status [ER-]: Secondary | ICD-10-CM | POA: Diagnosis not present

## 2015-02-16 DIAGNOSIS — M1990 Unspecified osteoarthritis, unspecified site: Secondary | ICD-10-CM | POA: Diagnosis not present

## 2015-02-16 DIAGNOSIS — Z6841 Body Mass Index (BMI) 40.0 and over, adult: Secondary | ICD-10-CM | POA: Diagnosis not present

## 2015-02-16 DIAGNOSIS — C50411 Malignant neoplasm of upper-outer quadrant of right female breast: Secondary | ICD-10-CM | POA: Diagnosis not present

## 2015-02-16 DIAGNOSIS — C50911 Malignant neoplasm of unspecified site of right female breast: Secondary | ICD-10-CM | POA: Diagnosis present

## 2015-02-16 DIAGNOSIS — Z880 Allergy status to penicillin: Secondary | ICD-10-CM | POA: Diagnosis not present

## 2015-02-16 DIAGNOSIS — C7951 Secondary malignant neoplasm of bone: Secondary | ICD-10-CM | POA: Diagnosis not present

## 2015-02-16 DIAGNOSIS — I10 Essential (primary) hypertension: Secondary | ICD-10-CM | POA: Diagnosis not present

## 2015-02-16 HISTORY — DX: Reserved for inherently not codable concepts without codable children: IMO0001

## 2015-02-16 HISTORY — DX: Pneumonia, unspecified organism: J18.9

## 2015-02-16 HISTORY — DX: Cardiac murmur, unspecified: R01.1

## 2015-02-16 HISTORY — DX: Cardiac arrhythmia, unspecified: I49.9

## 2015-02-16 LAB — CBC
HEMATOCRIT: 39.2 % (ref 36.0–46.0)
Hemoglobin: 13.1 g/dL (ref 12.0–15.0)
MCH: 31.5 pg (ref 26.0–34.0)
MCHC: 33.4 g/dL (ref 30.0–36.0)
MCV: 94.2 fL (ref 78.0–100.0)
PLATELETS: 209 10*3/uL (ref 150–400)
RBC: 4.16 MIL/uL (ref 3.87–5.11)
RDW: 17.5 % — ABNORMAL HIGH (ref 11.5–15.5)
WBC: 6.5 10*3/uL (ref 4.0–10.5)

## 2015-02-16 LAB — BASIC METABOLIC PANEL
Anion gap: 7 (ref 5–15)
BUN: 11 mg/dL (ref 6–20)
CHLORIDE: 110 mmol/L (ref 101–111)
CO2: 26 mmol/L (ref 22–32)
CREATININE: 0.81 mg/dL (ref 0.44–1.00)
Calcium: 8.9 mg/dL (ref 8.9–10.3)
Glucose, Bld: 112 mg/dL — ABNORMAL HIGH (ref 65–99)
POTASSIUM: 4.1 mmol/L (ref 3.5–5.1)
SODIUM: 143 mmol/L (ref 135–145)

## 2015-02-16 MED ORDER — VANCOMYCIN HCL 10 G IV SOLR
1500.0000 mg | INTRAVENOUS | Status: AC
  Administered 2015-02-17: 1500 mg via INTRAVENOUS
  Filled 2015-02-16: qty 1500

## 2015-02-16 NOTE — Pre-Procedure Instructions (Signed)
    Danielle Oliver  02/16/2015    Your procedure is scheduled on Friday, December 16.  Report to [facility] Admitting at 7:15 A.M.               Your surgery or procedure is scheduled for 9:15  Call this number if you have problems the morning of [phone]     Remember:  Do not eat food or drink liquids after midnight.  Take these medicines the morning of surgery with A SIP OF WATER : lovastatin (MEVACOR), anastrozole (ARIMIDEX), palbociclib (IBRANCE).                 Stop Taking Ibupofen (Advil), do not take Aspirin or Aspirin Products, Vitamins or Herbal Products.   Do not wear jewelry, make-up or nail polish.   Do not wear lotions, powders, or perfumes.     Do not shave 48 hours prior to surgery.    Do not bring valuables to the hospital.   [facility] is not responsible for any belongings or valuables.  Contacts, dentures or bridgework may not be worn into surgery.  Leave your suitcase in the car.  After surgery it may be brought to your room.  For patients admitted to the hospital, discharge time will be determined by your treatment team.  Patients discharged the day of surgery will not be allowed to drive home.   Name and phone number of your driver:  -  Special instructions: Review  [facility] - Preparing For Surgery.  Please read over the following fact sheets that you were given. Pain Booklet, Coughing and Deep Breathing and Surgical Site Infection Prevention

## 2015-02-16 NOTE — Progress Notes (Signed)
Anesthesia Chart Review:  Pt is 54 year old female scheduled for R breast lumpectomy with radioactive seed localization on 02/17/2015 with Dr. Lucia Gaskins.   PMH includes:  HTN, heart murmur, dysrhythmia, breast cancer. Never smoker. BMI 57.   Medications include: amlodipine-olmesartan, arimidex, lovastatin, ibrance. [name] is on hold until 12/19 (after surgery).   Preoperative labs reviewed.    EKG 02/16/15: NSR. Cannot rule out Anterior infarct, age undetermined. Appears stable when compared to EKG dated 11/12/97.   If no changes, I anticipate pt can proceed with surgery as scheduled.   Rina Brinser, FNP-BC [facility]/Anesthesiology Phone: [phone] 02/16/2015 4:14 PM

## 2015-02-16 NOTE — H&P (Signed)
Danielle Oliver  Location: [facility] Patient #: [id number] DOB: [birth year] Divorced / Language: Undefined / Race: Undefined Female   History of Present Illness   The patient is a 54 year old female who presents with breast cancer.   His PCP is Dr. Helane Rima.  His oncologist are Drs. [name]/Wentworth.  She comes by herself.   She had a new breast cancer identified in Nov 2015. It looks like the patient originally saw Dr. Brantley Stage in November 2015 for the right breast cancer. But she was identified with metastatic cancer. She saw Dr. Truitt Merle, but then changed to Dr. Jana Hakim for oncologic management of her cancer.  She had underwent a diagnostic mammogram on 01/12/2014, which showed a 3.6 cm lobulated solid mass in right upper quadrant of right breast, and an oval axillary lymph nodes was cortical thickening. 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 Her right axillary lymph node biopsy also showed invasive adenocarcinoma. So she is a cT2 p N1M1 (Note I cannot find her path in Epic - this is from Dr. [name] note)  She was found to have METASTATIC DISEASE: CT scan 03/18/2014 shows multiple sclerotic bone mets but no visceral disease - (1) no bone biopsy obtained to this point (b) CA 27-29 is informative. Because of the metastatic disease - she has not had surgery.  She started anastrozole January 2016 (a) palbociclib added 11/21/2014 at 125 mg daily, 21/7 (b) Palbociclib dose decreased to 100 mg daily, 21/ 7, with cycle 2, starting 12/21/2014  She has gotten to the point where the right breast cancer is bothering her and she wants it removed. Her bone disease is stable. She had a bone scan on 10/20/2014 - 1. Abnormal uptake within the calvarium, midshaft of the right humerus, and  medial aspect of the left iliac bone which may reflect known metastatic disease. The widespread sclerotic lesions demonstrated on the previous CT scan are not abnormal on today's scan. Plain films of the calvarium, right humerus, and pelvis would be useful. 2. Increased uptake in the knees and ankles most compatible with degenerative change.  Her most recent mammogram at [facility] was 12/26/2014 - showed 2.6 cm right upper outer quadran mass. This is decreased from 3.7 x 3.3 cm mass. So she has had some response from the anti hormone tx.  Plan: We talked about the options for breast cancer treatment. I discussed mastectomy and lumpectomy. With known mets, the local control is less important, but I think a lumpectomy does make sense. She is for an MRI on 01/02/2015. I'd like to review the plan with Drs. [name] and Pablo Ledger after the MRI.  Past medical history 1. She had stage III left breast cancer in 1991 when she was 54 years old. Dr. Rise Patience did a left mastectomy, Dr. Marko Plume directed her chemotx. It does not sound like she got radiation tx. 2. Morbid Obesity  Social History: Divorced has 3 children: 38 yo son, 30 yo son, 25 yo daughter Works as Passenger transport manager for [employer] Danielle Oliver, CMA; 12/29/2014 8:43 AM) Iodinated Glycerol *COUGH/COLD/ALLERGY* Penicillin VK *PENICILLINS* Rash.  Medication History Danielle Oliver, CMA; 12/29/2014 8:45 AM) Amlodipine-Olmesartan (5-40MG Tablet, Oral) Active. Anastrozole (1MG Tablet, Oral) Active. Cholecalciferol (50000UNIT Tablet, Oral) Active. Allegra (180MG Tablet, Oral) Active. Gabapentin (300MG Capsule, Oral) Active. Lovastatin (40MG Tablet, Oral) Active. Palbociclib (100MG Capsule, Oral) Active. Medications Reconciled  Vitals (  Danielle Oliver CMA; 12/29/2014 8:45 AM) 12/29/2014 8:45 AM Weight: 365 lb Height: 66in Body Surface Area: 2.58 m Body Mass Index: 58.91 kg/m  Temp.: 97.27F(Temporal)   Pulse: 60 (Regular)  BP: 140/82 (Sitting, Left Arm, Standard)   Physical Exam  General: Obese AA F alert and generally healthy appearing. HEENT: Normal. Pupils equal.  Neck: Supple. No mass. No thyroid mass. Lymph Nodes: No supraclavicular, cervical, or axillary nodes.  Lungs: Clear to auscultation and symmetric breath sounds. Heart: RRR. No murmur or rub.  Breasts: Right - 3 cm mass in upper outer quadrant of the right breast  Left - breast is absent, no mass  Abdomen: Soft. No mass. No tenderness. No hernia. Normal bowel sounds. No abdominal scars.  Extremities: Good strength and ROM in upper and lower extremities. Her arms are large.  Neurologic: Grossly intact to motor and sensory function. Psychiatric: Has normal mood and affect. Behavior is normal.  Assessment & Plan: 1.  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 Her right axillary lymph node biopsy also showed invasive adenocarcinoma.  She is a cT2 p N1M1   Oncology - [name]/Wentworth  Impression: Plan:    1) Plan rigth breast lumpectomy with seed.   She wants the surgery in mid December.   I did speak briefly with Dr. Jana Hakim about the plan.    2) Has MRI next week - will review  Addendum Note(Clyda Smyth H. Lucia Gaskins MD; 01/18/2015 10:25 PM) ----- Message ----- From: Chauncey Cruel, MD Sent: 01/18/2015 6:01 PM To: Alphonsa Overall, MD  Gerrie Nordmann-- we have not been able to get a breast MRI on Ms Danielle Oliver because of access problems-- how badly do we need the test?  gus _________________________________________ Gus, It is not critical.  From mammography at [facility] - her right breast mass appears to have decreased from 3.7 cm to 2.6 cm on the mammogram done 12/26/2014.  The MRI would have "reassured" [place] that a lumpectomy would remove all her disease with clear  margins.  But we can go ahead without the MRI.  Talk to you later, Shanon Brow    3) On anastrazole and Ibrance  2.  HISTORY OF LEFT BREAST CANCER (Z85.3)  She had stage III left breast cancer in 1991 when she was 54 years old. Dr. Rise Patience did a left mastectomy, Dr. Marko Plume directed her chemotx.  3.  MORBID OBESITY WITH BMI OF 50.0-59.9, ADULT (E66.01)  Alphonsa Overall, MD, [facility] Surgery Pager: [phone] Office phone:  [phone]

## 2015-02-17 ENCOUNTER — Encounter (HOSPITAL_COMMUNITY)
Admission: RE | Disposition: A | Discharge status: Home / Self Care | Payer: Self-pay | Source: Ambulatory Visit | Attending: Surgery

## 2015-02-17 ENCOUNTER — Encounter (HOSPITAL_COMMUNITY): Payer: Self-pay

## 2015-02-17 ENCOUNTER — Ambulatory Visit (HOSPITAL_COMMUNITY): Payer: BC Managed Care – PPO | Admitting: Certified Registered"

## 2015-02-17 ENCOUNTER — Ambulatory Visit (HOSPITAL_COMMUNITY)
Admission: RE | Admit: 2015-02-17 | Discharge: 2015-02-17 | Disposition: A | Discharge status: Home / Self Care | Payer: BC Managed Care – PPO | Source: Ambulatory Visit | Attending: Surgery | Admitting: Surgery

## 2015-02-17 DIAGNOSIS — M1990 Unspecified osteoarthritis, unspecified site: Secondary | ICD-10-CM | POA: Insufficient documentation

## 2015-02-17 DIAGNOSIS — I10 Essential (primary) hypertension: Secondary | ICD-10-CM | POA: Insufficient documentation

## 2015-02-17 DIAGNOSIS — Z880 Allergy status to penicillin: Secondary | ICD-10-CM | POA: Insufficient documentation

## 2015-02-17 DIAGNOSIS — C50411 Malignant neoplasm of upper-outer quadrant of right female breast: Secondary | ICD-10-CM | POA: Diagnosis not present

## 2015-02-17 DIAGNOSIS — Z171 Estrogen receptor negative status [ER-]: Secondary | ICD-10-CM | POA: Insufficient documentation

## 2015-02-17 DIAGNOSIS — C7951 Secondary malignant neoplasm of bone: Secondary | ICD-10-CM | POA: Insufficient documentation

## 2015-02-17 DIAGNOSIS — Z6841 Body Mass Index (BMI) 40.0 and over, adult: Secondary | ICD-10-CM | POA: Insufficient documentation

## 2015-02-17 HISTORY — PX: BREAST LUMPECTOMY WITH RADIOACTIVE SEED LOCALIZATION: SHX6424

## 2015-02-17 SURGERY — BREAST LUMPECTOMY WITH RADIOACTIVE SEED LOCALIZATION
Anesthesia: General | Site: Breast | Laterality: Right

## 2015-02-17 MED ORDER — EPHEDRINE SULFATE 50 MG/ML IJ SOLN
INTRAMUSCULAR | Status: DC | PRN
  Administered 2015-02-17 (×2): 10 mg via INTRAVENOUS

## 2015-02-17 MED ORDER — HYDROMORPHONE HCL 1 MG/ML IJ SOLN
0.2500 mg | INTRAMUSCULAR | Status: DC | PRN
  Administered 2015-02-17 (×2): 0.5 mg via INTRAVENOUS

## 2015-02-17 MED ORDER — PROPOFOL 10 MG/ML IV BOLUS
INTRAVENOUS | Status: AC
  Filled 2015-02-17: qty 20

## 2015-02-17 MED ORDER — FENTANYL CITRATE (PF) 100 MCG/2ML IJ SOLN
INTRAMUSCULAR | Status: DC | PRN
  Administered 2015-02-17 (×2): 50 ug via INTRAVENOUS
  Administered 2015-02-17: 100 ug via INTRAVENOUS
  Administered 2015-02-17: 50 ug via INTRAVENOUS

## 2015-02-17 MED ORDER — MIDAZOLAM HCL 2 MG/2ML IJ SOLN
INTRAMUSCULAR | Status: AC
  Filled 2015-02-17: qty 2

## 2015-02-17 MED ORDER — 0.9 % SODIUM CHLORIDE (POUR BTL) OPTIME
TOPICAL | Status: DC | PRN
  Administered 2015-02-17: 1000 mL

## 2015-02-17 MED ORDER — MIDAZOLAM HCL 5 MG/5ML IJ SOLN
INTRAMUSCULAR | Status: DC | PRN
  Administered 2015-02-17 (×2): 1 mg via INTRAVENOUS

## 2015-02-17 MED ORDER — PROMETHAZINE HCL 25 MG/ML IJ SOLN: 6.2500 mg | INTRAMUSCULAR | Status: DC | PRN

## 2015-02-17 MED ORDER — SUCCINYLCHOLINE CHLORIDE 20 MG/ML IJ SOLN
INTRAMUSCULAR | Status: DC | PRN
  Administered 2015-02-17: 120 mg via INTRAVENOUS

## 2015-02-17 MED ORDER — BUPIVACAINE-EPINEPHRINE (PF) 0.25% -1:200000 IJ SOLN
INTRAMUSCULAR | Status: AC
  Filled 2015-02-17: qty 30

## 2015-02-17 MED ORDER — EPHEDRINE SULFATE 50 MG/ML IJ SOLN
INTRAMUSCULAR | Status: AC
  Filled 2015-02-17: qty 1

## 2015-02-17 MED ORDER — DEXAMETHASONE SODIUM PHOSPHATE 4 MG/ML IJ SOLN
INTRAMUSCULAR | Status: DC | PRN
  Administered 2015-02-17: 8 mg via INTRAVENOUS

## 2015-02-17 MED ORDER — CHLORHEXIDINE GLUCONATE 4 % EX LIQD: 1.0000 "application " | Freq: Once | CUTANEOUS | Status: DC

## 2015-02-17 MED ORDER — LACTATED RINGERS IV SOLN
INTRAVENOUS | Status: DC
  Administered 2015-02-17 (×2): via INTRAVENOUS

## 2015-02-17 MED ORDER — PHENYLEPHRINE HCL 10 MG/ML IJ SOLN
INTRAMUSCULAR | Status: DC | PRN
  Administered 2015-02-17: 80 ug via INTRAVENOUS
  Administered 2015-02-17 (×2): 40 ug via INTRAVENOUS
  Administered 2015-02-17 (×3): 80 ug via INTRAVENOUS

## 2015-02-17 MED ORDER — PROPOFOL 10 MG/ML IV BOLUS
INTRAVENOUS | Status: DC | PRN
  Administered 2015-02-17: 180 mg via INTRAVENOUS
  Administered 2015-02-17: 20 mg via INTRAVENOUS

## 2015-02-17 MED ORDER — HYDROMORPHONE HCL 1 MG/ML IJ SOLN
INTRAMUSCULAR | Status: DC
Start: 2015-02-17 — End: 2015-02-17
  Filled 2015-02-17: qty 1

## 2015-02-17 MED ORDER — FENTANYL CITRATE (PF) 250 MCG/5ML IJ SOLN
INTRAMUSCULAR | Status: AC
  Filled 2015-02-17: qty 5

## 2015-02-17 MED ORDER — DEXAMETHASONE SODIUM PHOSPHATE 4 MG/ML IJ SOLN
INTRAMUSCULAR | Status: AC
  Filled 2015-02-17: qty 2

## 2015-02-17 MED ORDER — SCOPOLAMINE 1 MG/3DAYS TD PT72
MEDICATED_PATCH | TRANSDERMAL | Status: AC
  Filled 2015-02-17: qty 1

## 2015-02-17 MED ORDER — SODIUM CHLORIDE 0.9 % IV SOLN
INTRAVENOUS | Status: DC | PRN
  Administered 2015-02-17: 09:00:00 via INTRAVENOUS

## 2015-02-17 MED ORDER — LIDOCAINE HCL (CARDIAC) 20 MG/ML IV SOLN
INTRAVENOUS | Status: AC
  Filled 2015-02-17: qty 5

## 2015-02-17 MED ORDER — LIDOCAINE HCL (CARDIAC) 20 MG/ML IV SOLN
INTRAVENOUS | Status: DC | PRN
  Administered 2015-02-17: 80 mg via INTRAVENOUS

## 2015-02-17 MED ORDER — ONDANSETRON HCL 4 MG/2ML IJ SOLN
INTRAMUSCULAR | Status: AC
  Filled 2015-02-17: qty 2

## 2015-02-17 MED ORDER — BUPIVACAINE-EPINEPHRINE 0.25% -1:200000 IJ SOLN
INTRAMUSCULAR | Status: DC | PRN
  Administered 2015-02-17: 30 mL

## 2015-02-17 MED ORDER — HYDROCODONE-ACETAMINOPHEN 5-325 MG PO TABS: 1.0000 | ORAL_TABLET | Freq: Four times a day (QID) | ORAL | Status: DC | PRN

## 2015-02-17 MED ORDER — PHENYLEPHRINE 40 MCG/ML (10ML) SYRINGE FOR IV PUSH (FOR BLOOD PRESSURE SUPPORT)
PREFILLED_SYRINGE | INTRAVENOUS | Status: AC
  Filled 2015-02-17: qty 10

## 2015-02-17 MED ORDER — SCOPOLAMINE 1 MG/3DAYS TD PT72
MEDICATED_PATCH | TRANSDERMAL | Status: DC | PRN
  Administered 2015-02-17: 1 via TRANSDERMAL

## 2015-02-17 MED ORDER — ONDANSETRON HCL 4 MG/2ML IJ SOLN
INTRAMUSCULAR | Status: DC | PRN
  Administered 2015-02-17: 4 mg via INTRAVENOUS

## 2015-02-17 SURGICAL SUPPLY — 43 items
APPLIER CLIP 9.375 MED OPEN (MISCELLANEOUS)
BINDER BREAST LRG (GAUZE/BANDAGES/DRESSINGS) IMPLANT
BINDER BREAST XLRG (GAUZE/BANDAGES/DRESSINGS) IMPLANT
BLADE SURG 15 STRL LF DISP TIS (BLADE) ×1 IMPLANT
BLADE SURG 15 STRL SS (BLADE) ×2
CANISTER SUCTION 2500CC (MISCELLANEOUS) IMPLANT
CHLORAPREP W/TINT 26ML (MISCELLANEOUS) ×3 IMPLANT
CLIP APPLIE 9.375 MED OPEN (MISCELLANEOUS) IMPLANT
COVER PROBE W GEL 5X96 (DRAPES) ×3 IMPLANT
COVER SURGICAL LIGHT HANDLE (MISCELLANEOUS) ×3 IMPLANT
DEVICE DUBIN SPECIMEN MAMMOGRA (MISCELLANEOUS) ×3 IMPLANT
DRAPE CHEST BREAST 15X10 FENES (DRAPES) ×3 IMPLANT
DRAPE UTILITY W/TAPE 26X15 (DRAPES) ×3 IMPLANT
ELECT CAUTERY BLADE 6.4 (BLADE) ×3 IMPLANT
ELECT REM PT RETURN 9FT ADLT (ELECTROSURGICAL) ×3
ELECTRODE REM PT RTRN 9FT ADLT (ELECTROSURGICAL) ×1 IMPLANT
GLOVE SURG SIGNA 7.5 PF LTX (GLOVE) ×3 IMPLANT
GOWN STRL REUS W/ TWL LRG LVL3 (GOWN DISPOSABLE) ×1 IMPLANT
GOWN STRL REUS W/ TWL XL LVL3 (GOWN DISPOSABLE) ×1 IMPLANT
GOWN STRL REUS W/TWL LRG LVL3 (GOWN DISPOSABLE) ×2
GOWN STRL REUS W/TWL XL LVL3 (GOWN DISPOSABLE) ×2
KIT BASIN OR (CUSTOM PROCEDURE TRAY) ×3 IMPLANT
KIT MARKER MARGIN INK (KITS) ×3 IMPLANT
LIQUID BAND (GAUZE/BANDAGES/DRESSINGS) ×3 IMPLANT
NEEDLE HYPO 25X1 1.5 SAFETY (NEEDLE) ×3 IMPLANT
NS IRRIG 1000ML POUR BTL (IV SOLUTION) IMPLANT
PACK SURGICAL SETUP 50X90 (CUSTOM PROCEDURE TRAY) ×3 IMPLANT
PENCIL BUTTON HOLSTER BLD 10FT (ELECTRODE) ×3 IMPLANT
SPONGE GAUZE 4X4 12PLY STER LF (GAUZE/BANDAGES/DRESSINGS) ×3 IMPLANT
SPONGE LAP 18X18 X RAY DECT (DISPOSABLE) ×3 IMPLANT
SUT MNCRL AB 4-0 PS2 18 (SUTURE) ×3 IMPLANT
SUT SILK 2 0 SH (SUTURE) IMPLANT
SUT VIC AB 2-0 SH 27 (SUTURE) ×2
SUT VIC AB 2-0 SH 27XBRD (SUTURE) ×1 IMPLANT
SUT VIC AB 3-0 SH 27 (SUTURE) ×4
SUT VIC AB 3-0 SH 27X BRD (SUTURE) ×2 IMPLANT
SYR BULB 3OZ (MISCELLANEOUS) ×3 IMPLANT
SYR CONTROL 10ML LL (SYRINGE) ×3 IMPLANT
TOWEL OR 17X24 6PK STRL BLUE (TOWEL DISPOSABLE) ×3 IMPLANT
TOWEL OR 17X26 10 PK STRL BLUE (TOWEL DISPOSABLE) ×3 IMPLANT
TUBE CONNECTING 12'X1/4 (SUCTIONS)
TUBE CONNECTING 12X1/4 (SUCTIONS) IMPLANT
YANKAUER SUCT BULB TIP NO VENT (SUCTIONS) IMPLANT

## 2015-02-17 NOTE — Anesthesia Preprocedure Evaluation (Addendum)
Anesthesia Evaluation  Patient identified by MRN, date of birth, ID band Patient awake    Reviewed: Allergy & Precautions, NPO status , Patient's Chart, lab work & pertinent test results, reviewed documented beta blocker date and time   History of Anesthesia Complications (+) PONV  Airway Mallampati: II  TM Distance: >3 FB Neck ROM: Full    Dental  (+) Teeth Intact, Dental Advisory Given   Pulmonary shortness of breath and with exertion, pneumonia,    breath sounds clear to auscultation       Cardiovascular hypertension, Pt. on medications + dysrhythmias  Rhythm:Regular Rate:Normal     Neuro/Psych    GI/Hepatic negative GI ROS, Neg liver ROS,   Endo/Other  negative endocrine ROSMorbid obesity  Renal/GU negative Renal ROS     Musculoskeletal  (+) Arthritis ,   Abdominal   Peds  Hematology   Anesthesia Other Findings There is a notch in the right incisor  Reproductive/Obstetrics                            Anesthesia Physical Anesthesia Plan  ASA: III  Anesthesia Plan: General   Post-op Pain Management:    Induction: Intravenous  Airway Management Planned: Oral ETT  Additional Equipment:   Intra-op Plan:   Post-operative Plan: Extubation in OR  Informed Consent: I have reviewed the patients History and Physical, chart, labs and discussed the procedure including the risks, benefits and alternatives for the proposed anesthesia with the patient or authorized representative who has indicated his/her understanding and acceptance.   Dental advisory given  Plan Discussed with: CRNA and Anesthesiologist  Anesthesia Plan Comments:         Anesthesia Quick Evaluation

## 2015-02-17 NOTE — Discharge Instructions (Signed)
[facility] - DISCHARGE INSTRUCTIONS TO PATIENT  Activity:  Driving - May drivein one or two days, if doing well   Lifting - Take it easy for about a week, then no limit  Wound Care:  Leave the wound dry for 48 hours, then may shower.  Diet:  As tolerated  Follow up appointment:  Call Dr. Pollie Friar office [facility]) at [phone] for an appointment in 2 to 3 weeks.  Medications and dosages:  Resume your home medications.  You have a prescription for:  Vicodin  Call Dr. Lucia Gaskins or his office  [phone]) if you have:  Redness, tenderness, or signs of infection (pain, swelling, redness, odor or green/yellow discharge around the site),  Any other questions or concerns you may have after discharge.  In an emergency, call 911 or go to an Emergency Department at a nearby hospital.

## 2015-02-17 NOTE — Interval H&P Note (Signed)
History and Physical Interval Note:  02/17/2015 8:43 AM  Danielle Oliver  has presented today for surgery, with the diagnosis of right breast cancer  The various methods of treatment have been discussed with the patient and family.  Her mother is coming later.  She had her seed placed yesterday at [facility].  After consideration of risks, benefits and other options for treatment, the patient has consented to  Procedure(s): RIGHT BREAST LUMPECTOMY WITH RADIOACTIVE SEED LOCALIZATION (Right) as a surgical intervention .  The patient's history has been reviewed, patient examined, no change in status, stable for surgery.  I have reviewed the patient's chart and labs.  Questions were answered to the patient's satisfaction.     [name]

## 2015-02-17 NOTE — Anesthesia Procedure Notes (Signed)
Procedure Name: Intubation Date/Time: 02/17/2015 9:38 AM Performed by: Terrill Mohr Pre-anesthesia Checklist: Patient identified, Emergency Drugs available, Suction available and Patient being monitored Patient Re-evaluated:Patient Re-evaluated prior to inductionOxygen Delivery Method: Circle system utilized Preoxygenation: Pre-oxygenation with 100% oxygen Intubation Type: IV induction Ventilation: Mask ventilation without difficulty Laryngoscope Size: Mac and 3 Grade View: Grade II Tube type: Oral Tube size: 7.5 mm Number of attempts: 1 Airway Equipment and Method: Stylet Placement Confirmation: ETT inserted through vocal cords under direct vision,  breath sounds checked- equal and bilateral and positive ETCO2 Secured at: 22 (cm at teeth) cm Tube secured with: Tape Dental Injury: Teeth and Oropharynx as per pre-operative assessment

## 2015-02-17 NOTE — Op Note (Signed)
02/17/2015  10:27 AM  PATIENT:  Danielle Oliver DOB: 09-Apr-1960 MRN: [id number]  PREOP DIAGNOSIS:  right breast cancer  POSTOP DIAGNOSIS:   Right breast cancer, 10 o'clock position (T2)  PROCEDURE:   Procedure(s): RIGHT BREAST LUMPECTOMY WITH RADIOACTIVE SEED LOCALIZATION  SURGEON:   Alphonsa Overall, M.D.  ANESTHESIA:   general  Anesthesiologist: Oleta Mouse, MD CRNA: Terrill Mohr, CRNA  General  EBL:  minimal  ml  DRAINS: none   LOCAL MEDICATIONS USED:   30 cc 1/4% marcaine   SPECIMEN:   Right breast biopsy  COUNTS CORRECT:  YES  INDICATIONS FOR PROCEDURE:  Danielle Oliver is a 54 y.o. (DOB: January 12, 1961) AA  female whose primary care physician is Helane Rima, MD and comes for right breast lumpectomy.   She has been shown to have bone mets at least since a CT scan on 03/18/2014.  She has no visceral involvement.  She has seen some shrinkage of her primary breast cancer, so she now comes for excision of her primary breast cancer.   The options for breast cancer treatment have been discussed with the patient. She elected to proceed with lumpectomy and axillary sentinel lymph node.     The indications and potential complications of surgery were explained to the patient. Potential complications include, but are not limited to, bleeding, infection, the need for further surgery, and nerve injury.     She had a I131 seed placed on 02/16/2015 in her right breast at [facility].  I confirmed the presence of the I131 seed in the pre op area using the Neoprobe.  The seed is in the 10 o'clock position of the right breast.     OPERATIVE NOTE:   The patient was taken to room # 2 at [facility] where she underwent a general anesthesia  supervised by Anesthesiologist: Oleta Mouse, MD CRNA: Terrill Mohr, CRNA. Her right breast and axilla were prepped with  ChloraPrep and sterilely draped.    A time-out and the surgical check list was reviewed.    I turned attention to the cancer  which was about at the 10 o'clock position of the right breast.   I used the Neoprobe to identify the I131 seed.  I tried to excise an area around the tumor of at least 1 cm.    I excised this block of breast tissue approximately 5 cm by 6 cm  in diameter.  I had placed a suture in the medial aspect of the breast.   I painted the lumpectomy specimen with the 6 color paint kit and did a specimen mammogram which confirmed the mass, clip, and the seed were all in the right position in the specimen.  The specimen was sent to pathology who called back to confirm that they have the seed and the specimen.   I then irrigated the wound with saline. I infiltrated approximately 30 mL of 1% local between the incisions.  I placed 6 clips to mark biopsy cavity, at 12, 3, 6, and 9 o'clock.   I then closed all the wounds in layers using 3-0 Vicryl sutures for the deep layer. At the skin, I closed the incisions with a 4-0 Monocryl suture. The incisions were then painted with LiquiBand.  She had gauze place over the wounds and placed in a breast binder.   The patient tolerated the procedure well, was transported to the recovery room in good condition. Sponge and needle count were correct at the end of the case.  Final pathology is pending.   Alphonsa Overall, MD, [facility] Surgery Pager: [phone] Office phone:  [phone]

## 2015-02-17 NOTE — Transfer of Care (Signed)
Immediate Anesthesia Transfer of Care Note  Patient: Danielle Oliver  Procedure(s) Performed: Procedure(s): RIGHT BREAST LUMPECTOMY WITH RADIOACTIVE SEED LOCALIZATION (Right)  Patient Location: PACU  Anesthesia Type:General  Level of Consciousness: awake, alert , oriented and patient cooperative  Airway & Oxygen Therapy: Patient Spontanous Breathing and Patient connected to nasal cannula oxygen  Post-op Assessment: Report given to RN, Post -op Vital signs reviewed and stable and Patient moving all extremities  Post vital signs: Reviewed and stable  Last Vitals:  Filed Vitals:   02/17/15 0729 02/17/15 1046  BP: 151/74 122/81  Pulse: 96 88  Temp: 36.4 C 36.3 C  Resp: 16 14    Complications: No apparent anesthesia complications

## 2015-02-18 NOTE — Anesthesia Postprocedure Evaluation (Signed)
Anesthesia Post Note  Patient: PAYSEN HAMILTON  Procedure(s) Performed: Procedure(s) (LRB): RIGHT BREAST LUMPECTOMY WITH RADIOACTIVE SEED LOCALIZATION (Right)  Patient location during evaluation: PACU Anesthesia Type: General Level of consciousness: awake Pain management: pain level controlled Vital Signs Assessment: post-procedure vital signs reviewed and stable Respiratory status: spontaneous breathing Cardiovascular status: stable Postop Assessment: no signs of nausea or vomiting Anesthetic complications: no    Last Vitals:  Filed Vitals:   02/17/15 1155 02/17/15 1212  BP:  136/62  Pulse:  87  Temp: 36.7 C   Resp:      Last Pain:  Filed Vitals:   02/17/15 1213  PainSc: 3                  Wasif Simonich

## 2015-02-20 ENCOUNTER — Encounter (HOSPITAL_COMMUNITY): Payer: Self-pay | Admitting: Surgery

## 2015-02-22 NOTE — Progress Notes (Signed)
Patient ID: Danielle Oliver, female   DOB: 02/13/1961, 54 y.o.   MRN: [id number] 13 hour prednisone/benadryl prep called in to pt's pharmacy [facility]) for upcoming CT scan on 12/23 secondary to iodine allergy.

## 2015-02-23 ENCOUNTER — Encounter: Payer: Self-pay | Admitting: Oncology

## 2015-02-23 NOTE — Progress Notes (Signed)
Faxed claim for to [employer] [phone], called pt for pickup forms are in the lobby and forwarded to medical records.

## 2015-02-24 ENCOUNTER — Encounter (HOSPITAL_COMMUNITY): Payer: Self-pay

## 2015-02-24 ENCOUNTER — Ambulatory Visit (HOSPITAL_COMMUNITY)
Admission: RE | Admit: 2015-02-24 | Discharge: 2015-02-24 | Disposition: A | Discharge status: Home / Self Care | Payer: BC Managed Care – PPO | Source: Ambulatory Visit | Attending: Oncology | Admitting: Oncology

## 2015-02-24 DIAGNOSIS — C50411 Malignant neoplasm of upper-outer quadrant of right female breast: Secondary | ICD-10-CM | POA: Insufficient documentation

## 2015-02-24 DIAGNOSIS — C7951 Secondary malignant neoplasm of bone: Secondary | ICD-10-CM | POA: Diagnosis present

## 2015-02-24 DIAGNOSIS — C50911 Malignant neoplasm of unspecified site of right female breast: Secondary | ICD-10-CM

## 2015-02-24 MED ORDER — IOHEXOL 300 MG/ML  SOLN
150.0000 mL | Freq: Once | INTRAMUSCULAR | Status: AC | PRN
  Administered 2015-02-24: 100 mL via INTRAVENOUS

## 2015-03-09 ENCOUNTER — Other Ambulatory Visit (HOSPITAL_BASED_OUTPATIENT_CLINIC_OR_DEPARTMENT_OTHER): Payer: BC Managed Care – PPO

## 2015-03-09 ENCOUNTER — Ambulatory Visit (HOSPITAL_BASED_OUTPATIENT_CLINIC_OR_DEPARTMENT_OTHER): Payer: BC Managed Care – PPO | Admitting: Oncology

## 2015-03-09 ENCOUNTER — Telehealth: Payer: Self-pay | Admitting: Oncology

## 2015-03-09 ENCOUNTER — Ambulatory Visit (HOSPITAL_BASED_OUTPATIENT_CLINIC_OR_DEPARTMENT_OTHER): Payer: BC Managed Care – PPO

## 2015-03-09 VITALS — BP 134/78 | HR 101 | Temp 97.6°F | Resp 18 | Ht 67.0 in | Wt 358.7 lb

## 2015-03-09 DIAGNOSIS — C50411 Malignant neoplasm of upper-outer quadrant of right female breast: Secondary | ICD-10-CM | POA: Diagnosis not present

## 2015-03-09 DIAGNOSIS — C50919 Malignant neoplasm of unspecified site of unspecified female breast: Secondary | ICD-10-CM

## 2015-03-09 DIAGNOSIS — E559 Vitamin D deficiency, unspecified: Secondary | ICD-10-CM | POA: Diagnosis not present

## 2015-03-09 DIAGNOSIS — C7951 Secondary malignant neoplasm of bone: Secondary | ICD-10-CM | POA: Diagnosis not present

## 2015-03-09 DIAGNOSIS — C773 Secondary and unspecified malignant neoplasm of axilla and upper limb lymph nodes: Secondary | ICD-10-CM

## 2015-03-09 DIAGNOSIS — Z170 Estrogen receptor positive status [ER+]: Secondary | ICD-10-CM

## 2015-03-09 LAB — CBC WITH DIFFERENTIAL/PLATELET
BASO%: 2.2 % — ABNORMAL HIGH (ref 0.0–2.0)
BASOS ABS: 0.1 10*3/uL (ref 0.0–0.1)
EOS%: 4.1 % (ref 0.0–7.0)
Eosinophils Absolute: 0.2 10*3/uL (ref 0.0–0.5)
HEMATOCRIT: 38 % (ref 34.8–46.6)
HGB: 12.6 g/dL (ref 11.6–15.9)
LYMPH#: 1.3 10*3/uL (ref 0.9–3.3)
LYMPH%: 24.6 % (ref 14.0–49.7)
MCH: 31.7 pg (ref 25.1–34.0)
MCHC: 33.2 g/dL (ref 31.5–36.0)
MCV: 95.4 fL (ref 79.5–101.0)
MONO#: 0.3 10*3/uL (ref 0.1–0.9)
MONO%: 6 % (ref 0.0–14.0)
NEUT#: 3.3 10*3/uL (ref 1.5–6.5)
NEUT%: 63.1 % (ref 38.4–76.8)
PLATELETS: 327 10*3/uL (ref 145–400)
RBC: 3.98 10*6/uL (ref 3.70–5.45)
RDW: 17.7 % — ABNORMAL HIGH (ref 11.2–14.5)
WBC: 5.2 10*3/uL (ref 3.9–10.3)

## 2015-03-09 LAB — COMPREHENSIVE METABOLIC PANEL
ALBUMIN: 3.5 g/dL (ref 3.5–5.0)
ALT: 19 U/L (ref 0–55)
AST: 15 U/L (ref 5–34)
Alkaline Phosphatase: 74 U/L (ref 40–150)
Anion Gap: 8 mEq/L (ref 3–11)
BUN: 17.4 mg/dL (ref 7.0–26.0)
CALCIUM: 8.8 mg/dL (ref 8.4–10.4)
CHLORIDE: 109 meq/L (ref 98–109)
CO2: 26 meq/L (ref 22–29)
Creatinine: 1.1 mg/dL (ref 0.6–1.1)
EGFR: 66 mL/min/{1.73_m2} — ABNORMAL LOW (ref >90)
Glucose: 105 mg/dl (ref 70–140)
POTASSIUM: 3.9 meq/L (ref 3.5–5.1)
Sodium: 143 mEq/L (ref 136–145)
Total Bilirubin: 1.44 mg/dL — ABNORMAL HIGH (ref 0.20–1.20)
Total Protein: 7.1 g/dL (ref 6.4–8.3)

## 2015-03-09 LAB — CANCER ANTIGEN 27.29: CA 27.29: 86 U/mL — AB (ref 0–39)

## 2015-03-09 MED ORDER — DENOSUMAB 120 MG/1.7ML ~~LOC~~ SOLN
120.0000 mg | Freq: Once | SUBCUTANEOUS | Status: DC
  Filled 2015-03-09: qty 1.7

## 2015-03-09 MED ORDER — DENOSUMAB 120 MG/1.7ML ~~LOC~~ SOLN
120.0000 mg | Freq: Once | SUBCUTANEOUS | Status: AC
  Administered 2015-03-09: 120 mg via SUBCUTANEOUS
  Filled 2015-03-09: qty 1.7

## 2015-03-09 NOTE — Telephone Encounter (Signed)
Appointments made and avs printed for patient °

## 2015-03-09 NOTE — Progress Notes (Signed)
Visit  [facility]  [phone] [phone]     ID: DONEEN OLLINGER DOB: 27-Aug-1960  MR#: [id number]  HQI#:[id number]  Patient Care Team: Helane Rima, MD as PCP - General (Family Medicine) Thea Silversmith, MD as Consulting Physician (Radiation Oncology) Holley Bouche, NP as Nurse Practitioner (Nurse Practitioner) Chauncey Cruel, MD as Consulting Physician (Oncology) Alphonsa Overall, MD as Consulting Physician (General Surgery) PCP: Helane Rima, MD GYN: OTHER MD: [name] MD  CHIEF COMPLAINT: Stage IV breast cancer  CURRENT TREATMENT: Anastrozole, palbociclib, denosumab   BREAST CANCER HISTORY: From Dr Krista [facility] 03/11/2014 intake note:  "She was diagnosed with stage III left breast cancer in 1991 when she was 55 years old. Unfortunately her breast cancer diagnosis and treatment history was not available for review today. Apparently she had left mastectomy, and 4 cycles of adjuvant chemotherapy under Dr. Mariana Kaufman care at our cancer center. Patient does not recall the name of the chemotherapy medication. She was also offered a clinical trial of bone marrow transplant for her breast cancer and she declined. She was followed by January mammograms and had no evidence of recurrence. Her last screening mammogram was in 2009 which was benign.  She noticed a lump in her right breast about 20 weeks ago. It was hard and the tender on palpitation. She denies any skin change or nipple discharge. She has no appetite change, weight loss, or other new symptoms. She had underwent a diagnostic mammogram on 01/12/2014, which showed a 3.6 cm lobulated solid mass in right upper quadrant of right breast, and an oval axillary lymph nodes was cortical thickening. 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 Her right axillary lymph node biopsy also showed  invasive adenocarcinoma."  Her subsequent history is as detailed below  INTERVAL HISTORY: Ramata returns today for follow-up of her stage IV breast cancer. Since her last visit here she underwent right lumpectomy with no sentinel lymph node sampling, on 02/17/2015. The final pathology ([facility] [id number]) showed invasive ductal carcinoma, grade 1, measuring 3.0 cm, with negative margins. Estrogen receptor was 100% positive, progesterone receptor 20% positive, both with strong staining intensity. HER-2 was not amplified, with a signals ratio of 1.26, the number per cell 1.95.  She is currently day 11 cycle 4 of Palbociclib, which she is tolerating well. She continues on anastrozole, with some stiffness, and occasional hot flashes, but no problems with vaginal dryness. She obtains both drugs at a very reasonable cost. Of course she is also on monthly Denosumab. She has no side effects from that shot that she is aware of.  A CT scan of the chest obtained in December showed no involvement of the lungs or liver. There were no new bone lesions.   REVIEW OF SYSTEMS: Meryl had a bout of gastroenteritis last week. Her of children also had it. This is now resolving. She has a little bit of a sore throat. Aside from these issues a detailed review of systems today was entirely benign.  PAST MEDICAL HISTORY: Past Medical History  Diagnosis Date  . Breast cancer ([place])   . Hypertension   . Gout   . Arthritis   . Morbid obesity with body mass index of 50.0-59.9 in adult [facility])   . Dysrhythmia   . Heart murmur     one Dr told her, "nothing to worry about"  . Shortness of breath dyspnea  With exertion  . Pneumonia 2013 ish    PAST SURGICAL HISTORY: Past Surgical History  Procedure Laterality Date  . Mastectomy Left 1992  . Tonsillectomy    . Fracture surgery Right   . Colonoscopy    . Breast lumpectomy with radioactive seed localization Right 02/17/2015    Procedure: RIGHT BREAST LUMPECTOMY WITH  RADIOACTIVE SEED LOCALIZATION;  Surgeon: Alphonsa Overall, MD;  Location: [facility];  Service: General;  Laterality: Right;    FAMILY HISTORY Family History  Problem Relation Age of Onset  . Colon cancer Father    the patient's father died at 54 from colon cancer which was diagnosed shortly before his death. The patient's mother is living, age 79 as of July 2016. The patient is an only child. The only other cancer in the family to her knowledge is a paternal great aunt who developed breast cancer in her late 74s. There is no history of ovarian cancer and no other history of colon cancer in the family to her knowledge  GYNECOLOGIC HISTORY:  No LMP recorded. Patient is postmenopausal. menarche age 70 first live birth age 56 the patient is [place] P3. She stopped having periods in 2013. She did not take hormone replacement.   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 She is divorced. At home is just she and her son [name], 80 years old. Son [name], 27, lives in [name] where he works as a Fish farm manager. Son [name], 25, lives in [place] and is working on a Oceanographer in counseling at [employer]. The patient has one granddaughter, [name], in [name]. She is not a church attender    ADVANCED DIRECTIVES: Not in place   HEALTH MAINTENANCE: Social History  Substance Use Topics  . Smoking status: Never Smoker   . Smokeless tobacco: Not on file  . Alcohol Use: No     Comment: rare     Colonoscopy: remote/ [name]  PAP: 2014  Bone density:  Lipid panel:  Allergies  Allergen Reactions  . Iodinated Diagnostic Agents Anaphylaxis  . Penicillins Rash    Has patient had a PCN reaction causing immediate rash, facial/tongue/throat swelling, SOB or lightheadedness with hypotension: Yes  Has patient had a PCN reaction causing severe rash involving mucus membranes or skin necrosis: No Has patient had a PCN reaction that required hospitalization No Has patient had a PCN  reaction occurring within the last 10 years: Yes If all of the above answers are "NO", then may proceed with Cephalosporin use.    Current Outpatient Prescriptions  Medication Sig Dispense Refill  . amLODipine-olmesartan (AZOR) 10-40 MG tablet Take 1 tablet by mouth daily.    Marland Kitchen anastrozole (ARIMIDEX) 1 MG tablet Take 1 tablet by mouth daily.    . Calcium Carb-Cholecalciferol (CALTRATE 600+D) 600-800 MG-UNIT TABS Take by mouth 2 (two) times daily. 60 tablet   . denosumab (PROLIA) 60 MG/ML SOLN injection Inject 120 mg into the skin every 30 (thirty) days. Administer in upper arm, thigh, or abdomen    . HYDROcodone-acetaminophen (NORCO/VICODIN) 5-325 MG tablet Take 1-2 tablets by mouth every 6 (six) hours as needed. 30 tablet 0  . ibuprofen (ADVIL,MOTRIN) 200 MG tablet Take 200-400 mg by mouth daily as needed. For breast pain    . lovastatin (MEVACOR) 40 MG tablet Take 40 mg by mouth daily.    . palbociclib (IBRANCE) 100 MG capsule Take 1 capsule (100 mg total) by mouth daily with breakfast. Take whole with food for 21 days,  rest 7 days and repeat. 21 capsule 6   No current facility-administered medications for this visit.    OBJECTIVE: Middle-aged African-American woman in no acute distress Filed Vitals:   03/09/15 0837  BP: 134/78  Pulse: 101  Temp: 97.6 F (36.4 C)  Resp: 18     Body mass index is 56.17 kg/(m^2).    ECOG FS:1 - Symptomatic but completely ambulatory  Sclerae unicteric, EOMs intact Oropharynx clear, dentition in good repair No cervical or supraclavicular adenopathy Lungs no rales or rhonchi Heart regular rate and rhythm Abd soft, nontender, positive bowel sounds MSK no focal spinal tenderness, no upper extremity lymphedema Neuro: nonfocal, well oriented, appropriate affect Breasts: The right breast is status post lumpectomy. The incision is healing nicely, with no dehiscence, erythema, or swelling. There is a subjacent mass suggestive of a seroma. This is nontender.  The right axilla is benign. The left breast is status post mastectomy. There is no chest wall finding of concern. The left axilla is benign.   LAB RESULTS:  CMP     Component Value Date/Time   NA 143 02/16/2015 1152   NA 142 02/09/2015 0813   K 4.1 02/16/2015 1152   K 4.2 02/09/2015 0813   CL 110 02/16/2015 1152   CO2 26 02/16/2015 1152   CO2 25 02/09/2015 0813   GLUCOSE 112* 02/16/2015 1152   GLUCOSE 99 02/09/2015 0813   BUN 11 02/16/2015 1152   BUN 15.6 02/09/2015 0813   CREATININE 0.81 02/16/2015 1152   CREATININE 1.0 02/09/2015 0813   CALCIUM 8.9 02/16/2015 1152   CALCIUM 9.7 02/09/2015 0813   PROT 7.6 02/09/2015 0813   ALBUMIN 3.8 02/09/2015 0813   AST 14 02/09/2015 0813   ALT 12 02/09/2015 0813   ALKPHOS 69 02/09/2015 0813   BILITOT 0.93 02/09/2015 0813   GFRNONAA >60 02/16/2015 1152   GFRAA >60 02/16/2015 1152   Results for ESMERALDA, MALAY (MRN [id number]) as of 03/09/2015 08:40  Ref. Range 11/08/2014 15:58 12/06/2014 16:06 12/27/2014 16:07 01/12/2015 15:51 02/09/2015 08:13  CA 27.29 Latest Ref Range: 0-39 U/mL 132 (H) 101 (H) 95 (H) 103 (H) 94 (H)   INo results found for: SPEP, UPEP  Lab Results  Component Value Date   WBC 5.2 03/09/2015   NEUTROABS 3.3 03/09/2015   HGB 12.6 03/09/2015   HCT 38.0 03/09/2015   MCV 95.4 03/09/2015   PLT 327 03/09/2015      Chemistry      Component Value Date/Time   NA 143 02/16/2015 1152   NA 142 02/09/2015 0813   K 4.1 02/16/2015 1152   K 4.2 02/09/2015 0813   CL 110 02/16/2015 1152   CO2 26 02/16/2015 1152   CO2 25 02/09/2015 0813   BUN 11 02/16/2015 1152   BUN 15.6 02/09/2015 0813   CREATININE 0.81 02/16/2015 1152   CREATININE 1.0 02/09/2015 0813      Component Value Date/Time   CALCIUM 8.9 02/16/2015 1152   CALCIUM 9.7 02/09/2015 0813   ALKPHOS 69 02/09/2015 0813   AST 14 02/09/2015 0813   ALT 12 02/09/2015 0813   BILITOT 0.93 02/09/2015 0813       Lab Results  Component Value Date   LABCA2 94* 02/09/2015     No components found for: VOPFY924  No results for input(s): INR in the last 168 hours.  Urinalysis    Component Value Date/Time   LABSPEC 1.015 04/27/2007 1654   PHURINE 6.0 04/27/2007 1654   GLUCOSEU NEGATIVE 04/27/2007 1654  HGBUR NEGATIVE 04/27/2007 1654   BILIRUBINUR NEGATIVE 04/27/2007 1654   KETONESUR NEGATIVE 04/27/2007 1654   PROTEINUR NEGATIVE 04/27/2007 1654   UROBILINOGEN 0.2 04/27/2007 1654   NITRITE NEGATIVE 04/27/2007 1654   LEUKOCYTESUR  04/27/2007 1654    NEGATIVE Biochemical Testing Only. Please order routine urinalysis from main lab if confirmatory testing is needed.   STUDIES: Ct Chest W Contrast  02/24/2015  CLINICAL DATA:  Breast cancer. EXAM: CT CHEST WITH CONTRAST TECHNIQUE: Multidetector CT imaging of the chest was performed during intravenous contrast administration. CONTRAST:  129m OMNIPAQUE IOHEXOL 300 MG/ML  SOLN COMPARISON:  10/20/2014 an 03/18/2014. FINDINGS: Mediastinum: Normal heart size. No pericardial effusion. The trachea is patent and midline. Normal appearance of the esophagus. No supraclavicular or axillary adenopathy. No mediastinal or hilar adenopathy. Lungs/Pleura: No pleural effusions identified. No airspace consolidation or atelectasis. No suspicious pulmonary nodule or mass. Upper Abdomen: The visualized portions of the liver are normal. Gallstone is identified measuring 1.3 cm. The spleen is normal. Unremarkable appearance of the adrenal glands. The visualized portions of the kidneys and pancreas are unremarkable. Musculoskeletal: Multifocal sclerotic bone metastases are again identified. Compared with previous exam there is been increase in size and number of abnormal areas of sclerosis associated with the axial and visualized proximal appendicular skeleton. Seroma within the lateral right breast measures 4.1 x 6.5 cm, image 14 of series 2. Status post left mastectomy. IMPRESSION: 1. Interval increase in sclerosis involving the axial and  visualized proximal appendicular skeleton. Findings may represent interval healing of bone metastasis. This would be difficult to distinguish from progression of sclerotic bone metastasis and clinical correlation is recommended. 2. No evidence for soft tissue metastasis within the chest or visualized portions of the upper abdomen. Electronically Signed   By: TKerby MoorsM.D.   On: 02/24/2015 15:15    ASSESSMENT: 55y.o. [place] woman  (1) status post left mastectomy in 1991 followed by adjuvant chemotherapy 4, no further details available  (2) status post right breast biopsy 01/13/2014 for a  cT2 p N1, stage IIB invasive ductal carcinoma, estrogen and progesterone receptor positive, HER-2 negative, with an MIB-1 of 10%.  METASTATIC DISEASE: CT scan 03/18/2014 shows multiple sclerotic bone mets but no visceral disease  (1) no bone biopsy obtained to this point  (b) CA 27-29 is informative  (3) anastrozole started January 2016  (a)  palbociclib added 11/21/2014 at 125 mg daily, 21/7  (b) Palbociclib dose decreased to 100 mg daily, 21/ 7, with cycle 2, starting 12/21/2014  (4) vitamin D deficiency: On replacement  (5) status post right lumpectomy 02/17/2015 for a pT2 pNX invasive ductal carcinoma, grade 1, estrogen receptor 100% positive, progesterone receptor 20% positive, HER-2 not amplified. Margins were negative  (6) denosumab/ Xgeva started 12/15/2014, repeated monthly  (7) genetics testing pending  PLAN: AParaskevidid well with her surgery, and we are not going to proceed to postoperative radiation, but will follow closely with repeat right mammography in May and then of course bilateral mammography November of this year.  At this point we are starting routine treatment. I would like to coordinate her Xgeva doses with the beginning of her Palbociclib cycle so she will not receive Xgeva and month from today. Instead she will receive it 04/24/2015, which will be the first day of her  fifth cycle of Palbociclib. Thereafter we will see her on a monthly basis, the same day that she receives lab work, XIT consultant and start her Palbociclib.  We will obtain a repeat  bone scan in May.  We will offer her genetic testing of when she is ready to reconsider that.  Carman has a good understanding of the overall situation. The plan is to continue the current treatment indefinitely until there is evidence of disease progression. She knows to call for any problems that may develop before her next visit here. Chauncey Cruel, MD   03/09/2015 8:40 AM Medical Oncology and Hematology [facility] [address] [place], [place] [address] Tel. [phone]    Fax. [phone]

## 2015-03-27 ENCOUNTER — Ambulatory Visit (HOSPITAL_BASED_OUTPATIENT_CLINIC_OR_DEPARTMENT_OTHER): Payer: BC Managed Care – PPO | Admitting: Nurse Practitioner

## 2015-03-27 ENCOUNTER — Other Ambulatory Visit (HOSPITAL_BASED_OUTPATIENT_CLINIC_OR_DEPARTMENT_OTHER): Payer: BC Managed Care – PPO

## 2015-03-27 ENCOUNTER — Encounter: Payer: Self-pay | Admitting: Nurse Practitioner

## 2015-03-27 ENCOUNTER — Ambulatory Visit: Payer: BC Managed Care – PPO | Admitting: Nurse Practitioner

## 2015-03-27 VITALS — BP 160/78 | HR 90 | Temp 98.0°F | Resp 19 | Ht 67.0 in | Wt 364.9 lb

## 2015-03-27 DIAGNOSIS — C773 Secondary and unspecified malignant neoplasm of axilla and upper limb lymph nodes: Secondary | ICD-10-CM

## 2015-03-27 DIAGNOSIS — E559 Vitamin D deficiency, unspecified: Secondary | ICD-10-CM

## 2015-03-27 DIAGNOSIS — Z170 Estrogen receptor positive status [ER+]: Secondary | ICD-10-CM

## 2015-03-27 DIAGNOSIS — C7951 Secondary malignant neoplasm of bone: Secondary | ICD-10-CM | POA: Diagnosis not present

## 2015-03-27 DIAGNOSIS — C50411 Malignant neoplasm of upper-outer quadrant of right female breast: Secondary | ICD-10-CM | POA: Diagnosis not present

## 2015-03-27 DIAGNOSIS — R232 Flushing: Secondary | ICD-10-CM

## 2015-03-27 DIAGNOSIS — Z853 Personal history of malignant neoplasm of breast: Secondary | ICD-10-CM

## 2015-03-27 LAB — CBC WITH DIFFERENTIAL/PLATELET
BASO%: 1.5 % (ref 0.0–2.0)
BASOS ABS: 0.1 10*3/uL (ref 0.0–0.1)
EOS ABS: 0.1 10*3/uL (ref 0.0–0.5)
EOS%: 1.8 % (ref 0.0–7.0)
HEMATOCRIT: 38.8 % (ref 34.8–46.6)
HGB: 12.8 g/dL (ref 11.6–15.9)
LYMPH#: 1.8 10*3/uL (ref 0.9–3.3)
LYMPH%: 31.6 % (ref 14.0–49.7)
MCH: 32.3 pg (ref 25.1–34.0)
MCHC: 33.1 g/dL (ref 31.5–36.0)
MCV: 97.6 fL (ref 79.5–101.0)
MONO#: 0.7 10*3/uL (ref 0.1–0.9)
MONO%: 12.1 % (ref 0.0–14.0)
NEUT#: 3 10*3/uL (ref 1.5–6.5)
NEUT%: 53 % (ref 38.4–76.8)
Platelets: 274 10*3/uL (ref 145–400)
RBC: 3.97 10*6/uL (ref 3.70–5.45)
RDW: 16.7 % — ABNORMAL HIGH (ref 11.2–14.5)
WBC: 5.7 10*3/uL (ref 3.9–10.3)

## 2015-03-27 NOTE — Progress Notes (Signed)
Visit  [facility]  [phone] [phone]) [phone]     ID: [name] DOB: Nov 02, 1960  MR#: [id number]  NTI#:[id number]  Patient Care Team: Helane Rima, MD as PCP - General (Family Medicine) Thea Silversmith, MD as Consulting Physician (Radiation Oncology) Holley Bouche, NP as Nurse Practitioner (Nurse Practitioner) Chauncey Cruel, MD as Consulting Physician (Oncology) Alphonsa Overall, MD as Consulting Physician (General Surgery) PCP: Helane Rima, MD GYN: OTHER MD: [name] MD  CHIEF COMPLAINT: Stage IV breast cancer  CURRENT TREATMENT: Anastrozole, palbociclib, denosumab   BREAST CANCER HISTORY: From Dr Krista [facility] 03/11/2014 intake note:  "She was diagnosed with stage III left breast cancer in 1991 when she was 55 years old. Unfortunately her breast cancer diagnosis and treatment history was not available for review today. Apparently she had left mastectomy, and 4 cycles of adjuvant chemotherapy under Dr. Mariana Kaufman care at our cancer center. Patient does not recall the name of the chemotherapy medication. She was also offered a clinical trial of bone marrow transplant for her breast cancer and she declined. She was followed by January mammograms and had no evidence of recurrence. Her last screening mammogram was in 2009 which was benign.  She noticed a lump in her right breast about 20 weeks ago. It was hard and the tender on palpitation. She denies any skin change or nipple discharge. She has no appetite change, weight loss, or other new symptoms. She had underwent a diagnostic mammogram on 01/12/2014, which showed a 3.6 cm lobulated solid mass in right upper quadrant of right breast, and an oval axillary lymph nodes was cortical thickening. 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 Her right axillary lymph node biopsy also showed  invasive adenocarcinoma."  Her subsequent history is as detailed below  INTERVAL HISTORY: Danielle Oliver returns today for follow-up of her stage IV breast cancer. Today is day 1, cycle 5 of palbociclib, she tolerates this well with some fatigue, but she does not notice this until her off week. She also continues on anastrozole daily. She has hot flashes nightly but decided she did not want to take any prescriptive medicines for this. She denies vaginal dryness. She has trigger finger to her right middle finger, but this predates the start of antiestrogen therapy. She has knee pain which was also present at [facility].   REVIEW OF SYSTEMS: Danielle Oliver denies fevers, chills, nausea, vomiting, or changes in bowel or bladder habits. She is short of breath with exertion and "moves slowly", but denies chest pain, cough, or palpitations. Her appetite is fair. Foods taste too salty lately. A detailed review of systems is otherwise stable.   PAST MEDICAL HISTORY: Past Medical History  Diagnosis Date  . Breast cancer ([place])   . Hypertension   . Gout   . Arthritis   . Morbid obesity with body mass index of 50.0-59.9 in adult [facility])   . Dysrhythmia   . Heart murmur     one Dr told her, "nothing to worry about"  . Shortness of breath dyspnea     With exertion  . Pneumonia 2013 ish    PAST SURGICAL HISTORY: Past Surgical History  Procedure Laterality Date  . Mastectomy Left 1992  . Tonsillectomy    . Fracture surgery Right   . Colonoscopy    . Breast lumpectomy with radioactive seed localization Right 02/17/2015    Procedure: RIGHT BREAST LUMPECTOMY WITH  RADIOACTIVE SEED LOCALIZATION;  Surgeon: Alphonsa Overall, MD;  Location: [facility];  Service: General;  Laterality: Right;    FAMILY HISTORY Family History  Problem Relation Age of Onset  . Colon cancer Father    the patient's father died at 51 from colon cancer which was diagnosed shortly before his death. The patient's mother is living, age 85 as of July 2016.  The patient is an only child. The only other cancer in the family to her knowledge is a paternal great aunt who developed breast cancer in her late 2s. There is no history of ovarian cancer and no other history of colon cancer in the family to her knowledge  GYNECOLOGIC HISTORY:  No LMP recorded. Patient is postmenopausal. menarche age 67 first live birth age 4 the patient is [place] P3. She stopped having periods in 2013. She did not take hormone replacement.   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 She is divorced. At home is just she and her son [name], 28 years old. Son [name], 27, lives in [name] where he works as a Fish farm manager. Son [name], 25, lives in [place] and is working on a Oceanographer in counseling at [employer]. The patient has one granddaughter, [name], in [name]. She is not a church attender    ADVANCED DIRECTIVES: Not in place   HEALTH MAINTENANCE: Social History  Substance Use Topics  . Smoking status: Never Smoker   . Smokeless tobacco: Not on file  . Alcohol Use: No     Comment: rare     Colonoscopy: remote/ [name]  PAP: 2014  Bone density:  Lipid panel:  Allergies  Allergen Reactions  . Iodinated Diagnostic Agents Anaphylaxis  . Penicillins Rash    Has patient had a PCN reaction causing immediate rash, facial/tongue/throat swelling, SOB or lightheadedness with hypotension: Yes  Has patient had a PCN reaction causing severe rash involving mucus membranes or skin necrosis: No Has patient had a PCN reaction that required hospitalization No Has patient had a PCN reaction occurring within the last 10 years: Yes If all of the above answers are "NO", then may proceed with Cephalosporin use.    Current Outpatient Prescriptions  Medication Sig Dispense Refill  . amLODipine-olmesartan (AZOR) 10-40 MG tablet Take 1 tablet by mouth daily.    Marland Kitchen anastrozole (ARIMIDEX) 1 MG tablet Take 1 tablet by mouth daily.    . Calcium  Carb-Cholecalciferol (CALTRATE 600+D) 600-800 MG-UNIT TABS Take by mouth 2 (two) times daily. 60 tablet   . ibuprofen (ADVIL,MOTRIN) 200 MG tablet Take 200-400 mg by mouth daily as needed. For breast pain    . lovastatin (MEVACOR) 40 MG tablet Take 40 mg by mouth daily.    . palbociclib (IBRANCE) 100 MG capsule Take 1 capsule (100 mg total) by mouth daily with breakfast. Take whole with food for 21 days, rest 7 days and repeat. 21 capsule 6   No current facility-administered medications for this visit.    OBJECTIVE: Middle-aged African-American woman in no acute distress Filed Vitals:   03/27/15 1516  BP: 160/78  Pulse: 90  Temp: 98 F (36.7 C)  Resp: 19     Body mass index is 57.14 kg/(m^2).    ECOG FS:1 - Symptomatic but completely ambulatory  Skin: warm, dry  HEENT: sclerae anicteric, conjunctivae pink, oropharynx clear. No thrush or mucositis.  Lymph Nodes: No cervical or supraclavicular lymphadenopathy  Lungs: clear to auscultation bilaterally, no rales, wheezes, or rhonci  Heart: regular  rate and rhythm  Abdomen: obese, soft, non tender, positive bowel sounds  Musculoskeletal: No focal spinal tenderness, no peripheral edema  Neuro: non focal, well oriented, positive affect  Breasts: deferred   LAB RESULTS: Results for KATINA, REMICK (MRN [id number]) as of 03/27/2015 16:17  Ref. Range 12/06/2014 16:06 12/27/2014 16:07 01/12/2015 15:51 02/09/2015 08:13 03/09/2015 08:20  CA 27.29 Latest Ref Range: 0-39 U/mL 101 (H) 95 (H) 103 (H) 94 (H) 86 (H)    CMP     Component Value Date/Time   NA 143 03/09/2015 0820   NA 143 02/16/2015 1152   K 3.9 03/09/2015 0820   K 4.1 02/16/2015 1152   CL 110 02/16/2015 1152   CO2 26 03/09/2015 0820   CO2 26 02/16/2015 1152   GLUCOSE 105 03/09/2015 0820   GLUCOSE 112* 02/16/2015 1152   BUN 17.4 03/09/2015 0820   BUN 11 02/16/2015 1152   CREATININE 1.1 03/09/2015 0820   CREATININE 0.81 02/16/2015 1152   CALCIUM 8.8 03/09/2015 0820   CALCIUM  8.9 02/16/2015 1152   PROT 7.1 03/09/2015 0820   ALBUMIN 3.5 03/09/2015 0820   AST 15 03/09/2015 0820   ALT 19 03/09/2015 0820   ALKPHOS 74 03/09/2015 0820   BILITOT 1.44* 03/09/2015 0820   GFRNONAA >60 02/16/2015 1152   GFRAA >60 02/16/2015 1152     INo results found for: SPEP, UPEP  Lab Results  Component Value Date   WBC 5.7 03/27/2015   NEUTROABS 3.0 03/27/2015   HGB 12.8 03/27/2015   HCT 38.8 03/27/2015   MCV 97.6 03/27/2015   PLT 274 03/27/2015      Chemistry      Component Value Date/Time   NA 143 03/09/2015 0820   NA 143 02/16/2015 1152   K 3.9 03/09/2015 0820   K 4.1 02/16/2015 1152   CL 110 02/16/2015 1152   CO2 26 03/09/2015 0820   CO2 26 02/16/2015 1152   BUN 17.4 03/09/2015 0820   BUN 11 02/16/2015 1152   CREATININE 1.1 03/09/2015 0820   CREATININE 0.81 02/16/2015 1152      Component Value Date/Time   CALCIUM 8.8 03/09/2015 0820   CALCIUM 8.9 02/16/2015 1152   ALKPHOS 74 03/09/2015 0820   AST 15 03/09/2015 0820   ALT 19 03/09/2015 0820   BILITOT 1.44* 03/09/2015 0820       Lab Results  Component Value Date   LABCA2 86* 03/09/2015    No components found for: [id number]  No results for input(s): INR in the last 168 hours.  Urinalysis    Component Value Date/Time   LABSPEC 1.015 04/27/2007 1654   PHURINE 6.0 04/27/2007 1654   GLUCOSEU NEGATIVE 04/27/2007 1654   HGBUR NEGATIVE 04/27/2007 1654   BILIRUBINUR NEGATIVE 04/27/2007 1654   KETONESUR NEGATIVE 04/27/2007 1654   PROTEINUR NEGATIVE 04/27/2007 1654   UROBILINOGEN 0.2 04/27/2007 1654   NITRITE NEGATIVE 04/27/2007 1654   LEUKOCYTESUR  04/27/2007 1654    NEGATIVE Biochemical Testing Only. Please order routine urinalysis from main lab if confirmatory testing is needed.   STUDIES: No results found.  ASSESSMENT: 55 y.o. Danielle Oliver woman  (1) status post left mastectomy in 1991 followed by adjuvant chemotherapy 4, no further details available  (2) status post right breast biopsy  01/13/2014 for a  cT2 p N1, stage IIB invasive ductal carcinoma, estrogen and progesterone receptor positive, HER-2 negative, with an MIB-1 of 10%.  METASTATIC DISEASE: CT scan 03/18/2014 shows multiple sclerotic bone mets but no visceral disease  (1)  no bone biopsy obtained to this point  (b) CA 27-29 is informative  (3) anastrozole started January 2016  (a)  palbociclib added 11/21/2014 at 125 mg daily, 21/7  (b) Palbociclib dose decreased to 100 mg daily, 21/ 7, with cycle 2, starting 12/21/2014  (4) vitamin D deficiency: On replacement  (5) status post right lumpectomy 02/17/2015 for a pT2 pNX invasive ductal carcinoma, grade 1, estrogen receptor 100% positive, progesterone receptor 20% positive, HER-2 not amplified. Margins were negative  (6) denosumab/ Xgeva started 12/15/2014, repeated monthly  (7) genetics testing pending  PLAN: Danielle Oliver is doing well today. She continues to tolerate treatment well with little complaint. The labs were reviewed in detail and her [place] is normal at 3.0. She is ok to start her next cycle of 181m palbociclib as planned. Her CA 27.29 has been trending downwards for the past 3-4 months. She will continue on anastrozole daily and manage her hot flashes on her own.  AOluwademiladewill return on 2/20 for follow up with Dr. MJana Hakimon the start of her next cycle. This is when she will resume xgeva. She understands and agrees with this plan. She knows the goal of treatment in her case is control. She has been encouraged to call with any issues that might arise before her next visit here.   HLaurie Panda NP   03/27/2015 4:12 PM

## 2015-04-06 ENCOUNTER — Other Ambulatory Visit: Payer: Self-pay | Admitting: Oncology

## 2015-04-07 ENCOUNTER — Other Ambulatory Visit: Payer: Self-pay | Admitting: Oncology

## 2015-04-07 DIAGNOSIS — C50411 Malignant neoplasm of upper-outer quadrant of right female breast: Secondary | ICD-10-CM

## 2015-04-10 ENCOUNTER — Telehealth: Payer: Self-pay | Admitting: Oncology

## 2015-04-10 NOTE — Telephone Encounter (Signed)
Patient already on schedule with Dr. Pablo Ledger for 2/15 - scheduled by radonc. No other orders per 2/3 pof.

## 2015-04-14 ENCOUNTER — Other Ambulatory Visit: Payer: Self-pay | Admitting: Oncology

## 2015-04-19 ENCOUNTER — Ambulatory Visit
Admission: RE | Admit: 2015-04-19 | Discharge: 2015-04-19 | Disposition: A | Discharge status: Home / Self Care | Payer: BC Managed Care – PPO | Source: Ambulatory Visit | Attending: Radiation Oncology | Admitting: Radiation Oncology

## 2015-04-19 ENCOUNTER — Ambulatory Visit: Admission: RE | Admit: 2015-04-19 | Payer: BC Managed Care – PPO | Source: Ambulatory Visit

## 2015-04-19 ENCOUNTER — Institutional Professional Consult (permissible substitution): Payer: Self-pay | Admitting: Radiation Oncology

## 2015-04-24 ENCOUNTER — Ambulatory Visit (HOSPITAL_BASED_OUTPATIENT_CLINIC_OR_DEPARTMENT_OTHER): Payer: BC Managed Care – PPO | Admitting: Nurse Practitioner

## 2015-04-24 ENCOUNTER — Other Ambulatory Visit (HOSPITAL_BASED_OUTPATIENT_CLINIC_OR_DEPARTMENT_OTHER): Payer: BC Managed Care – PPO

## 2015-04-24 ENCOUNTER — Ambulatory Visit: Payer: BC Managed Care – PPO

## 2015-04-24 ENCOUNTER — Encounter: Payer: Self-pay | Admitting: Nurse Practitioner

## 2015-04-24 ENCOUNTER — Telehealth: Payer: Self-pay | Admitting: Nurse Practitioner

## 2015-04-24 VITALS — BP 147/80 | HR 86 | Temp 98.0°F | Resp 18 | Ht 67.0 in | Wt 366.7 lb

## 2015-04-24 DIAGNOSIS — R5383 Other fatigue: Secondary | ICD-10-CM

## 2015-04-24 DIAGNOSIS — E559 Vitamin D deficiency, unspecified: Secondary | ICD-10-CM | POA: Diagnosis not present

## 2015-04-24 DIAGNOSIS — C773 Secondary and unspecified malignant neoplasm of axilla and upper limb lymph nodes: Secondary | ICD-10-CM | POA: Diagnosis not present

## 2015-04-24 DIAGNOSIS — C50411 Malignant neoplasm of upper-outer quadrant of right female breast: Secondary | ICD-10-CM

## 2015-04-24 DIAGNOSIS — M2550 Pain in unspecified joint: Secondary | ICD-10-CM

## 2015-04-24 DIAGNOSIS — N951 Menopausal and female climacteric states: Secondary | ICD-10-CM

## 2015-04-24 DIAGNOSIS — R232 Flushing: Secondary | ICD-10-CM

## 2015-04-24 DIAGNOSIS — M65331 Trigger finger, right middle finger: Secondary | ICD-10-CM

## 2015-04-24 DIAGNOSIS — Z170 Estrogen receptor positive status [ER+]: Secondary | ICD-10-CM

## 2015-04-24 DIAGNOSIS — C7951 Secondary malignant neoplasm of bone: Secondary | ICD-10-CM

## 2015-04-24 DIAGNOSIS — M6530 Trigger finger, unspecified finger: Secondary | ICD-10-CM

## 2015-04-24 LAB — CBC WITH DIFFERENTIAL/PLATELET
BASO%: 2.4 % — ABNORMAL HIGH (ref 0.0–2.0)
BASOS ABS: 0.2 10*3/uL — AB (ref 0.0–0.1)
EOS ABS: 0.2 10*3/uL (ref 0.0–0.5)
EOS%: 2.4 % (ref 0.0–7.0)
HEMATOCRIT: 40 % (ref 34.8–46.6)
HGB: 13.6 g/dL (ref 11.6–15.9)
LYMPH#: 2.2 10*3/uL (ref 0.9–3.3)
LYMPH%: 35.3 % (ref 14.0–49.7)
MCH: 33.3 pg (ref 25.1–34.0)
MCHC: 34 g/dL (ref 31.5–36.0)
MCV: 97.8 fL (ref 79.5–101.0)
MONO#: 0.7 10*3/uL (ref 0.1–0.9)
MONO%: 10.4 % (ref 0.0–14.0)
NEUT%: 49.5 % (ref 38.4–76.8)
NEUTROS ABS: 3.1 10*3/uL (ref 1.5–6.5)
NRBC: 0 % (ref 0–0)
PLATELETS: 211 10*3/uL (ref 145–400)
RBC: 4.09 10*6/uL (ref 3.70–5.45)
RDW: 14.5 % (ref 11.2–14.5)
WBC: 6.3 10*3/uL (ref 3.9–10.3)

## 2015-04-24 LAB — COMPREHENSIVE METABOLIC PANEL
ALBUMIN: 3.4 g/dL — AB (ref 3.5–5.0)
ALK PHOS: 65 U/L (ref 40–150)
ALT: 14 U/L (ref 0–55)
ANION GAP: 7 meq/L (ref 3–11)
AST: 14 U/L (ref 5–34)
BILIRUBIN TOTAL: 0.56 mg/dL (ref 0.20–1.20)
BUN: 12.6 mg/dL (ref 7.0–26.0)
CALCIUM: 9.4 mg/dL (ref 8.4–10.4)
CO2: 28 mEq/L (ref 22–29)
CREATININE: 0.9 mg/dL (ref 0.6–1.1)
Chloride: 107 mEq/L (ref 98–109)
EGFR: >90 mL/min/{1.73_m2} (ref >90)
Glucose: 97 mg/dl (ref 70–140)
Potassium: 4 mEq/L (ref 3.5–5.1)
Sodium: 142 mEq/L (ref 136–145)
TOTAL PROTEIN: 6.9 g/dL (ref 6.4–8.3)

## 2015-04-24 LAB — TECHNOLOGIST REVIEW

## 2015-04-24 MED ORDER — ANASTROZOLE 1 MG PO TABS: 1.0000 mg | ORAL_TABLET | Freq: Every day | ORAL | Status: DC

## 2015-04-24 NOTE — Progress Notes (Signed)
Visit  [place]  [phone] [phone]     ID: Danielle Oliver DOB: Sep 29, 1960  MR#: [id number]  OEU#:[id number]  Patient Care Team: Helane Rima, MD as PCP - General (Family Medicine) Thea Silversmith, MD as Consulting Physician (Radiation Oncology) Holley Bouche, NP as Nurse Practitioner (Nurse Practitioner) Chauncey Cruel, MD as Consulting Physician (Oncology) Alphonsa Overall, MD as Consulting Physician (General Surgery) PCP: Helane Rima, MD GYN: OTHER MD: [name] MD  CHIEF COMPLAINT: Stage IV breast cancer  CURRENT TREATMENT: Anastrozole, palbociclib, denosumab   BREAST CANCER HISTORY: From Dr Krista Blue [facility] 03/11/2014 intake note:  "She was diagnosed with stage III left breast cancer in 1991 when she was 55 years old. Unfortunately her breast cancer diagnosis and treatment history was not available for review today. Apparently she had left mastectomy, and 4 cycles of adjuvant chemotherapy under Dr. Mariana Kaufman care at our cancer center. Patient does not recall the name of the chemotherapy medication. She was also offered a clinical trial of bone marrow transplant for her breast cancer and she declined. She was followed by January mammograms and had no evidence of recurrence. Her last screening mammogram was in 2009 which was benign.  She noticed a lump in her right breast about 20 weeks ago. It was hard and the tender on palpitation. She denies any skin change or nipple discharge. She has no appetite change, weight loss, or other new symptoms. She had underwent a diagnostic mammogram on 01/12/2014, which showed a 3.6 cm lobulated solid mass in right upper quadrant of right breast, and an oval axillary lymph nodes was cortical thickening. 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 Her right axillary lymph node biopsy also showed  invasive adenocarcinoma."  Her subsequent history is as detailed below  INTERVAL HISTORY: [name] returns today for follow-up of her stage IV breast cancer. Today is day 1, cycle 6 of palbociclib. Fatigue continues to be the main complaint for her with this drug. Some days she barely wants to get out of bed. This is taking with anastrozole daily. She has hot flashes, but is not taking the gabapentin prescribed for this. She is also noticing more generalized aches that also encourage her to stay and bed and not move around much. She denies vaginal dryness.   REVIEW OF SYSTEMS: In addition to her generalized aches, she has a pronounced trigger finger to her right middle and ring fingers that she has to manually stretch out. She denies fevers, chills, nausea, vomiting, or changes in bowel or bladder habits. She is short of breath with exertion, but denies chest pain, cough, or palpitations. Her appetite is fair, but her water intake is not as high as she knows it should be. A detailed review of systems is otherwise stable.   PAST MEDICAL HISTORY: Past Medical History  Diagnosis Date  . Breast cancer ([place])   . Hypertension   . Gout   . Arthritis   . Morbid obesity with body mass index of 50.0-59.9 in adult [facility])   . Dysrhythmia   . Heart murmur     one Dr told her, "nothing to worry about"  . Shortness of breath dyspnea     With exertion  . Pneumonia 2013 ish    PAST SURGICAL HISTORY: Past Surgical History  Procedure Laterality Date  . Mastectomy Left 1992  . Tonsillectomy    . Fracture surgery  Right   . Colonoscopy    . Breast lumpectomy with radioactive seed localization Right 02/17/2015    Procedure: RIGHT BREAST LUMPECTOMY WITH RADIOACTIVE SEED LOCALIZATION;  Surgeon: Alphonsa Overall, MD;  Location: [facility];  Service: General;  Laterality: Right;    FAMILY HISTORY Family History  Problem Relation Age of Onset  . Colon cancer Father    the patient's father died at 72 from colon  cancer which was diagnosed shortly before his death. The patient's mother is living, age 54 as of July 2016. The patient is an only child. The only other cancer in the family to her knowledge is a paternal great aunt who developed breast cancer in her late 35s. There is no history of ovarian cancer and no other history of colon cancer in the family to her knowledge  GYNECOLOGIC HISTORY:  No LMP recorded. Patient is postmenopausal. menarche age 45 first live birth age 3 the patient is Ferdinand P3. She stopped having periods in 2013. She did not take hormone replacement.   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 She is divorced. At home is just she and her son Danielle Oliver, 105 years old. Son Danielle Oliver, 27, lives in [name] where he works as a Fish farm manager. Son Danielle Oliver, 25, lives in [place] and is working on a Oceanographer in counseling at [employer]. The patient has one granddaughter, Danielle Oliver, in [name]. She is not a church attender    ADVANCED DIRECTIVES: Not in place   HEALTH MAINTENANCE: Social History  Substance Use Topics  . Smoking status: Never Smoker   . Smokeless tobacco: Not on file  . Alcohol Use: No     Comment: rare     Colonoscopy: remote/ [name]  PAP: 2014  Bone density:  Lipid panel:  Allergies  Allergen Reactions  . Iodinated Diagnostic Agents Anaphylaxis  . Penicillins Rash    Has patient had a PCN reaction causing immediate rash, facial/tongue/throat swelling, SOB or lightheadedness with hypotension: Yes  Has patient had a PCN reaction causing severe rash involving mucus membranes or skin necrosis: No Has patient had a PCN reaction that required hospitalization No Has patient had a PCN reaction occurring within the last 10 years: Yes If all of the above answers are "NO", then may proceed with Cephalosporin use.    Current Outpatient Prescriptions  Medication Sig Dispense Refill  . amLODipine-olmesartan (AZOR) 10-40 MG tablet Take 1  tablet by mouth daily.    Marland Kitchen anastrozole (ARIMIDEX) 1 MG tablet Take 1 tablet (1 mg total) by mouth daily. 90 tablet 3  . Calcium Carb-Cholecalciferol (CALTRATE 600+D) 600-800 MG-UNIT TABS Take by mouth 2 (two) times daily. 60 tablet   . ibuprofen (ADVIL,MOTRIN) 200 MG tablet Take 200-400 mg by mouth daily as needed. For breast pain    . lovastatin (MEVACOR) 40 MG tablet Take 40 mg by mouth daily.    . palbociclib (IBRANCE) 100 MG capsule Take 1 capsule (100 mg total) by mouth daily with breakfast. Take whole with food for 21 days, rest 7 days and repeat. 21 capsule 6   No current facility-administered medications for this visit.    OBJECTIVE: Middle-aged African-American woman in no acute distress Filed Vitals:   04/24/15 0924  BP: 147/80  Pulse: 86  Temp: 98 F (36.7 C)  Resp: 18     Body mass index is 57.42 kg/(m^2).    ECOG FS:1 - Symptomatic but completely ambulatory  Sclerae unicteric, pupils round and equal Oropharynx clear  and moist-- no thrush or other lesions No cervical or supraclavicular adenopathy Lungs no rales or rhonchi Heart regular rate and rhythm Abd obese, soft, nontender, positive bowel sounds MSK no focal spinal tenderness, right middle and ring trigger fingers Neuro: nonfocal, well oriented, appropriate affect Breasts: deferred  LAB RESULTS: Results for EMMAGRACE, RUNKEL (MRN [id number]) as of 03/27/2015 16:17  Ref. Range 12/06/2014 16:06 12/27/2014 16:07 01/12/2015 15:51 02/09/2015 08:13 03/09/2015 08:20  CA 27.29 Latest Ref Range: 0-39 U/mL 101 (H) 95 (H) 103 (H) 94 (H) 86 (H)    CMP     Component Value Date/Time   NA 143 03/09/2015 0820   NA 143 02/16/2015 1152   K 3.9 03/09/2015 0820   K 4.1 02/16/2015 1152   CL 110 02/16/2015 1152   CO2 26 03/09/2015 0820   CO2 26 02/16/2015 1152   GLUCOSE 105 03/09/2015 0820   GLUCOSE 112* 02/16/2015 1152   BUN 17.4 03/09/2015 0820   BUN 11 02/16/2015 1152   CREATININE 1.1 03/09/2015 0820   CREATININE 0.81  02/16/2015 1152   CALCIUM 8.8 03/09/2015 0820   CALCIUM 8.9 02/16/2015 1152   PROT 7.1 03/09/2015 0820   ALBUMIN 3.5 03/09/2015 0820   AST 15 03/09/2015 0820   ALT 19 03/09/2015 0820   ALKPHOS 74 03/09/2015 0820   BILITOT 1.44* 03/09/2015 0820   GFRNONAA >60 02/16/2015 1152   GFRAA >60 02/16/2015 1152     INo results found for: SPEP, UPEP  Lab Results  Component Value Date   WBC 6.3 04/24/2015   NEUTROABS 3.1 04/24/2015   HGB 13.6 04/24/2015   HCT 40.0 04/24/2015   MCV 97.8 04/24/2015   PLT 211 04/24/2015      Chemistry      Component Value Date/Time   NA 143 03/09/2015 0820   NA 143 02/16/2015 1152   K 3.9 03/09/2015 0820   K 4.1 02/16/2015 1152   CL 110 02/16/2015 1152   CO2 26 03/09/2015 0820   CO2 26 02/16/2015 1152   BUN 17.4 03/09/2015 0820   BUN 11 02/16/2015 1152   CREATININE 1.1 03/09/2015 0820   CREATININE 0.81 02/16/2015 1152      Component Value Date/Time   CALCIUM 8.8 03/09/2015 0820   CALCIUM 8.9 02/16/2015 1152   ALKPHOS 74 03/09/2015 0820   AST 15 03/09/2015 0820   ALT 19 03/09/2015 0820   BILITOT 1.44* 03/09/2015 0820       Lab Results  Component Value Date   LABCA2 86* 03/09/2015    No components found for: [id number]  No results for input(s): INR in the last 168 hours.  Urinalysis    Component Value Date/Time   LABSPEC 1.015 04/27/2007 1654   PHURINE 6.0 04/27/2007 1654   GLUCOSEU NEGATIVE 04/27/2007 1654   HGBUR NEGATIVE 04/27/2007 1654   BILIRUBINUR NEGATIVE 04/27/2007 1654   KETONESUR NEGATIVE 04/27/2007 1654   PROTEINUR NEGATIVE 04/27/2007 1654   UROBILINOGEN 0.2 04/27/2007 1654   NITRITE NEGATIVE 04/27/2007 1654   LEUKOCYTESUR  04/27/2007 1654    NEGATIVE Biochemical Testing Only. Please order routine urinalysis from main lab if confirmatory testing is needed.   STUDIES: No results found.  ASSESSMENT: 55 y.o. [place] woman  (1) status post left mastectomy in 1991 followed by adjuvant chemotherapy 4, no further  details available  (2) status post right breast biopsy 01/13/2014 for a  cT2 p N1, stage IIB invasive ductal carcinoma, estrogen and progesterone receptor positive, HER-2 negative, with an MIB-1 of 10%.  METASTATIC  DISEASE: CT scan 03/18/2014 shows multiple sclerotic bone mets but no visceral disease  (1) no bone biopsy obtained to this point  (b) CA 27-29 is informative  (3) anastrozole started January 2016  (a)  palbociclib added 11/21/2014 at 125 mg daily, 21/7  (b) Palbociclib dose decreased to 100 mg daily, 21/ 7, with cycle 2, starting 12/21/2014  (4) vitamin D deficiency: On replacement  (5) status post right lumpectomy 02/17/2015 for a pT2 pNX invasive ductal carcinoma, grade 1, estrogen receptor 100% positive, progesterone receptor 20% positive, HER-2 not amplified. Margins were negative  (6) denosumab/ Xgeva started 12/15/2014, repeated monthly  (7) genetics testing pending  PLAN: Mallisa is having some side effects from the anastrozole and palbociclib, but they are not unmanageable issues. For the joint stiffness she is going to try naproxen and Tai Chi. Fatigue is a concern while in the midst of her palbociclib cycle, but she understands that more movement and exercise will be the answer to this as well. She feels unmotivated to leave the bed at times. Dr. Jana Hakim discussed the option of venlafaxine to improve her mood and hot flashes but she would like some time to think about this.   Dr. Jana Hakim also recommend she visit with Dr. Pablo Ledger for a radiation consultation visit. They discussed that there is no "standard of care" for recommending radiation therapy post lumpectomy for stage IV patients. But given her age, functional status, and the fact that we are dealing with mets to the bone only, we believe she will live long enough to where she may find some benefit in radiation to prevent a local recurrence.   I offered to refer her to Dr. Amedeo Plenty to work on her trigger fingers,  but she said she will call him herself as he has performed wrist surgery on her in the past.   Zaynah will return in 4 weeks for her next follow up visit. She understands and agrees with this plan. She knows the goal of treatment in her case is control. She has been encouraged to call with any issues that might arise before her next visit here.   Laurie Panda, NP   04/24/2015 11:00 AM    ADDENDUM: I discussed with Jeffrey of the issue of adjuvant radiation after lumpectomy in patients who already have metastatic disease.  We do not have a firm policy in this regard. There is simply not enough data out there to make firm conclusions. Therefore we'll make decisions on a case by case basis.  Basically, if a patient has rapidly advancing or extensive visceral disease, the whole issue of local control becomes much less important. Even if those patients had local recurrence we should be able to control it with radiation among other treatments in the time that they have before they succumb to their disease.  Loy situation is different. We think she can live for many years with a good quality of life on anti-estrogen therapy alone. In this setting it makes more sense to consider adjuvant radiation, since in general delayed radiation after local recurrence is already developed tends to be less effective.  Accordingly I urged her to meet with Dr. Pablo Ledger to discuss this further. Hopefully this can be done within the next week or 2. We also discussed the possible side effects and complications of radiation and I reassured her that these tend to be mild to moderate and temporary.  She is struggling a little with her anti-estrogens. I encouraged her to "viral through", since  many patients managed to adapt to the drugs and the symptoms can become much less of a problem with time. Certainly any other options we have to treat her are unlikely to have fewer side effects.  I personally saw this patient and  performed a substantive portion of this encounter with the listed APP documented above.   Chauncey Cruel, MD Medical Oncology and Hematology [facility] [address] [place], [place] [address] Tel. [phone]    Fax. [phone]

## 2015-04-24 NOTE — Telephone Encounter (Signed)
appt made and avs printed °

## 2015-04-25 ENCOUNTER — Ambulatory Visit (HOSPITAL_BASED_OUTPATIENT_CLINIC_OR_DEPARTMENT_OTHER): Payer: BC Managed Care – PPO

## 2015-04-25 VITALS — BP 149/81 | HR 80 | Temp 98.0°F

## 2015-04-25 DIAGNOSIS — C773 Secondary and unspecified malignant neoplasm of axilla and upper limb lymph nodes: Secondary | ICD-10-CM

## 2015-04-25 DIAGNOSIS — C50411 Malignant neoplasm of upper-outer quadrant of right female breast: Secondary | ICD-10-CM

## 2015-04-25 DIAGNOSIS — C50919 Malignant neoplasm of unspecified site of unspecified female breast: Secondary | ICD-10-CM

## 2015-04-25 DIAGNOSIS — C7951 Secondary malignant neoplasm of bone: Secondary | ICD-10-CM | POA: Diagnosis not present

## 2015-04-25 LAB — CANCER ANTIGEN 27-29 (PARALLEL TESTING): CA 27.29: 80 U/mL — ABNORMAL HIGH (ref <38)

## 2015-04-25 LAB — CANCER ANTIGEN 27.29: CA 27.29: 76.2 U/mL — ABNORMAL HIGH (ref 0.0–38.6)

## 2015-04-25 MED ORDER — DENOSUMAB 120 MG/1.7ML ~~LOC~~ SOLN
120.0000 mg | Freq: Once | SUBCUTANEOUS | Status: AC
  Administered 2015-04-25: 120 mg via SUBCUTANEOUS
  Filled 2015-04-25: qty 1.7

## 2015-04-28 ENCOUNTER — Telehealth: Payer: Self-pay | Admitting: *Deleted

## 2015-04-28 NOTE — Telephone Encounter (Signed)
Spoke with patient today concerning an appointment with Dr. Pablo Ledger.  Offered to get her an appointment.   She states she is going to think about it over the weekend.  She is not sure what she wants to do regarding radiation.  Encouraged her to call with any needs or questions or if she needed to help facilitate an appointment.

## 2015-05-22 ENCOUNTER — Ambulatory Visit (HOSPITAL_BASED_OUTPATIENT_CLINIC_OR_DEPARTMENT_OTHER): Payer: BC Managed Care – PPO

## 2015-05-22 ENCOUNTER — Other Ambulatory Visit (HOSPITAL_BASED_OUTPATIENT_CLINIC_OR_DEPARTMENT_OTHER): Payer: BC Managed Care – PPO

## 2015-05-22 ENCOUNTER — Other Ambulatory Visit: Payer: Self-pay | Admitting: *Deleted

## 2015-05-22 ENCOUNTER — Ambulatory Visit (HOSPITAL_BASED_OUTPATIENT_CLINIC_OR_DEPARTMENT_OTHER): Payer: BC Managed Care – PPO | Admitting: Nurse Practitioner

## 2015-05-22 ENCOUNTER — Encounter: Payer: Self-pay | Admitting: Nurse Practitioner

## 2015-05-22 VITALS — BP 128/77 | HR 96 | Temp 98.3°F | Resp 18 | Ht 67.0 in | Wt 367.0 lb

## 2015-05-22 DIAGNOSIS — C50411 Malignant neoplasm of upper-outer quadrant of right female breast: Secondary | ICD-10-CM | POA: Diagnosis not present

## 2015-05-22 DIAGNOSIS — C7951 Secondary malignant neoplasm of bone: Principal | ICD-10-CM

## 2015-05-22 DIAGNOSIS — C773 Secondary and unspecified malignant neoplasm of axilla and upper limb lymph nodes: Secondary | ICD-10-CM

## 2015-05-22 DIAGNOSIS — Z170 Estrogen receptor positive status [ER+]: Secondary | ICD-10-CM

## 2015-05-22 DIAGNOSIS — E559 Vitamin D deficiency, unspecified: Secondary | ICD-10-CM | POA: Diagnosis not present

## 2015-05-22 DIAGNOSIS — C50919 Malignant neoplasm of unspecified site of unspecified female breast: Secondary | ICD-10-CM

## 2015-05-22 LAB — COMPREHENSIVE METABOLIC PANEL
ALBUMIN: 3.6 g/dL (ref 3.5–5.0)
ALT: 13 U/L (ref 0–55)
AST: 14 U/L (ref 5–34)
Alkaline Phosphatase: 64 U/L (ref 40–150)
Anion Gap: 7 mEq/L (ref 3–11)
BILIRUBIN TOTAL: 0.92 mg/dL (ref 0.20–1.20)
BUN: 14 mg/dL (ref 7.0–26.0)
CO2: 29 meq/L (ref 22–29)
CREATININE: 1 mg/dL (ref 0.6–1.1)
Calcium: 9.4 mg/dL (ref 8.4–10.4)
Chloride: 107 mEq/L (ref 98–109)
EGFR: 77 mL/min/{1.73_m2} — ABNORMAL LOW (ref >90)
GLUCOSE: 96 mg/dL (ref 70–140)
Potassium: 3.9 mEq/L (ref 3.5–5.1)
SODIUM: 143 meq/L (ref 136–145)
TOTAL PROTEIN: 7.2 g/dL (ref 6.4–8.3)

## 2015-05-22 LAB — CBC WITH DIFFERENTIAL/PLATELET
BASO%: 1.7 % (ref 0.0–2.0)
Basophils Absolute: 0.1 10*3/uL (ref 0.0–0.1)
EOS%: 3.2 % (ref 0.0–7.0)
Eosinophils Absolute: 0.2 10*3/uL (ref 0.0–0.5)
HCT: 37.3 % (ref 34.8–46.6)
HEMOGLOBIN: 12.6 g/dL (ref 11.6–15.9)
LYMPH%: 33.5 % (ref 14.0–49.7)
MCH: 33.2 pg (ref 25.1–34.0)
MCHC: 33.8 g/dL (ref 31.5–36.0)
MCV: 98.2 fL (ref 79.5–101.0)
MONO#: 0.5 10*3/uL (ref 0.1–0.9)
MONO%: 9 % (ref 0.0–14.0)
NEUT%: 52.6 % (ref 38.4–76.8)
NEUTROS ABS: 2.8 10*3/uL (ref 1.5–6.5)
Platelets: 220 10*3/uL (ref 145–400)
RBC: 3.8 10*6/uL (ref 3.70–5.45)
RDW: 14.4 % (ref 11.2–14.5)
WBC: 5.3 10*3/uL (ref 3.9–10.3)
lymph#: 1.8 10*3/uL (ref 0.9–3.3)

## 2015-05-22 MED ORDER — DENOSUMAB 120 MG/1.7ML ~~LOC~~ SOLN
120.0000 mg | Freq: Once | SUBCUTANEOUS | Status: AC
  Administered 2015-05-22: 120 mg via SUBCUTANEOUS
  Filled 2015-05-22: qty 1.7

## 2015-05-22 MED ORDER — PALBOCICLIB 100 MG PO CAPS: 100.0000 mg | ORAL_CAPSULE | Freq: Every day | ORAL | Status: DC

## 2015-05-22 NOTE — Progress Notes (Signed)
Visit  [place]  [phone] Fax:(336) [phone]   ID: Danielle Oliver DOB: 1960-10-03  MR#: [id number]  WSF#:[id number]  Patient Care Team: Helane Rima, MD as PCP - General (Family Medicine) Thea Silversmith, MD as Consulting Physician (Radiation Oncology) Holley Bouche, NP as Nurse Practitioner (Nurse Practitioner) Chauncey Cruel, MD as Consulting Physician (Oncology) Alphonsa Overall, MD as Consulting Physician (General Surgery) PCP: Helane Rima, MD GYN: OTHER MD: [name] MD  CHIEF COMPLAINT: Stage IV breast cancer  CURRENT TREATMENT: Anastrozole, palbociclib, denosumab   BREAST CANCER HISTORY: From Dr Krista Blue [facility] 03/11/2014 intake note:  "She was diagnosed with stage III left breast cancer in 1991 when she was 55 years old. Unfortunately her breast cancer diagnosis and treatment history was not available for review today. Apparently she had left mastectomy, and 4 cycles of adjuvant chemotherapy under Dr. Mariana Kaufman care at our cancer center. Patient does not recall the name of the chemotherapy medication. She was also offered a clinical trial of bone marrow transplant for her breast cancer and she declined. She was followed by January mammograms and had no evidence of recurrence. Her last screening mammogram was in 2009 which was benign.  She noticed a lump in her right breast about 20 weeks ago. It was hard and the tender on palpitation. She denies any skin change or nipple discharge. She has no appetite change, weight loss, or other new symptoms. She had underwent a diagnostic mammogram on 01/12/2014, which showed a 3.6 cm lobulated solid mass in right upper quadrant of right breast, and an oval axillary lymph nodes was cortical thickening. 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 Her right axillary lymph node biopsy also showed  invasive adenocarcinoma."  Her subsequent history is as detailed below  INTERVAL HISTORY: [name] returns today for follow-up of her stage IV breast cancer. Today is day 20, cycle 6 of palbociclib (158m 21 days on, 7 days off). She tolerates this well besides fatigue. Sometimes it keeps her in bed all day. This is taken with anastrozole daily. She has hot flashes but does not want to take the gabapentin that was prescribed to her because of the side effects that she read about. She denies vaginal dryness. The interval history is remarkable for a "stomach bug" she caught from her daughter. She was nauseous and ran a fever. This resolved 2 weeks ago.  REVIEW OF SYSTEMS: ATauheedahnow denies fevers, chills, nausea, vomiting, or changes in bowel or bladder habits. She is short of breath with exertion, but denies chest pain, cough, or palpitations. Her appetite is fair. She has generalized achyness, and trigger finger  to her right middle and ring fingers that she has to manually stretch out. Her follow up with an orthopedic specialist is not until late April. A detailed review of systems is otherwise stable.   PAST MEDICAL HISTORY: Past Medical History  Diagnosis Date  . Breast cancer ([place]   . Hypertension   . Gout   . Arthritis   . Morbid obesity with body mass index of 50.0-59.9 in adult ([facility]   . Dysrhythmia   . Heart murmur     one Dr told her, "nothing to worry about"  . Shortness of breath dyspnea     With exertion  . Pneumonia 2013 ish    PAST SURGICAL HISTORY: Past Surgical History  Procedure Laterality Date  . Mastectomy Left  1992  . Tonsillectomy    . Fracture surgery Right   . Colonoscopy    . Breast lumpectomy with radioactive seed localization Right 02/17/2015    Procedure: RIGHT BREAST LUMPECTOMY WITH RADIOACTIVE SEED LOCALIZATION;  Surgeon: Alphonsa Overall, MD;  Location: [facility];  Service: General;  Laterality: Right;    FAMILY HISTORY Family History  Problem Relation Age  of Onset  . Colon cancer Father    the patient's father died at 81 from colon cancer which was diagnosed shortly before his death. The patient's mother is living, age 34 as of July 2016. The patient is an only child. The only other cancer in the family to her knowledge is a paternal great aunt who developed breast cancer in her late 71s. There is no history of ovarian cancer and no other history of colon cancer in the family to her knowledge  GYNECOLOGIC HISTORY:  No LMP recorded. Patient is postmenopausal. menarche age 35 first live birth age 18 the patient is Foley P3. She stopped having periods in 2013. She did not take hormone replacement.   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 She is divorced. At home is just she and her son [name], 31 years old. Son [name], 27, lives in [name] where he works as a Fish farm manager. Son [name], 25, lives in [place] and is working on a Oceanographer in counseling at [employer]. The patient has one granddaughter, [name], in [name]. She is not a church attender    ADVANCED DIRECTIVES: Not in place   HEALTH MAINTENANCE: Social History  Substance Use Topics  . Smoking status: Never Smoker   . Smokeless tobacco: Not on file  . Alcohol Use: No     Comment: rare     Colonoscopy: remote/ [name]  PAP: 2014  Bone density:  Lipid panel:  Allergies  Allergen Reactions  . Iodinated Diagnostic Agents Anaphylaxis  . Penicillins Rash    Has patient had a PCN reaction causing immediate rash, facial/tongue/throat swelling, SOB or lightheadedness with hypotension: Yes  Has patient had a PCN reaction causing severe rash involving mucus membranes or skin necrosis: No Has patient had a PCN reaction that required hospitalization No Has patient had a PCN reaction occurring within the last 10 years: Yes If all of the above answers are "NO", then may proceed with Cephalosporin use.    Current Outpatient Prescriptions    Medication Sig Dispense Refill  . amLODipine-olmesartan (AZOR) 10-40 MG tablet Take 1 tablet by mouth daily.    Marland Kitchen anastrozole (ARIMIDEX) 1 MG tablet Take 1 tablet (1 mg total) by mouth daily. 90 tablet 3  . Calcium Carb-Cholecalciferol (CALTRATE 600+D) 600-800 MG-UNIT TABS Take by mouth 2 (two) times daily. 60 tablet   . lovastatin (MEVACOR) 40 MG tablet Take 40 mg by mouth daily.    Marland Kitchen ibuprofen (ADVIL,MOTRIN) 200 MG tablet Take 200-400 mg by mouth daily as needed. Reported on 05/22/2015    . palbociclib (IBRANCE) 100 MG capsule Take 1 capsule (100 mg total) by mouth daily with breakfast. Take whole with food for 21 days, rest 7 days and repeat. 21 capsule 5   No current facility-administered medications for this visit.    OBJECTIVE: Middle-aged African-American woman in no acute distress Filed Vitals:   05/22/15 1526  BP: 128/77  Pulse: 96  Temp: 98.3 F (36.8 C)  Resp: 18     Body mass index is 57.47 kg/(m^2).    ECOG FS:1 - Symptomatic but  completely ambulatory  Skin: warm, dry  HEENT: sclerae anicteric, conjunctivae pink, oropharynx clear. No thrush or mucositis.  Lymph Nodes: No cervical or supraclavicular lymphadenopathy  Lungs: clear to auscultation bilaterally, no rales, wheezes, or rhonci  Heart: regular rate and rhythm  Abdomen: round, soft, non tender, positive bowel sounds  Musculoskeletal: No focal spinal tenderness, no peripheral edema  Neuro: non focal, well oriented, positive affect  Breasts: deferred  LAB RESULTS: Results for ETHLYN, ALTO (MRN [id number]) as of 05/22/2015 16:08  Ref. Range 12/27/2014 16:07 01/12/2015 15:51 02/09/2015 08:13 03/09/2015 08:20 04/24/2015 10:47  CA 27.29 Latest Ref Range: <38 U/mL 95 (H) 103 (H) 94 (H) 86 (H) 80 (H)    CMP     Component Value Date/Time   NA 143 05/22/2015 1509   NA 143 02/16/2015 1152   K 3.9 05/22/2015 1509   K 4.1 02/16/2015 1152   CL 110 02/16/2015 1152   CO2 29 05/22/2015 1509   CO2 26 02/16/2015 1152    GLUCOSE 96 05/22/2015 1509   GLUCOSE 112* 02/16/2015 1152   BUN 14.0 05/22/2015 1509   BUN 11 02/16/2015 1152   CREATININE 1.0 05/22/2015 1509   CREATININE 0.81 02/16/2015 1152   CALCIUM 9.4 05/22/2015 1509   CALCIUM 8.9 02/16/2015 1152   PROT 7.2 05/22/2015 1509   ALBUMIN 3.6 05/22/2015 1509   AST 14 05/22/2015 1509   ALT 13 05/22/2015 1509   ALKPHOS 64 05/22/2015 1509   BILITOT 0.92 05/22/2015 1509   GFRNONAA >60 02/16/2015 1152   GFRAA >60 02/16/2015 1152     INo results found for: SPEP, UPEP  Lab Results  Component Value Date   WBC 5.3 05/22/2015   NEUTROABS 2.8 05/22/2015   HGB 12.6 05/22/2015   HCT 37.3 05/22/2015   MCV 98.2 05/22/2015   PLT 220 05/22/2015      Chemistry      Component Value Date/Time   NA 143 05/22/2015 1509   NA 143 02/16/2015 1152   K 3.9 05/22/2015 1509   K 4.1 02/16/2015 1152   CL 110 02/16/2015 1152   CO2 29 05/22/2015 1509   CO2 26 02/16/2015 1152   BUN 14.0 05/22/2015 1509   BUN 11 02/16/2015 1152   CREATININE 1.0 05/22/2015 1509   CREATININE 0.81 02/16/2015 1152      Component Value Date/Time   CALCIUM 9.4 05/22/2015 1509   CALCIUM 8.9 02/16/2015 1152   ALKPHOS 64 05/22/2015 1509   AST 14 05/22/2015 1509   ALT 13 05/22/2015 1509   BILITOT 0.92 05/22/2015 1509       Lab Results  Component Value Date   LABCA2 80* 04/24/2015    No components found for: [id number]  No results for input(s): INR in the last 168 hours.  Urinalysis    Component Value Date/Time   LABSPEC 1.015 04/27/2007 1654   PHURINE 6.0 04/27/2007 1654   GLUCOSEU NEGATIVE 04/27/2007 1654   HGBUR NEGATIVE 04/27/2007 1654   BILIRUBINUR NEGATIVE 04/27/2007 1654   KETONESUR NEGATIVE 04/27/2007 1654   PROTEINUR NEGATIVE 04/27/2007 1654   UROBILINOGEN 0.2 04/27/2007 1654   NITRITE NEGATIVE 04/27/2007 1654   LEUKOCYTESUR  04/27/2007 1654    NEGATIVE Biochemical Testing Only. Please order routine urinalysis from main lab if confirmatory testing is needed.     STUDIES: No results found.  ASSESSMENT: 55 y.o. [place] woman  (1) status post left mastectomy in 1991 followed by adjuvant chemotherapy 4, no further details available  (2) status post right breast biopsy 01/13/2014  for a  cT2 p N1, stage IIB invasive ductal carcinoma, estrogen and progesterone receptor positive, HER-2 negative, with an MIB-1 of 10%.  METASTATIC DISEASE: CT scan 03/18/2014 shows multiple sclerotic bone mets but no visceral disease  (1) no bone biopsy obtained to this point  (b) CA 27-29 is informative  (3) anastrozole started January 2016  (a)  palbociclib added 11/21/2014 at 125 mg daily, 21/7  (b) Palbociclib dose decreased to 100 mg daily, 21/ 7, with cycle 2, starting 12/21/2014  (4) vitamin D deficiency: On replacement  (5) status post right lumpectomy 02/17/2015 for a pT2 pNX invasive ductal carcinoma, grade 1, estrogen receptor 100% positive, progesterone receptor 20% positive, HER-2 not amplified. Margins were negative  (6) denosumab/ Xgeva started 12/15/2014, repeated monthly  (7) genetics testing offered. Patient still deciding  (8) radiation therapy to lumpectomy site declined by patient  PLAN: Gerard is doing well today. The labs were reviewed in detail and were stable. Normally she would start her next cycle of palbociblib this Wednesday, but she is out of town early next week for work and does not want her fatigue to peak during this time. We negotiated delaying the start of this cycle by 1 week, so that she will be at home when it is time to go up against this side effect. She will receive her next denosumab injection this afternoon.  She has informed me that she has given the idea of radiation consideration, but she politely declines at this time. She understands that she is foregoing a reduction in risk of local recurrence by choosing this option.  I have placed orders for a bone scan to be performed in mid April. She will return a few days  later to review the results with Dr. Jana Hakim. She understands and agrees with this plan. She knows the goal of treatment in her case is control. She has been encouraged to call with any issues that might arise before her next visit here.  Laurie Panda, NP

## 2015-05-23 LAB — CANCER ANTIGEN 27.29: CA 27.29: 83.6 U/mL — ABNORMAL HIGH (ref 0.0–38.6)

## 2015-05-23 LAB — VITAMIN D 25 HYDROXY (VIT D DEFICIENCY, FRACTURES): VIT D 25 HYDROXY: 21.6 ng/mL — AB (ref 30.0–100.0)

## 2015-05-23 LAB — CANCER ANTIGEN 27-29 (PARALLEL TESTING): CA 27.29: 85 U/mL — AB (ref <38)

## 2015-05-25 ENCOUNTER — Other Ambulatory Visit: Payer: Self-pay | Admitting: *Deleted

## 2015-05-25 DIAGNOSIS — C50411 Malignant neoplasm of upper-outer quadrant of right female breast: Secondary | ICD-10-CM

## 2015-05-25 MED ORDER — AMLODIPINE-OLMESARTAN 10-40 MG PO TABS: 1.0000 | ORAL_TABLET | Freq: Every day | ORAL | Status: DC

## 2015-05-25 NOTE — Telephone Encounter (Signed)
Received call from patient requesting a refill on her Azor.  She was not able to get in touch with her PCP and she is going out of town.  Refill sent to her pharmacy.

## 2015-06-09 ENCOUNTER — Other Ambulatory Visit: Payer: Self-pay | Admitting: Oncology

## 2015-06-19 ENCOUNTER — Ambulatory Visit (HOSPITAL_BASED_OUTPATIENT_CLINIC_OR_DEPARTMENT_OTHER): Payer: BC Managed Care – PPO

## 2015-06-19 ENCOUNTER — Telehealth: Payer: Self-pay | Admitting: Oncology

## 2015-06-19 ENCOUNTER — Other Ambulatory Visit (HOSPITAL_BASED_OUTPATIENT_CLINIC_OR_DEPARTMENT_OTHER): Payer: BC Managed Care – PPO

## 2015-06-19 ENCOUNTER — Ambulatory Visit (HOSPITAL_BASED_OUTPATIENT_CLINIC_OR_DEPARTMENT_OTHER): Payer: BC Managed Care – PPO | Admitting: Oncology

## 2015-06-19 VITALS — BP 126/85 | HR 92 | Temp 97.9°F | Resp 20 | Ht 67.0 in | Wt 362.5 lb

## 2015-06-19 DIAGNOSIS — C773 Secondary and unspecified malignant neoplasm of axilla and upper limb lymph nodes: Secondary | ICD-10-CM | POA: Diagnosis not present

## 2015-06-19 DIAGNOSIS — C7951 Secondary malignant neoplasm of bone: Secondary | ICD-10-CM

## 2015-06-19 DIAGNOSIS — Z79811 Long term (current) use of aromatase inhibitors: Secondary | ICD-10-CM

## 2015-06-19 DIAGNOSIS — Z853 Personal history of malignant neoplasm of breast: Secondary | ICD-10-CM | POA: Diagnosis not present

## 2015-06-19 DIAGNOSIS — C50411 Malignant neoplasm of upper-outer quadrant of right female breast: Secondary | ICD-10-CM | POA: Diagnosis not present

## 2015-06-19 DIAGNOSIS — G4700 Insomnia, unspecified: Secondary | ICD-10-CM

## 2015-06-19 DIAGNOSIS — C50911 Malignant neoplasm of unspecified site of right female breast: Secondary | ICD-10-CM

## 2015-06-19 DIAGNOSIS — C50919 Malignant neoplasm of unspecified site of unspecified female breast: Secondary | ICD-10-CM

## 2015-06-19 DIAGNOSIS — Z170 Estrogen receptor positive status [ER+]: Secondary | ICD-10-CM

## 2015-06-19 LAB — CBC WITH DIFFERENTIAL/PLATELET
BASO%: 1.8 % (ref 0.0–2.0)
Basophils Absolute: 0.1 10*3/uL (ref 0.0–0.1)
EOS%: 3 % (ref 0.0–7.0)
Eosinophils Absolute: 0.2 10*3/uL (ref 0.0–0.5)
HCT: 36.4 % (ref 34.8–46.6)
HEMOGLOBIN: 12.4 g/dL (ref 11.6–15.9)
LYMPH#: 1.6 10*3/uL (ref 0.9–3.3)
LYMPH%: 31.5 % (ref 14.0–49.7)
MCH: 33.2 pg (ref 25.1–34.0)
MCHC: 34.1 g/dL (ref 31.5–36.0)
MCV: 97.3 fL (ref 79.5–101.0)
MONO#: 0.2 10*3/uL (ref 0.1–0.9)
MONO%: 4.4 % (ref 0.0–14.0)
NEUT%: 59.3 % (ref 38.4–76.8)
NEUTROS ABS: 3 10*3/uL (ref 1.5–6.5)
Platelets: 284 10*3/uL (ref 145–400)
RBC: 3.74 10*6/uL (ref 3.70–5.45)
RDW: 14 % (ref 11.2–14.5)
WBC: 5 10*3/uL (ref 3.9–10.3)

## 2015-06-19 LAB — COMPREHENSIVE METABOLIC PANEL
ALT: 10 U/L (ref 0–55)
AST: 14 U/L (ref 5–34)
Albumin: 3.5 g/dL (ref 3.5–5.0)
Alkaline Phosphatase: 68 U/L (ref 40–150)
Anion Gap: 9 mEq/L (ref 3–11)
BUN: 11.3 mg/dL (ref 7.0–26.0)
CHLORIDE: 110 meq/L — AB (ref 98–109)
CO2: 24 meq/L (ref 22–29)
CREATININE: 0.9 mg/dL (ref 0.6–1.1)
Calcium: 9 mg/dL (ref 8.4–10.4)
EGFR: 86 mL/min/{1.73_m2} — ABNORMAL LOW (ref >90)
GLUCOSE: 98 mg/dL (ref 70–140)
Potassium: 4 mEq/L (ref 3.5–5.1)
SODIUM: 142 meq/L (ref 136–145)
TOTAL PROTEIN: 7.2 g/dL (ref 6.4–8.3)
Total Bilirubin: 1.01 mg/dL (ref 0.20–1.20)

## 2015-06-19 MED ORDER — DENOSUMAB 120 MG/1.7ML ~~LOC~~ SOLN
120.0000 mg | Freq: Once | SUBCUTANEOUS | Status: AC
  Administered 2015-06-19: 120 mg via SUBCUTANEOUS
  Filled 2015-06-19: qty 1.7

## 2015-06-19 NOTE — Telephone Encounter (Signed)
appt made and avs printed °

## 2015-06-19 NOTE — Progress Notes (Signed)
Visit  [facility]  [phone] Fax:([phone]   ID: MEEKAH MATH DOB: 25-Dec-1960  MR#: [id number]  RJG#:[id number]  Patient Care Team: Devra Dopp, MD as PCP - General (Family Medicine) Lurline Hare, MD as Consulting Physician (Radiation Oncology) Hubbard Hartshorn, NP as Nurse Practitioner (Nurse Practitioner) Lowella Dell, MD as Consulting Physician (Oncology) Ovidio Kin, MD as Consulting Physician (General Surgery) PCP: Devra Dopp, MD GYN: OTHER MD: Lennie Odor MD  CHIEF COMPLAINT: Stage IV breast cancer  CURRENT TREATMENT: Anastrozole, palbociclib, denosumab   BREAST CANCER HISTORY: From Dr Terrace [facility] 03/11/2014 intake note:  "She was diagnosed with stage III left breast cancer in 1991 when she was 55 years old. Unfortunately her breast cancer diagnosis and treatment history was not available for review today. Apparently she had left mastectomy, and 4 cycles of adjuvant chemotherapy under Dr. Precious Reel care at our cancer center. Patient does not recall the name of the chemotherapy medication. She was also offered a clinical trial of bone marrow transplant for her breast cancer and she declined. She was followed by January mammograms and had no evidence of recurrence. Her last screening mammogram was in 2009 which was benign.  She noticed a lump in her right breast about 20 weeks ago. It was hard and the tender on palpitation. She denies any skin change or nipple discharge. She has no appetite change, weight loss, or other new symptoms. She had underwent a diagnostic mammogram on 01/12/2014, which showed a 3.6 cm lobulated solid mass in right upper quadrant of right breast, and an oval axillary lymph nodes was cortical thickening. 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 She had been scheduled for a bone scan prior to today's visit, and she was called with the date, but she says she didn't really understand why she needed it so she canceled.  She continues on anastrozole, with very good tolerance. Hot flashes, night sweats, and vaginal dryness are not major issues. She does not complain of arthralgias or myalgias above the baseline level of arthritis she experienced prior to starting this treatment (of course she also has a history of gout).  She is also tolerating the palbociclib well. She does complain of some fatigue. She is currently only on the second week of her cycle because she took a trip to [place] and waited until her return to start. Her counts have held up very nicely so far  Finally she is receiving Xgeva monthly, with no side effects that she is aware of.  Today she brought me some records from her prior chemotherapy and they show that she received cyclophosphamide and doxorubicin not in dose dense fashion between February and April 1993.  REVIEW OF SYSTEMS: Agapita has some sinus problems which are seasonal and currently has a little bit of a dry cough associated with that. This can keep her at night. She is using antihistamines and a nasal spray. However what really keeps her up at night is not clear to her. Usually between 2 and 4 AM she wakes up. Sometimes she get out of bed most of the time she states and that until 6. She goes to bed around 10. It isn't hot flashes and  it isn't always nocturia that wakes her up I wonder if we should consider a sleep study to try to clear this up. Aside from that she sat a little bit of peeling in her hands which tends to occur on the week off treatment not the weeks on palbociclib. Aside from these issues a detailed review of systems today was  noncontributory.   PAST MEDICAL HISTORY: Past Medical History  Diagnosis Date  . Breast cancer ([place])   . Hypertension   . Gout   . Arthritis   . Morbid obesity with body mass index of 50.0-59.9 in adult [facility])   . Dysrhythmia   . Heart murmur     one Dr told her, "nothing to worry about"  . Shortness of breath dyspnea     With exertion  . Pneumonia 2013 ish    PAST SURGICAL HISTORY: Past Surgical History  Procedure Laterality Date  . Mastectomy Left 1992  . Tonsillectomy    . Fracture surgery Right   . Colonoscopy    . Breast lumpectomy with radioactive seed localization Right 02/17/2015    Procedure: RIGHT BREAST LUMPECTOMY WITH RADIOACTIVE SEED LOCALIZATION;  Surgeon: Alphonsa Overall, MD;  Location: [facility];  Service: General;  Laterality: Right;    FAMILY HISTORY Family History  Problem Relation Age of Onset  . Colon cancer Father    the patient's father died at 20 from colon cancer which was diagnosed shortly before his death. The patient's mother is living, age 73 as of July 2016. The patient is an only child. The only other cancer in the family to her knowledge is a paternal great aunt who developed breast cancer in her late 33s. There is no history of ovarian cancer and no other history of colon cancer in the family to her knowledge  GYNECOLOGIC HISTORY:  No LMP recorded. Patient is postmenopausal. menarche age 79 first live birth age 13 the patient is Bad[place] P3. She stopped having periods in 2013. She did not take hormone replacement.   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 She is divorced. At home is just she and her son [name], 53 years old. Son [name], 27, lives in [name] where he works as a Fish farm manager. Son [name], 25, lives in [place] and is working on a Oceanographer in counseling at [employer]. The patient has one granddaughter, [name], in [name]. She is not a church attender    ADVANCED DIRECTIVES: Not in  place   HEALTH MAINTENANCE: Social History  Substance Use Topics  . Smoking status: Never Smoker   . Smokeless tobacco: Not on file  . Alcohol Use: No     Comment: rare     Colonoscopy: remote/ [name]  PAP: 2014  Bone density:  Lipid panel:  Allergies  Allergen Reactions  . Iodinated Diagnostic Agents Anaphylaxis  . Penicillins Rash    Has patient had a PCN reaction causing immediate rash, facial/tongue/throat swelling, SOB or lightheadedness with hypotension: Yes  Has patient had a PCN reaction causing severe rash involving mucus membranes or skin necrosis: No Has patient had a PCN reaction that required hospitalization No Has patient had a PCN reaction occurring within the last 10 years: Yes If all of the above answers are "NO", then may proceed with Cephalosporin use.    Current Outpatient Prescriptions  Medication Sig Dispense Refill  . amLODipine-olmesartan (AZOR) 10-40 MG tablet Take 1 tablet by mouth daily. 30 tablet 0  . anastrozole (ARIMIDEX) 1 MG  tablet Take 1 tablet (1 mg total) by mouth daily. 90 tablet 3  . Calcium Carb-Cholecalciferol (CALTRATE 600+D) 600-800 MG-UNIT TABS Take by mouth 2 (two) times daily. 60 tablet   . ibuprofen (ADVIL,MOTRIN) 200 MG tablet Take 200-400 mg by mouth daily as needed. Reported on 05/22/2015    . lovastatin (MEVACOR) 40 MG tablet Take 40 mg by mouth daily.    . palbociclib (IBRANCE) 100 MG capsule Take 1 capsule (100 mg total) by mouth daily with breakfast. Take whole with food for 21 days, rest 7 days and repeat. 21 capsule 5   No current facility-administered medications for this visit.    OBJECTIVE: Middle-aged African-American woman Who appears stated age 89 Vitals:   06/19/15 1547  BP: 126/85  Pulse: 92  Temp: 97.9 F (36.6 C)  Resp: 20     Body mass index is 56.76 kg/(m^2).    ECOG FS:1 - Symptomatic but completely ambulatory  Sclerae unicteric, pupils round and equal Oropharynx clear and moist-- no thrush or other  lesions No cervical or supraclavicular adenopathy Lungs no rales or rhonchi Heart regular rate and rhythm Abd soft, obese, nontender, positive bowel sounds MSK no focal spinal tenderness, no upper extremity lymphedema Neuro: nonfocal, well oriented, appropriate affect Breasts: Deferred  LAB RESULTS:  CMP     Component Value Date/Time   NA 142 06/19/2015 1527   NA 143 02/16/2015 1152   K 4.0 06/19/2015 1527   K 4.1 02/16/2015 1152   CL 110 02/16/2015 1152   CO2 24 06/19/2015 1527   CO2 26 02/16/2015 1152   GLUCOSE 98 06/19/2015 1527   GLUCOSE 112* 02/16/2015 1152   BUN 11.3 06/19/2015 1527   BUN 11 02/16/2015 1152   CREATININE 0.9 06/19/2015 1527   CREATININE 0.81 02/16/2015 1152   CALCIUM 9.0 06/19/2015 1527   CALCIUM 8.9 02/16/2015 1152   PROT 7.2 06/19/2015 1527   ALBUMIN 3.5 06/19/2015 1527   AST 14 06/19/2015 1527   ALT 10 06/19/2015 1527   ALKPHOS 68 06/19/2015 1527   BILITOT 1.01 06/19/2015 1527   GFRNONAA >60 02/16/2015 1152   GFRAA >60 02/16/2015 1152     INo results found for: SPEP, UPEP  Lab Results  Component Value Date   WBC 5.0 06/19/2015   NEUTROABS 3.0 06/19/2015   HGB 12.4 06/19/2015   HCT 36.4 06/19/2015   MCV 97.3 06/19/2015   PLT 284 06/19/2015      Chemistry      Component Value Date/Time   NA 142 06/19/2015 1527   NA 143 02/16/2015 1152   K 4.0 06/19/2015 1527   K 4.1 02/16/2015 1152   CL 110 02/16/2015 1152   CO2 24 06/19/2015 1527   CO2 26 02/16/2015 1152   BUN 11.3 06/19/2015 1527   BUN 11 02/16/2015 1152   CREATININE 0.9 06/19/2015 1527   CREATININE 0.81 02/16/2015 1152      Component Value Date/Time   CALCIUM 9.0 06/19/2015 1527   CALCIUM 8.9 02/16/2015 1152   ALKPHOS 68 06/19/2015 1527   AST 14 06/19/2015 1527   ALT 10 06/19/2015 1527   BILITOT 1.01 06/19/2015 1527       Lab Results  Component Value Date   LABCA2 85* 05/22/2015    No components found for: [id number]  No results for input(s): INR in the last  168 hours.  Urinalysis    Component Value Date/Time   LABSPEC 1.015 04/27/2007 1654   PHURINE 6.0 04/27/2007 1654   GLUCOSEU NEGATIVE 04/27/2007  [place] 04/27/2007 [place] 04/27/2007 [place] 04/27/2007 1654   PROTEINUR NEGATIVE 04/27/2007 1654   UROBILINOGEN 0.2 04/27/2007 1654   NITRITE NEGATIVE 04/27/2007 1654   LEUKOCYTESUR  04/27/2007 1654    NEGATIVE Biochemical Testing Only. Please order routine urinalysis from main lab if confirmatory testing is needed.   STUDIES: No results found.  ASSESSMENT: 55 y.o. [place] woman  (1) status post left mastectomy in 1991 followed by adjuvant chemotherapy Consisting of doxorubicin and cyclophosphamide given every 21 days 4 (records under "media")  (2) status post right breast biopsy 01/13/2014 for a  cT2 p N1, stage IIB invasive ductal carcinoma, estrogen and progesterone receptor positive, HER-2 negative, with an MIB-1 of 10%.  METASTATIC DISEASE: CT scan 03/18/2014 shows multiple sclerotic bone mets but no visceral disease  (1) no bone biopsy obtained to this point  (b) CA 27-29 is informative  (3) anastrozole started January 2016  (a)  palbociclib added 11/21/2014 at 125 mg daily, 21/7  (b) Palbociclib dose decreased to 100 mg daily, 21/ 7, with cycle 2, starting 12/21/2014  (4) vitamin D deficiency: On replacement  (5) status post right lumpectomy 02/17/2015 for a pT2 pNX invasive ductal carcinoma, grade 1, estrogen receptor 100% positive, progesterone receptor 20% positive, HER-2 not amplified. Margins were negative  (6) denosumab/ Xgeva started 12/15/2014, repeated monthly  (7) genetics testing offered. Patient still deciding  (8) radiation therapy to lumpectomy site declined by patient  PLAN: Oliver continues to tolerate the anastrozole and palbociclib remarkably well. She is a little bit off cycle at present because of a trip, but she will start her next cycle May 1  and then the next one will be May 30 since May 29 is a holiday..  She tells me she did not have the bone density done because she wasn't sure what it was for. We discussed that at length today. She understands this will serve as a baseline. I don't expect it to change current treatment, since she is doing so well. I have gone ahead and scheduled for mid May which is when she tells me she will have a little bit more free time.  She will receive her denosumab/Xgeva today, of course, and then the next treatment will be May 29. That will allow [place] to coordinate the injections with the lab work for the [place].  I don't have a simple solution for the skin peeling she tells me she experiences on her off week. Keeping her hands lubricated is a good idea. Also she has chronic insomnia problems which are not going to be related to her current medications.  After the May 29 visit I will probably start seeing her on an every three-month basis. She knows to call for any problems that may develop before then.  Chauncey Cruel, MD

## 2015-06-19 NOTE — Patient Instructions (Signed)
Denosumab injection  What is this medicine?  DENOSUMAB (den oh sue mab) slows bone breakdown. Prolia is used to treat osteoporosis in women after menopause and in men. Xgeva is used to prevent bone fractures and other bone problems caused by cancer bone metastases. Xgeva is also used to treat giant cell tumor of the bone.  This medicine may be used for other purposes; ask your health care provider or pharmacist if you have questions.  What should I tell my health care provider before I take this medicine?  They need to know if you have any of these conditions:  -dental disease  -eczema  -infection or history of infections  -kidney disease or on dialysis  -low blood calcium or vitamin D  -malabsorption syndrome  -scheduled to have surgery or tooth extraction  -taking medicine that contains denosumab  -thyroid or parathyroid disease  -an unusual reaction to denosumab, other medicines, foods, dyes, or preservatives  -pregnant or trying to get pregnant  -breast-feeding  How should I use this medicine?  This medicine is for injection under the skin. It is given by a health care professional in a hospital or clinic setting.  If you are getting Prolia, a special MedGuide will be given to you by the pharmacist with each prescription and refill. Be sure to read this information carefully each time.  For Prolia, talk to your pediatrician regarding the use of this medicine in children. Special care may be needed. For Xgeva, talk to your pediatrician regarding the use of this medicine in children. While this drug may be prescribed for children as young as 13 years for selected conditions, precautions do apply.  Overdosage: If you think you have taken too much of this medicine contact a poison control center or emergency room at once.  NOTE: This medicine is only for you. Do not share this medicine with others.  What if I miss a dose?  It is important not to miss your dose. Call your doctor or health care professional if you are  unable to keep an appointment.  What may interact with this medicine?  Do not take this medicine with any of the following medications:  -other medicines containing denosumab  This medicine may also interact with the following medications:  -medicines that suppress the immune system  -medicines that treat cancer  -steroid medicines like prednisone or cortisone  This list may not describe all possible interactions. Give your health care provider a list of all the medicines, herbs, non-prescription drugs, or dietary supplements you use. Also tell them if you smoke, drink alcohol, or use illegal drugs. Some items may interact with your medicine.  What should I watch for while using this medicine?  Visit your doctor or health care professional for regular checks on your progress. Your doctor or health care professional may order blood tests and other tests to see how you are doing.  Call your doctor or health care professional if you get a cold or other infection while receiving this medicine. Do not treat yourself. This medicine may decrease your body's ability to fight infection.  You should make sure you get enough calcium and vitamin D while you are taking this medicine, unless your doctor tells you not to. Discuss the foods you eat and the vitamins you take with your health care professional.  See your dentist regularly. Brush and floss your teeth as directed. Before you have any dental work done, tell your dentist you are receiving this medicine.  Do   not become pregnant while taking this medicine or for 5 months after stopping it. Women should inform their doctor if they wish to become pregnant or think they might be pregnant. There is a potential for serious side effects to an unborn child. Talk to your health care professional or pharmacist for more information.  What side effects may I notice from receiving this medicine?  Side effects that you should report to your doctor or health care professional as soon as  possible:  -allergic reactions like skin rash, itching or hives, swelling of the face, lips, or tongue  -breathing problems  -chest pain  -fast, irregular heartbeat  -feeling faint or lightheaded, falls  -fever, chills, or any other sign of infection  -muscle spasms, tightening, or twitches  -numbness or tingling  -skin blisters or bumps, or is dry, peels, or red  -slow healing or unexplained pain in the mouth or jaw  -unusual bleeding or bruising  Side effects that usually do not require medical attention (Report these to your doctor or health care professional if they continue or are bothersome.):  -muscle pain  -stomach upset, gas  This list may not describe all possible side effects. Call your doctor for medical advice about side effects. You may report side effects to FDA at 1-800-FDA-1088.  Where should I keep my medicine?  This medicine is only given in a clinic, doctor's office, or other health care setting and will not be stored at home.  NOTE: This sheet is a summary. It may not cover all possible information. If you have questions about this medicine, talk to your doctor, pharmacist, or health care provider.      2016, [employer] Standard. (2011-08-19 12:37:47)

## 2015-06-20 LAB — CANCER ANTIGEN 27.29: CA 27.29: 88.2 U/mL — ABNORMAL HIGH (ref 0.0–38.6)

## 2015-06-28 ENCOUNTER — Other Ambulatory Visit: Payer: Self-pay | Admitting: Nurse Practitioner

## 2015-06-29 ENCOUNTER — Telehealth: Payer: Self-pay | Admitting: *Deleted

## 2015-06-29 NOTE — Telephone Encounter (Signed)
Called pt and talked to her about bone scan. I told her Morey Hummingbird from radiology called trying to get her to schedule appt and wanted to know if she was ready to schedule procedure. Pt told me she had communicated with Dr. Jana Hakim and told him she could not have it done until June b/c of work. Pt told me she will call scheduling on tomorrow to r/s this appt. I gave her the number.Pt will see Dr. Jana Hakim on 08/01/15.

## 2015-07-07 ENCOUNTER — Encounter: Payer: Self-pay | Admitting: Oncology

## 2015-07-07 NOTE — Progress Notes (Signed)
Fax sent to 02/23/15 I sent to medical records

## 2015-07-17 ENCOUNTER — Other Ambulatory Visit: Payer: BC Managed Care – PPO

## 2015-07-17 ENCOUNTER — Ambulatory Visit: Payer: BC Managed Care – PPO

## 2015-07-21 ENCOUNTER — Encounter (HOSPITAL_COMMUNITY)
Admission: RE | Admit: 2015-07-21 | Discharge: 2015-07-21 | Disposition: A | Discharge status: Home / Self Care | Payer: BC Managed Care – PPO | Source: Ambulatory Visit | Attending: Nurse Practitioner | Admitting: Nurse Practitioner

## 2015-07-21 DIAGNOSIS — C50411 Malignant neoplasm of upper-outer quadrant of right female breast: Secondary | ICD-10-CM | POA: Diagnosis present

## 2015-07-21 MED ORDER — TECHNETIUM TC 99M MEDRONATE IV KIT
26.3000 | PACK | Freq: Once | INTRAVENOUS | Status: AC | PRN
  Administered 2015-07-21: 26.3 via INTRAVENOUS

## 2015-08-01 ENCOUNTER — Ambulatory Visit (HOSPITAL_BASED_OUTPATIENT_CLINIC_OR_DEPARTMENT_OTHER): Payer: BC Managed Care – PPO | Admitting: Oncology

## 2015-08-01 ENCOUNTER — Telehealth: Payer: Self-pay | Admitting: Oncology

## 2015-08-01 ENCOUNTER — Ambulatory Visit (HOSPITAL_BASED_OUTPATIENT_CLINIC_OR_DEPARTMENT_OTHER): Payer: BC Managed Care – PPO

## 2015-08-01 ENCOUNTER — Other Ambulatory Visit (HOSPITAL_BASED_OUTPATIENT_CLINIC_OR_DEPARTMENT_OTHER): Payer: BC Managed Care – PPO

## 2015-08-01 VITALS — BP 142/98 | HR 89 | Temp 98.4°F | Resp 18 | Ht 67.0 in | Wt 365.3 lb

## 2015-08-01 DIAGNOSIS — C773 Secondary and unspecified malignant neoplasm of axilla and upper limb lymph nodes: Secondary | ICD-10-CM

## 2015-08-01 DIAGNOSIS — C50411 Malignant neoplasm of upper-outer quadrant of right female breast: Secondary | ICD-10-CM | POA: Diagnosis not present

## 2015-08-01 DIAGNOSIS — C50919 Malignant neoplasm of unspecified site of unspecified female breast: Secondary | ICD-10-CM

## 2015-08-01 DIAGNOSIS — C50911 Malignant neoplasm of unspecified site of right female breast: Secondary | ICD-10-CM

## 2015-08-01 DIAGNOSIS — Z170 Estrogen receptor positive status [ER+]: Secondary | ICD-10-CM

## 2015-08-01 DIAGNOSIS — C7951 Secondary malignant neoplasm of bone: Secondary | ICD-10-CM

## 2015-08-01 DIAGNOSIS — E559 Vitamin D deficiency, unspecified: Secondary | ICD-10-CM | POA: Diagnosis not present

## 2015-08-01 LAB — COMPREHENSIVE METABOLIC PANEL
ALT: 12 U/L (ref 0–55)
AST: 11 U/L (ref 5–34)
Albumin: 3.6 g/dL (ref 3.5–5.0)
Alkaline Phosphatase: 64 U/L (ref 40–150)
Anion Gap: 9 mEq/L (ref 3–11)
BUN: 17.3 mg/dL (ref 7.0–26.0)
CHLORIDE: 106 meq/L (ref 98–109)
CO2: 28 meq/L (ref 22–29)
CREATININE: 1.1 mg/dL (ref 0.6–1.1)
Calcium: 9.3 mg/dL (ref 8.4–10.4)
EGFR: 66 mL/min/{1.73_m2} — ABNORMAL LOW (ref >90)
Glucose: 100 mg/dl (ref 70–140)
Potassium: 4 mEq/L (ref 3.5–5.1)
Sodium: 142 mEq/L (ref 136–145)
Total Bilirubin: 0.7 mg/dL (ref 0.20–1.20)
Total Protein: 7.3 g/dL (ref 6.4–8.3)

## 2015-08-01 LAB — CBC WITH DIFFERENTIAL/PLATELET
BASO%: 0.4 % (ref 0.0–2.0)
Basophils Absolute: 0 10*3/uL (ref 0.0–0.1)
EOS%: 3 % (ref 0.0–7.0)
Eosinophils Absolute: 0.2 10*3/uL (ref 0.0–0.5)
HEMATOCRIT: 39.2 % (ref 34.8–46.6)
HGB: 13 g/dL (ref 11.6–15.9)
LYMPH#: 1.9 10*3/uL (ref 0.9–3.3)
LYMPH%: 35.8 % (ref 14.0–49.7)
MCH: 32.5 pg (ref 25.1–34.0)
MCHC: 33.1 g/dL (ref 31.5–36.0)
MCV: 97.9 fL (ref 79.5–101.0)
MONO#: 0.4 10*3/uL (ref 0.1–0.9)
MONO%: 6.6 % (ref 0.0–14.0)
NEUT%: 54.2 % (ref 38.4–76.8)
NEUTROS ABS: 2.9 10*3/uL (ref 1.5–6.5)
Platelets: 230 10*3/uL (ref 145–400)
RBC: 4.01 10*6/uL (ref 3.70–5.45)
RDW: 15.6 % — ABNORMAL HIGH (ref 11.2–14.5)
WBC: 5.4 10*3/uL (ref 3.9–10.3)

## 2015-08-01 MED ORDER — DENOSUMAB 120 MG/1.7ML ~~LOC~~ SOLN
120.0000 mg | Freq: Once | SUBCUTANEOUS | Status: AC
  Administered 2015-08-01: 120 mg via SUBCUTANEOUS
  Filled 2015-08-01: qty 1.7

## 2015-08-01 MED ORDER — AMLODIPINE-OLMESARTAN 10-40 MG PO TABS: 1.0000 | ORAL_TABLET | Freq: Every day | ORAL | Status: DC

## 2015-08-01 NOTE — Progress Notes (Signed)
Visit  [place]  [phone] Fax:([phone]) [phone]   ID: Danielle Oliver DOB: Dec 26, 1960  MR#: [id number]  VWP#:[id number]  Patient Care Team: Danielle Rima, MD as PCP - General (Family Medicine) Danielle Silversmith, MD as Consulting Physician (Radiation Oncology) Danielle Bouche, NP as Nurse Practitioner (Nurse Practitioner) Danielle Cruel, MD as Consulting Physician (Oncology) Danielle Overall, MD as Consulting Physician (General Surgery) PCP: Danielle Rima, MD GYN: OTHER MD: Danielle Heck MD  CHIEF COMPLAINT: Stage IV breast cancer  CURRENT TREATMENT: Anastrozole, palbociclib, denosumab   BREAST CANCER HISTORY: From Danielle Oliver [facility] 03/11/2014 intake note:  "She was diagnosed with stage III left breast cancer in 1991 when she was 55 years old. Unfortunately her breast cancer diagnosis and treatment history was not available for review today. Apparently she had left mastectomy, and 4 cycles of adjuvant chemotherapy under Danielle. Mariana Oliver care at our cancer center. Patient does not recall the name of the chemotherapy medication. She was also offered a clinical trial of bone marrow transplant for her breast cancer and she declined. She was followed by January mammograms and had no evidence of recurrence. Her last screening mammogram was in 2009 which was benign.  She noticed a lump in her right breast about 20 weeks ago. It was hard and the tender on palpitation. She denies any skin change or nipple discharge. She has no appetite change, weight loss, or other new symptoms. She had underwent a diagnostic mammogram on 01/12/2014, which showed a 3.6 cm lobulated solid mass in right upper quadrant of right breast, and an oval axillary lymph nodes was cortical thickening. 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 She continues on palbociclib and anastrozole. She tolerates the palbociclib with no side effects that she is aware of and currently she obtains it at no cost.she does get some aches and pains occasionally from the anastrozole, but she rarely takes ibuprofen to deal with it. She has no problems from the denosumab.  Since her last visit here we obtained a repeat bone scan which shows no activity. This is very favorable.  REVIEW OF SYSTEMS: Danielle Oliver to work full-time and does fine her work stressful. Her 70 year old daughter is getting all Lasix and school. Aside fromaches and pains just mentioned, a detailed review of systems today was entirely stable  PAST MEDICAL HISTORY: Past Medical History  Diagnosis Date  . Breast cancer ([place])   . Hypertension   . Gout   . Arthritis   . Morbid obesity with body mass index of 50.0-59.9 in adult [facility])   . Dysrhythmia   . Heart murmur     one Danielle told her, "nothing to worry about"  . Shortness of breath dyspnea     With exertion  . Pneumonia 2013 ish    PAST SURGICAL HISTORY: Past Surgical History  Procedure Laterality Date  . Mastectomy Left 1992  . Tonsillectomy    . Fracture surgery Right   . Colonoscopy    . Breast lumpectomy with radioactive seed localization Right 02/17/2015    Procedure: RIGHT BREAST LUMPECTOMY WITH RADIOACTIVE SEED LOCALIZATION;  Surgeon: Danielle Overall, MD;  Location: [place];  Service: General;  Laterality: Right;  FAMILY HISTORY Family History  Problem Relation Age of Onset  . Colon cancer Father    the patient's father died at 26 from colon cancer which was diagnosed shortly before his death. The patient's mother is living, age 37 as of July 2016. The patient is an only child. The only other cancer in the family to  her knowledge is a paternal great aunt who developed breast cancer in her late 84s. There is no history of ovarian cancer and no other history of colon cancer in the family to her knowledge  GYNECOLOGIC HISTORY:  No LMP recorded. Patient is postmenopausal. menarche age 13 first live birth age 41 the patient is [place] P3. She stopped having periods in 2013. She did not take hormone replacement.   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 She is divorced. At home is just she and her son Danielle Oliver, 84 years old. Son Danielle Oliver, 27, lives in [name] where he works as a Fish farm manager. Son Danielle Oliver, 25, lives in [place] and is working on a Oceanographer in counseling at [employer]. The patient has one granddaughter, Danielle Oliver, in [name]. She is not a church attender    ADVANCED DIRECTIVES: Not in place   HEALTH MAINTENANCE: Social History  Substance Use Topics  . Smoking status: Never Smoker   . Smokeless tobacco: Not on file  . Alcohol Use: No     Comment: rare     Colonoscopy: remote/ Danielle Oliver  PAP: 2014  Bone density:  Lipid panel:  Allergies  Allergen Reactions  . Iodinated Diagnostic Agents Anaphylaxis  . Penicillins Rash    Has patient had a PCN reaction causing immediate rash, facial/tongue/throat swelling, SOB or lightheadedness with hypotension: Yes  Has patient had a PCN reaction causing severe rash involving mucus membranes or skin necrosis: No Has patient had a PCN reaction that required hospitalization No Has patient had a PCN reaction occurring within the last 10 years: Yes If all of the above answers are "NO", then may proceed with Cephalosporin use.    Current Outpatient Prescriptions  Medication Sig Dispense Refill  . amLODipine-olmesartan (AZOR) 10-40 MG tablet Take 1 tablet by mouth daily. 90 tablet 4  . anastrozole (ARIMIDEX) 1 MG tablet Take 1 tablet (1 mg total) by mouth daily. 90 tablet 3  . Calcium Carb-Cholecalciferol (CALTRATE 600+D)  600-800 MG-UNIT TABS Take by mouth 2 (two) times daily. 60 tablet   . ibuprofen (ADVIL,MOTRIN) 200 MG tablet Take 200-400 mg by mouth daily as needed. Reported on 05/22/2015    . lovastatin (MEVACOR) 40 MG tablet Take 40 mg by mouth daily.    . palbociclib (IBRANCE) 100 MG capsule Take 1 capsule (100 mg total) by mouth daily with breakfast. Take whole with food for 21 days, rest 7 days and repeat. 21 capsule 5   No current facility-administered medications for this visit.    OBJECTIVE: Middle-aged African-American woman n no acute distress Filed Vitals:   08/01/15 0927  BP: 142/98  Pulse: 89  Temp: 98.4 F (36.9 C)  Resp: 18     Body mass index is 57.2 kg/(m^2).    ECOG FS:1 - Symptomatic but completely ambulatory  Sclerae unicteric, EOMs intact Oropharynx clear, dentition in good repair No cervical or supraclavicular adenopathy Lungs no rales or rhonchi Heart regular rate and rhythm Abd soft, obese, nontender, positive bowel sounds MSK no focal spinal tenderness, no upper extremity lymphedema Neuro: nonfocal, well oriented, appropriate affect Breasts: deferred  LAB RESULTS:  CMP     Component Value Date/Time   NA 142 08/01/2015 0847   NA 143 02/16/2015 1152   K 4.0 08/01/2015 0847   K 4.1 02/16/2015 1152   CL 110 02/16/2015 1152   CO2 28 08/01/2015 0847   CO2 26 02/16/2015 1152   GLUCOSE 100 08/01/2015 0847   GLUCOSE 112* 02/16/2015 1152   BUN 17.3 08/01/2015 0847   BUN 11 02/16/2015 1152   CREATININE 1.1 08/01/2015 0847   CREATININE 0.81 02/16/2015 1152   CALCIUM 9.3 08/01/2015 0847   CALCIUM 8.9 02/16/2015 1152   PROT 7.3 08/01/2015 0847   ALBUMIN 3.6 08/01/2015 0847   AST 11 08/01/2015 0847   ALT 12 08/01/2015 0847   ALKPHOS 64 08/01/2015 0847   BILITOT 0.70 08/01/2015 0847   GFRNONAA >60 02/16/2015 1152   GFRAA >60 02/16/2015 1152     INo results found for: SPEP, UPEP  Lab Results  Component Value Date   WBC 5.4 08/01/2015   NEUTROABS 2.9 08/01/2015    HGB 13.0 08/01/2015   HCT 39.2 08/01/2015   MCV 97.9 08/01/2015   PLT 230 08/01/2015      Chemistry      Component Value Date/Time   NA 142 08/01/2015 0847   NA 143 02/16/2015 1152   K 4.0 08/01/2015 0847   K 4.1 02/16/2015 1152   CL 110 02/16/2015 1152   CO2 28 08/01/2015 0847   CO2 26 02/16/2015 1152   BUN 17.3 08/01/2015 0847   BUN 11 02/16/2015 1152   CREATININE 1.1 08/01/2015 0847   CREATININE 0.81 02/16/2015 1152      Component Value Date/Time   CALCIUM 9.3 08/01/2015 0847   CALCIUM 8.9 02/16/2015 1152   ALKPHOS 64 08/01/2015 0847   AST 11 08/01/2015 0847   ALT 12 08/01/2015 0847   BILITOT 0.70 08/01/2015 0847       Lab Results  Component Value Date   LABCA2 85* 05/22/2015    No components found for: [id number]  No results for input(s): INR in the last 168 hours.  Urinalysis    Component Value Date/Time   LABSPEC 1.015 04/27/2007 1654   PHURINE 6.0 04/27/2007 1654   GLUCOSEU NEGATIVE 04/27/2007 1654   HGBUR NEGATIVE 04/27/2007 1654   BILIRUBINUR NEGATIVE 04/27/2007 1654   KETONESUR NEGATIVE 04/27/2007 1654   PROTEINUR NEGATIVE 04/27/2007 1654   UROBILINOGEN 0.2 04/27/2007 1654   NITRITE NEGATIVE 04/27/2007 1654   LEUKOCYTESUR  04/27/2007 1654    NEGATIVE Biochemical Testing Only. Please order routine urinalysis from main lab if confirmatory testing is needed.   STUDIES: Nm Bone Scan Whole Body  07/21/2015  CLINICAL DATA:  History of metastatic breast malignancy EXAM: NUCLEAR MEDICINE WHOLE BODY BONE SCAN TECHNIQUE: Whole body anterior and posterior images were obtained approximately 3 hours after intravenous injection of radiopharmaceutical. RADIOPHARMACEUTICALS:  26.3 mCi Technetium-36mMDP IV COMPARISON:  Nuclear bone scan of October 20, 2014 FINDINGS: There is adequate uptake of the radiopharmaceutical by the skeleton. Adequate soft tissue clearance and renal activity is observed. Previously demonstrated abnormal uptake in the calvarium is not  evident today. The midshaft right humeral focus of increased uptake is not visualized either. Previously demonstrated increased uptake in the medial aspect of the left iliac bone is not evident today. Elsewhere uptake within the spine is normal. Mildly increased uptake associated with the hind feet and mid feet is consistent with degenerative change. IMPRESSION: There is no abnormal uptake demonstrated today to suggest metastatic disease. Electronically Signed  By: David  Martinique M.D.   On: 07/21/2015 12:33    ASSESSMENT: 55 y.o. [place] woman  (1) status post left mastectomy in 1991 followed by adjuvant chemotherapy Consisting of doxorubicin and cyclophosphamide given every 21 days 4 (records under "media")  (2) status post right breast biopsy 01/13/2014 for a  cT2 p N1, stage IIB invasive ductal carcinoma, estrogen and progesterone receptor positive, HER-2 negative, with an MIB-1 of 10%.  METASTATIC DISEASE: CT scan 03/18/2014 shows multiple sclerotic bone mets but no visceral disease  (1) no bone biopsy obtained to this point  (b) CA 27-29 is informative  (3) anastrozole started January 2016  (a)  palbociclib added 11/21/2014 at 125 mg daily, 21/7  (b) Palbociclib dose decreased to 100 mg daily, 21/ 7, with cycle 2, starting 12/21/2014  (4) vitamin D deficiency: On replacement  (5) status post right lumpectomy 02/17/2015 for a pT2 pNX invasive ductal carcinoma, grade 1, estrogen receptor 100% positive, progesterone receptor 20% positive, HER-2 not amplified. Margins were negative  (6) denosumab/ Xgeva started 12/15/2014, repeated monthly  (7) genetics testing offered. Patient still deciding  (8) radiation therapy to lumpectomy site declined by patient  PLAN: Ysabel he is having a very good response to her treatment and she is tolerating the treatment well. I am delighted at how well she is doing.  She understands that stage IV breast cancer is not curable but she certainly seems  to be in remission at this point.  There has been some irregularity in her compliance She is going to be treated today and her next treatment will be June 30, after which it will be every 4 weeks. She says Fridays R her best days.  I'm going to see her again in 4 months.We will obtain a CT scan of the chest prior to that visit.She understands I expect to see sclerotic lesions but no new lesions on the chest CT.  At this point I am delighted that she is doing so well She knows to call for any problems that may develop before her next visit here.  Danielle Cruel, MD

## 2015-08-01 NOTE — Patient Instructions (Signed)
Denosumab injection  What is this medicine?  DENOSUMAB (den oh sue mab) slows bone breakdown. Prolia is used to treat osteoporosis in women after menopause and in men. Xgeva is used to prevent bone fractures and other bone problems caused by cancer bone metastases. Xgeva is also used to treat giant cell tumor of the bone.  This medicine may be used for other purposes; ask your health care provider or pharmacist if you have questions.  What should I tell my health care provider before I take this medicine?  They need to know if you have any of these conditions:  -dental disease  -eczema  -infection or history of infections  -kidney disease or on dialysis  -low blood calcium or vitamin D  -malabsorption syndrome  -scheduled to have surgery or tooth extraction  -taking medicine that contains denosumab  -thyroid or parathyroid disease  -an unusual reaction to denosumab, other medicines, foods, dyes, or preservatives  -pregnant or trying to get pregnant  -breast-feeding  How should I use this medicine?  This medicine is for injection under the skin. It is given by a health care professional in a hospital or clinic setting.  If you are getting Prolia, a special MedGuide will be given to you by the pharmacist with each prescription and refill. Be sure to read this information carefully each time.  For Prolia, talk to your pediatrician regarding the use of this medicine in children. Special care may be needed. For Xgeva, talk to your pediatrician regarding the use of this medicine in children. While this drug may be prescribed for children as young as 13 years for selected conditions, precautions do apply.  Overdosage: If you think you have taken too much of this medicine contact a poison control center or emergency room at once.  NOTE: This medicine is only for you. Do not share this medicine with others.  What if I miss a dose?  It is important not to miss your dose. Call your doctor or health care professional if you are  unable to keep an appointment.  What may interact with this medicine?  Do not take this medicine with any of the following medications:  -other medicines containing denosumab  This medicine may also interact with the following medications:  -medicines that suppress the immune system  -medicines that treat cancer  -steroid medicines like prednisone or cortisone  This list may not describe all possible interactions. Give your health care provider a list of all the medicines, herbs, non-prescription drugs, or dietary supplements you use. Also tell them if you smoke, drink alcohol, or use illegal drugs. Some items may interact with your medicine.  What should I watch for while using this medicine?  Visit your doctor or health care professional for regular checks on your progress. Your doctor or health care professional may order blood tests and other tests to see how you are doing.  Call your doctor or health care professional if you get a cold or other infection while receiving this medicine. Do not treat yourself. This medicine may decrease your body's ability to fight infection.  You should make sure you get enough calcium and vitamin D while you are taking this medicine, unless your doctor tells you not to. Discuss the foods you eat and the vitamins you take with your health care professional.  See your dentist regularly. Brush and floss your teeth as directed. Before you have any dental work done, tell your dentist you are receiving this medicine.  Do   not become pregnant while taking this medicine or for 5 months after stopping it. Women should inform their doctor if they wish to become pregnant or think they might be pregnant. There is a potential for serious side effects to an unborn child. Talk to your health care professional or pharmacist for more information.  What side effects may I notice from receiving this medicine?  Side effects that you should report to your doctor or health care professional as soon as  possible:  -allergic reactions like skin rash, itching or hives, swelling of the face, lips, or tongue  -breathing problems  -chest pain  -fast, irregular heartbeat  -feeling faint or lightheaded, falls  -fever, chills, or any other sign of infection  -muscle spasms, tightening, or twitches  -numbness or tingling  -skin blisters or bumps, or is dry, peels, or red  -slow healing or unexplained pain in the mouth or jaw  -unusual bleeding or bruising  Side effects that usually do not require medical attention (Report these to your doctor or health care professional if they continue or are bothersome.):  -muscle pain  -stomach upset, gas  This list may not describe all possible side effects. Call your doctor for medical advice about side effects. You may report side effects to FDA at 1-800-FDA-1088.  Where should I keep my medicine?  This medicine is only given in a clinic, doctor's office, or other health care setting and will not be stored at home.  NOTE: This sheet is a summary. It may not cover all possible information. If you have questions about this medicine, talk to your doctor, pharmacist, or health care provider.      2016, [employer] Standard. (2011-08-19 12:37:47)

## 2015-08-01 NOTE — Telephone Encounter (Signed)
appt made and avs printed °

## 2015-08-02 ENCOUNTER — Other Ambulatory Visit: Payer: Self-pay | Admitting: *Deleted

## 2015-08-02 ENCOUNTER — Ambulatory Visit: Payer: BC Managed Care – PPO | Admitting: Oncology

## 2015-08-02 DIAGNOSIS — C7951 Secondary malignant neoplasm of bone: Principal | ICD-10-CM

## 2015-08-02 DIAGNOSIS — C50919 Malignant neoplasm of unspecified site of unspecified female breast: Secondary | ICD-10-CM

## 2015-08-02 LAB — CANCER ANTIGEN 27.29: CAN 27.29: 121.2 U/mL — AB (ref 0.0–38.6)

## 2015-08-02 MED ORDER — PALBOCICLIB 100 MG PO CAPS: 100.0000 mg | ORAL_CAPSULE | Freq: Every day | ORAL | Status: DC

## 2015-08-14 ENCOUNTER — Other Ambulatory Visit: Payer: BC Managed Care – PPO

## 2015-08-14 ENCOUNTER — Ambulatory Visit: Payer: BC Managed Care – PPO

## 2015-09-01 ENCOUNTER — Ambulatory Visit (HOSPITAL_BASED_OUTPATIENT_CLINIC_OR_DEPARTMENT_OTHER): Payer: BC Managed Care – PPO

## 2015-09-01 ENCOUNTER — Other Ambulatory Visit (HOSPITAL_BASED_OUTPATIENT_CLINIC_OR_DEPARTMENT_OTHER): Payer: BC Managed Care – PPO

## 2015-09-01 VITALS — BP 180/85 | HR 100 | Temp 98.2°F | Resp 20

## 2015-09-01 DIAGNOSIS — C773 Secondary and unspecified malignant neoplasm of axilla and upper limb lymph nodes: Secondary | ICD-10-CM | POA: Diagnosis not present

## 2015-09-01 DIAGNOSIS — C50411 Malignant neoplasm of upper-outer quadrant of right female breast: Secondary | ICD-10-CM | POA: Diagnosis not present

## 2015-09-01 DIAGNOSIS — C7951 Secondary malignant neoplasm of bone: Secondary | ICD-10-CM

## 2015-09-01 DIAGNOSIS — C50919 Malignant neoplasm of unspecified site of unspecified female breast: Secondary | ICD-10-CM

## 2015-09-01 LAB — COMPREHENSIVE METABOLIC PANEL
ALBUMIN: 3.5 g/dL (ref 3.5–5.0)
ALK PHOS: 79 U/L (ref 40–150)
ALT: 14 U/L (ref 0–55)
AST: 13 U/L (ref 5–34)
Anion Gap: 9 mEq/L (ref 3–11)
BUN: 13 mg/dL (ref 7.0–26.0)
CHLORIDE: 106 meq/L (ref 98–109)
CO2: 28 mEq/L (ref 22–29)
CREATININE: 1.2 mg/dL — AB (ref 0.6–1.1)
Calcium: 9.6 mg/dL (ref 8.4–10.4)
EGFR: 60 mL/min/{1.73_m2} — ABNORMAL LOW (ref >90)
GLUCOSE: 99 mg/dL (ref 70–140)
POTASSIUM: 3.9 meq/L (ref 3.5–5.1)
SODIUM: 143 meq/L (ref 136–145)
Total Bilirubin: 0.58 mg/dL (ref 0.20–1.20)
Total Protein: 7.5 g/dL (ref 6.4–8.3)

## 2015-09-01 LAB — CBC WITH DIFFERENTIAL/PLATELET
BASO%: 2.5 % — AB (ref 0.0–2.0)
BASOS ABS: 0.1 10*3/uL (ref 0.0–0.1)
EOS%: 2.2 % (ref 0.0–7.0)
Eosinophils Absolute: 0.1 10*3/uL (ref 0.0–0.5)
HCT: 38.7 % (ref 34.8–46.6)
HEMOGLOBIN: 13 g/dL (ref 11.6–15.9)
LYMPH#: 1.7 10*3/uL (ref 0.9–3.3)
LYMPH%: 31.5 % (ref 14.0–49.7)
MCH: 32.8 pg (ref 25.1–34.0)
MCHC: 33.5 g/dL (ref 31.5–36.0)
MCV: 97.8 fL (ref 79.5–101.0)
MONO#: 0.4 10*3/uL (ref 0.1–0.9)
MONO%: 6.9 % (ref 0.0–14.0)
NEUT#: 3 10*3/uL (ref 1.5–6.5)
NEUT%: 56.9 % (ref 38.4–76.8)
Platelets: 271 10*3/uL (ref 145–400)
RBC: 3.95 10*6/uL (ref 3.70–5.45)
RDW: 15.7 % — AB (ref 11.2–14.5)
WBC: 5.3 10*3/uL (ref 3.9–10.3)

## 2015-09-01 MED ORDER — DENOSUMAB 120 MG/1.7ML ~~LOC~~ SOLN
120.0000 mg | Freq: Once | SUBCUTANEOUS | Status: AC
  Administered 2015-09-01: 120 mg via SUBCUTANEOUS
  Filled 2015-09-01: qty 1.7

## 2015-09-01 NOTE — Patient Instructions (Signed)
Denosumab injection  What is this medicine?  DENOSUMAB (den oh sue mab) slows bone breakdown. Prolia is used to treat osteoporosis in women after menopause and in men. Xgeva is used to prevent bone fractures and other bone problems caused by cancer bone metastases. Xgeva is also used to treat giant cell tumor of the bone.  This medicine may be used for other purposes; ask your health care provider or pharmacist if you have questions.  What should I tell my health care provider before I take this medicine?  They need to know if you have any of these conditions:  -dental disease  -eczema  -infection or history of infections  -kidney disease or on dialysis  -low blood calcium or vitamin D  -malabsorption syndrome  -scheduled to have surgery or tooth extraction  -taking medicine that contains denosumab  -thyroid or parathyroid disease  -an unusual reaction to denosumab, other medicines, foods, dyes, or preservatives  -pregnant or trying to get pregnant  -breast-feeding  How should I use this medicine?  This medicine is for injection under the skin. It is given by a health care professional in a hospital or clinic setting.  If you are getting Prolia, a special MedGuide will be given to you by the pharmacist with each prescription and refill. Be sure to read this information carefully each time.  For Prolia, talk to your pediatrician regarding the use of this medicine in children. Special care may be needed. For Xgeva, talk to your pediatrician regarding the use of this medicine in children. While this drug may be prescribed for children as young as 13 years for selected conditions, precautions do apply.  Overdosage: If you think you have taken too much of this medicine contact a poison control center or emergency room at once.  NOTE: This medicine is only for you. Do not share this medicine with others.  What if I miss a dose?  It is important not to miss your dose. Call your doctor or health care professional if you are  unable to keep an appointment.  What may interact with this medicine?  Do not take this medicine with any of the following medications:  -other medicines containing denosumab  This medicine may also interact with the following medications:  -medicines that suppress the immune system  -medicines that treat cancer  -steroid medicines like prednisone or cortisone  This list may not describe all possible interactions. Give your health care provider a list of all the medicines, herbs, non-prescription drugs, or dietary supplements you use. Also tell them if you smoke, drink alcohol, or use illegal drugs. Some items may interact with your medicine.  What should I watch for while using this medicine?  Visit your doctor or health care professional for regular checks on your progress. Your doctor or health care professional may order blood tests and other tests to see how you are doing.  Call your doctor or health care professional if you get a cold or other infection while receiving this medicine. Do not treat yourself. This medicine may decrease your body's ability to fight infection.  You should make sure you get enough calcium and vitamin D while you are taking this medicine, unless your doctor tells you not to. Discuss the foods you eat and the vitamins you take with your health care professional.  See your dentist regularly. Brush and floss your teeth as directed. Before you have any dental work done, tell your dentist you are receiving this medicine.  Do   not become pregnant while taking this medicine or for 5 months after stopping it. Women should inform their doctor if they wish to become pregnant or think they might be pregnant. There is a potential for serious side effects to an unborn child. Talk to your health care professional or pharmacist for more information.  What side effects may I notice from receiving this medicine?  Side effects that you should report to your doctor or health care professional as soon as  possible:  -allergic reactions like skin rash, itching or hives, swelling of the face, lips, or tongue  -breathing problems  -chest pain  -fast, irregular heartbeat  -feeling faint or lightheaded, falls  -fever, chills, or any other sign of infection  -muscle spasms, tightening, or twitches  -numbness or tingling  -skin blisters or bumps, or is dry, peels, or red  -slow healing or unexplained pain in the mouth or jaw  -unusual bleeding or bruising  Side effects that usually do not require medical attention (Report these to your doctor or health care professional if they continue or are bothersome.):  -muscle pain  -stomach upset, gas  This list may not describe all possible side effects. Call your doctor for medical advice about side effects. You may report side effects to FDA at 1-800-FDA-1088.  Where should I keep my medicine?  This medicine is only given in a clinic, doctor's office, or other health care setting and will not be stored at home.  NOTE: This sheet is a summary. It may not cover all possible information. If you have questions about this medicine, talk to your doctor, pharmacist, or health care provider.      2016, [employer] Standard. (2011-08-19 12:37:47)

## 2015-09-02 LAB — CANCER ANTIGEN 27.29: CA 27.29: 148.5 U/mL — ABNORMAL HIGH (ref 0.0–38.6)

## 2015-09-02 LAB — VITAMIN D 25 HYDROXY (VIT D DEFICIENCY, FRACTURES): Vitamin D, 25-Hydroxy: 15.9 ng/mL — ABNORMAL LOW (ref 30.0–100.0)

## 2015-09-05 ENCOUNTER — Other Ambulatory Visit: Payer: Self-pay | Admitting: Oncology

## 2015-09-05 NOTE — Progress Notes (Unsigned)
The Ca 27 is trending up. Tere has been spotty com[liance w treatment so it is difficult to decide if reatment is not effective. Will restage in 2 months as arranged previosuly

## 2015-09-11 ENCOUNTER — Other Ambulatory Visit: Payer: BC Managed Care – PPO

## 2015-09-11 ENCOUNTER — Ambulatory Visit: Payer: BC Managed Care – PPO

## 2015-09-29 ENCOUNTER — Other Ambulatory Visit: Payer: BC Managed Care – PPO

## 2015-09-29 ENCOUNTER — Ambulatory Visit: Payer: BC Managed Care – PPO

## 2015-10-03 ENCOUNTER — Ambulatory Visit (HOSPITAL_BASED_OUTPATIENT_CLINIC_OR_DEPARTMENT_OTHER): Payer: BC Managed Care – PPO

## 2015-10-03 ENCOUNTER — Other Ambulatory Visit (HOSPITAL_BASED_OUTPATIENT_CLINIC_OR_DEPARTMENT_OTHER): Payer: BC Managed Care – PPO

## 2015-10-03 ENCOUNTER — Other Ambulatory Visit: Payer: Self-pay

## 2015-10-03 VITALS — BP 152/84 | HR 99 | Temp 97.9°F | Resp 18

## 2015-10-03 DIAGNOSIS — C7951 Secondary malignant neoplasm of bone: Secondary | ICD-10-CM | POA: Diagnosis not present

## 2015-10-03 DIAGNOSIS — C773 Secondary and unspecified malignant neoplasm of axilla and upper limb lymph nodes: Secondary | ICD-10-CM

## 2015-10-03 DIAGNOSIS — C50411 Malignant neoplasm of upper-outer quadrant of right female breast: Secondary | ICD-10-CM

## 2015-10-03 DIAGNOSIS — C50911 Malignant neoplasm of unspecified site of right female breast: Secondary | ICD-10-CM

## 2015-10-03 DIAGNOSIS — C50919 Malignant neoplasm of unspecified site of unspecified female breast: Secondary | ICD-10-CM

## 2015-10-03 LAB — COMPREHENSIVE METABOLIC PANEL
ALBUMIN: 3.7 g/dL (ref 3.5–5.0)
ALK PHOS: 72 U/L (ref 40–150)
ALT: 13 U/L (ref 0–55)
ANION GAP: 10 meq/L (ref 3–11)
AST: 13 U/L (ref 5–34)
BUN: 17.7 mg/dL (ref 7.0–26.0)
CALCIUM: 9.3 mg/dL (ref 8.4–10.4)
CO2: 25 mEq/L (ref 22–29)
CREATININE: 1.2 mg/dL — AB (ref 0.6–1.1)
Chloride: 107 mEq/L (ref 98–109)
EGFR: 59 mL/min/{1.73_m2} — ABNORMAL LOW (ref >90)
Glucose: 142 mg/dl — ABNORMAL HIGH (ref 70–140)
POTASSIUM: 4.1 meq/L (ref 3.5–5.1)
Sodium: 142 mEq/L (ref 136–145)
Total Bilirubin: 0.94 mg/dL (ref 0.20–1.20)
Total Protein: 7.7 g/dL (ref 6.4–8.3)

## 2015-10-03 LAB — CBC WITH DIFFERENTIAL/PLATELET
BASO%: 0.9 % (ref 0.0–2.0)
BASOS ABS: 0 10*3/uL (ref 0.0–0.1)
EOS%: 1.9 % (ref 0.0–7.0)
Eosinophils Absolute: 0.1 10*3/uL (ref 0.0–0.5)
HEMATOCRIT: 39.5 % (ref 34.8–46.6)
HEMOGLOBIN: 13.2 g/dL (ref 11.6–15.9)
LYMPH#: 1.2 10*3/uL (ref 0.9–3.3)
LYMPH%: 24.9 % (ref 14.0–49.7)
MCH: 32.8 pg (ref 25.1–34.0)
MCHC: 33.4 g/dL (ref 31.5–36.0)
MCV: 98.3 fL (ref 79.5–101.0)
MONO#: 0.3 10*3/uL (ref 0.1–0.9)
MONO%: 6 % (ref 0.0–14.0)
NEUT#: 3.2 10*3/uL (ref 1.5–6.5)
NEUT%: 66.3 % (ref 38.4–76.8)
PLATELETS: 259 10*3/uL (ref 145–400)
RBC: 4.02 10*6/uL (ref 3.70–5.45)
RDW: 15.4 % — AB (ref 11.2–14.5)
WBC: 4.9 10*3/uL (ref 3.9–10.3)

## 2015-10-03 MED ORDER — DENOSUMAB 120 MG/1.7ML ~~LOC~~ SOLN
120.0000 mg | Freq: Once | SUBCUTANEOUS | Status: AC
  Administered 2015-10-03: 120 mg via SUBCUTANEOUS
  Filled 2015-10-03: qty 1.7

## 2015-10-03 NOTE — Patient Instructions (Signed)
Denosumab injection  What is this medicine?  DENOSUMAB (den oh sue mab) slows bone breakdown. Prolia is used to treat osteoporosis in women after menopause and in men. Xgeva is used to prevent bone fractures and other bone problems caused by cancer bone metastases. Xgeva is also used to treat giant cell tumor of the bone.  This medicine may be used for other purposes; ask your health care provider or pharmacist if you have questions.  What should I tell my health care provider before I take this medicine?  They need to know if you have any of these conditions:  -dental disease  -eczema  -infection or history of infections  -kidney disease or on dialysis  -low blood calcium or vitamin D  -malabsorption syndrome  -scheduled to have surgery or tooth extraction  -taking medicine that contains denosumab  -thyroid or parathyroid disease  -an unusual reaction to denosumab, other medicines, foods, dyes, or preservatives  -pregnant or trying to get pregnant  -breast-feeding  How should I use this medicine?  This medicine is for injection under the skin. It is given by a health care professional in a hospital or clinic setting.  If you are getting Prolia, a special MedGuide will be given to you by the pharmacist with each prescription and refill. Be sure to read this information carefully each time.  For Prolia, talk to your pediatrician regarding the use of this medicine in children. Special care may be needed. For Xgeva, talk to your pediatrician regarding the use of this medicine in children. While this drug may be prescribed for children as young as 13 years for selected conditions, precautions do apply.  Overdosage: If you think you have taken too much of this medicine contact a poison control center or emergency room at once.  NOTE: This medicine is only for you. Do not share this medicine with others.  What if I miss a dose?  It is important not to miss your dose. Call your doctor or health care professional if you are  unable to keep an appointment.  What may interact with this medicine?  Do not take this medicine with any of the following medications:  -other medicines containing denosumab  This medicine may also interact with the following medications:  -medicines that suppress the immune system  -medicines that treat cancer  -steroid medicines like prednisone or cortisone  This list may not describe all possible interactions. Give your health care provider a list of all the medicines, herbs, non-prescription drugs, or dietary supplements you use. Also tell them if you smoke, drink alcohol, or use illegal drugs. Some items may interact with your medicine.  What should I watch for while using this medicine?  Visit your doctor or health care professional for regular checks on your progress. Your doctor or health care professional may order blood tests and other tests to see how you are doing.  Call your doctor or health care professional if you get a cold or other infection while receiving this medicine. Do not treat yourself. This medicine may decrease your body's ability to fight infection.  You should make sure you get enough calcium and vitamin D while you are taking this medicine, unless your doctor tells you not to. Discuss the foods you eat and the vitamins you take with your health care professional.  See your dentist regularly. Brush and floss your teeth as directed. Before you have any dental work done, tell your dentist you are receiving this medicine.  Do   not become pregnant while taking this medicine or for 5 months after stopping it. Women should inform their doctor if they wish to become pregnant or think they might be pregnant. There is a potential for serious side effects to an unborn child. Talk to your health care professional or pharmacist for more information.  What side effects may I notice from receiving this medicine?  Side effects that you should report to your doctor or health care professional as soon as  possible:  -allergic reactions like skin rash, itching or hives, swelling of the face, lips, or tongue  -breathing problems  -chest pain  -fast, irregular heartbeat  -feeling faint or lightheaded, falls  -fever, chills, or any other sign of infection  -muscle spasms, tightening, or twitches  -numbness or tingling  -skin blisters or bumps, or is dry, peels, or red  -slow healing or unexplained pain in the mouth or jaw  -unusual bleeding or bruising  Side effects that usually do not require medical attention (Report these to your doctor or health care professional if they continue or are bothersome.):  -muscle pain  -stomach upset, gas  This list may not describe all possible side effects. Call your doctor for medical advice about side effects. You may report side effects to FDA at 1-800-FDA-1088.  Where should I keep my medicine?  This medicine is only given in a clinic, doctor's office, or other health care setting and will not be stored at home.  NOTE: This sheet is a summary. It may not cover all possible information. If you have questions about this medicine, talk to your doctor, pharmacist, or health care provider.      2016, [employer] Standard. (2011-08-19 12:37:47)

## 2015-10-04 ENCOUNTER — Other Ambulatory Visit: Payer: BC Managed Care – PPO

## 2015-10-04 ENCOUNTER — Ambulatory Visit: Payer: BC Managed Care – PPO

## 2015-10-04 LAB — CANCER ANTIGEN 27.29: CA 27.29: 184.7 U/mL — ABNORMAL HIGH (ref 0.0–38.6)

## 2015-10-09 ENCOUNTER — Ambulatory Visit: Payer: BC Managed Care – PPO

## 2015-10-09 ENCOUNTER — Other Ambulatory Visit: Payer: BC Managed Care – PPO

## 2015-10-26 ENCOUNTER — Other Ambulatory Visit: Payer: Self-pay | Admitting: *Deleted

## 2015-10-26 DIAGNOSIS — C7951 Secondary malignant neoplasm of bone: Principal | ICD-10-CM

## 2015-10-26 DIAGNOSIS — C50919 Malignant neoplasm of unspecified site of unspecified female breast: Secondary | ICD-10-CM

## 2015-10-27 ENCOUNTER — Ambulatory Visit (HOSPITAL_BASED_OUTPATIENT_CLINIC_OR_DEPARTMENT_OTHER): Payer: BC Managed Care – PPO

## 2015-10-27 ENCOUNTER — Other Ambulatory Visit (HOSPITAL_BASED_OUTPATIENT_CLINIC_OR_DEPARTMENT_OTHER): Payer: BC Managed Care – PPO

## 2015-10-27 VITALS — BP 164/92 | HR 98 | Temp 98.4°F | Resp 17

## 2015-10-27 DIAGNOSIS — C7951 Secondary malignant neoplasm of bone: Secondary | ICD-10-CM | POA: Diagnosis not present

## 2015-10-27 DIAGNOSIS — C773 Secondary and unspecified malignant neoplasm of axilla and upper limb lymph nodes: Secondary | ICD-10-CM

## 2015-10-27 DIAGNOSIS — C50411 Malignant neoplasm of upper-outer quadrant of right female breast: Secondary | ICD-10-CM

## 2015-10-27 DIAGNOSIS — C50919 Malignant neoplasm of unspecified site of unspecified female breast: Secondary | ICD-10-CM

## 2015-10-27 LAB — COMPREHENSIVE METABOLIC PANEL
ALT: 11 U/L (ref 0–55)
ANION GAP: 8 meq/L (ref 3–11)
AST: 14 U/L (ref 5–34)
Albumin: 3.2 g/dL — ABNORMAL LOW (ref 3.5–5.0)
Alkaline Phosphatase: 75 U/L (ref 40–150)
BUN: 12.5 mg/dL (ref 7.0–26.0)
CALCIUM: 9.4 mg/dL (ref 8.4–10.4)
CHLORIDE: 110 meq/L — AB (ref 98–109)
CO2: 27 mEq/L (ref 22–29)
Creatinine: 1.1 mg/dL (ref 0.6–1.1)
EGFR: 63 mL/min/{1.73_m2} — ABNORMAL LOW (ref >90)
Glucose: 110 mg/dl (ref 70–140)
POTASSIUM: 3.7 meq/L (ref 3.5–5.1)
Sodium: 145 mEq/L (ref 136–145)
Total Bilirubin: 0.76 mg/dL (ref 0.20–1.20)
Total Protein: 6.9 g/dL (ref 6.4–8.3)

## 2015-10-27 LAB — CBC WITH DIFFERENTIAL/PLATELET
BASO%: 2.3 % — ABNORMAL HIGH (ref 0.0–2.0)
BASOS ABS: 0.1 10*3/uL (ref 0.0–0.1)
EOS%: 2.7 % (ref 0.0–7.0)
Eosinophils Absolute: 0.1 10*3/uL (ref 0.0–0.5)
HEMATOCRIT: 35.7 % (ref 34.8–46.6)
HGB: 12 g/dL (ref 11.6–15.9)
LYMPH#: 1.6 10*3/uL (ref 0.9–3.3)
LYMPH%: 33.2 % (ref 14.0–49.7)
MCH: 33.1 pg (ref 25.1–34.0)
MCHC: 33.6 g/dL (ref 31.5–36.0)
MCV: 98.6 fL (ref 79.5–101.0)
MONO#: 0.3 10*3/uL (ref 0.1–0.9)
MONO%: 5.2 % (ref 0.0–14.0)
NEUT#: 2.8 10*3/uL (ref 1.5–6.5)
NEUT%: 56.6 % (ref 38.4–76.8)
PLATELETS: 325 10*3/uL (ref 145–400)
RBC: 3.62 10*6/uL — ABNORMAL LOW (ref 3.70–5.45)
RDW: 14.1 % (ref 11.2–14.5)
WBC: 4.9 10*3/uL (ref 3.9–10.3)

## 2015-10-27 MED ORDER — DENOSUMAB 120 MG/1.7ML ~~LOC~~ SOLN
120.0000 mg | Freq: Once | SUBCUTANEOUS | Status: AC
  Administered 2015-10-27: 120 mg via SUBCUTANEOUS
  Filled 2015-10-27: qty 1.7

## 2015-10-27 NOTE — Patient Instructions (Signed)
Denosumab injection  What is this medicine?  DENOSUMAB (den oh sue mab) slows bone breakdown. Prolia is used to treat osteoporosis in women after menopause and in men. Xgeva is used to prevent bone fractures and other bone problems caused by cancer bone metastases. Xgeva is also used to treat giant cell tumor of the bone.  This medicine may be used for other purposes; ask your health care provider or pharmacist if you have questions.  What should I tell my health care provider before I take this medicine?  They need to know if you have any of these conditions:  -dental disease  -eczema  -infection or history of infections  -kidney disease or on dialysis  -low blood calcium or vitamin D  -malabsorption syndrome  -scheduled to have surgery or tooth extraction  -taking medicine that contains denosumab  -thyroid or parathyroid disease  -an unusual reaction to denosumab, other medicines, foods, dyes, or preservatives  -pregnant or trying to get pregnant  -breast-feeding  How should I use this medicine?  This medicine is for injection under the skin. It is given by a health care professional in a hospital or clinic setting.  If you are getting Prolia, a special MedGuide will be given to you by the pharmacist with each prescription and refill. Be sure to read this information carefully each time.  For Prolia, talk to your pediatrician regarding the use of this medicine in children. Special care may be needed. For Xgeva, talk to your pediatrician regarding the use of this medicine in children. While this drug may be prescribed for children as young as 13 years for selected conditions, precautions do apply.  Overdosage: If you think you have taken too much of this medicine contact a poison control center or emergency room at once.  NOTE: This medicine is only for you. Do not share this medicine with others.  What if I miss a dose?  It is important not to miss your dose. Call your doctor or health care professional if you are  unable to keep an appointment.  What may interact with this medicine?  Do not take this medicine with any of the following medications:  -other medicines containing denosumab  This medicine may also interact with the following medications:  -medicines that suppress the immune system  -medicines that treat cancer  -steroid medicines like prednisone or cortisone  This list may not describe all possible interactions. Give your health care provider a list of all the medicines, herbs, non-prescription drugs, or dietary supplements you use. Also tell them if you smoke, drink alcohol, or use illegal drugs. Some items may interact with your medicine.  What should I watch for while using this medicine?  Visit your doctor or health care professional for regular checks on your progress. Your doctor or health care professional may order blood tests and other tests to see how you are doing.  Call your doctor or health care professional if you get a cold or other infection while receiving this medicine. Do not treat yourself. This medicine may decrease your body's ability to fight infection.  You should make sure you get enough calcium and vitamin D while you are taking this medicine, unless your doctor tells you not to. Discuss the foods you eat and the vitamins you take with your health care professional.  See your dentist regularly. Brush and floss your teeth as directed. Before you have any dental work done, tell your dentist you are receiving this medicine.  Do   not become pregnant while taking this medicine or for 5 months after stopping it. Women should inform their doctor if they wish to become pregnant or think they might be pregnant. There is a potential for serious side effects to an unborn child. Talk to your health care professional or pharmacist for more information.  What side effects may I notice from receiving this medicine?  Side effects that you should report to your doctor or health care professional as soon as  possible:  -allergic reactions like skin rash, itching or hives, swelling of the face, lips, or tongue  -breathing problems  -chest pain  -fast, irregular heartbeat  -feeling faint or lightheaded, falls  -fever, chills, or any other sign of infection  -muscle spasms, tightening, or twitches  -numbness or tingling  -skin blisters or bumps, or is dry, peels, or red  -slow healing or unexplained pain in the mouth or jaw  -unusual bleeding or bruising  Side effects that usually do not require medical attention (Report these to your doctor or health care professional if they continue or are bothersome.):  -muscle pain  -stomach upset, gas  This list may not describe all possible side effects. Call your doctor for medical advice about side effects. You may report side effects to FDA at 1-800-FDA-1088.  Where should I keep my medicine?  This medicine is only given in a clinic, doctor's office, or other health care setting and will not be stored at home.  NOTE: This sheet is a summary. It may not cover all possible information. If you have questions about this medicine, talk to your doctor, pharmacist, or health care provider.      2016, [employer] Standard. (2011-08-19 12:37:47)

## 2015-11-07 ENCOUNTER — Ambulatory Visit: Payer: BC Managed Care – PPO

## 2015-11-07 ENCOUNTER — Other Ambulatory Visit: Payer: BC Managed Care – PPO

## 2015-11-08 ENCOUNTER — Telehealth: Payer: Self-pay

## 2015-11-08 NOTE — Telephone Encounter (Signed)
Danielle Oliver with [facility] called for an ibrance refill.

## 2015-11-09 ENCOUNTER — Other Ambulatory Visit: Payer: Self-pay | Admitting: Oncology

## 2015-11-09 ENCOUNTER — Telehealth: Payer: Self-pay

## 2015-11-09 DIAGNOSIS — C50919 Malignant neoplasm of unspecified site of unspecified female breast: Secondary | ICD-10-CM

## 2015-11-09 DIAGNOSIS — C7951 Secondary malignant neoplasm of bone: Principal | ICD-10-CM

## 2015-11-09 MED ORDER — PALBOCICLIB 100 MG PO CAPS: 100.0000 mg | ORAL_CAPSULE | Freq: Every day | ORAL | 0 refills | Status: DC

## 2015-11-09 NOTE — Telephone Encounter (Signed)
Faxed ibrance refill to [facility]

## 2015-11-20 ENCOUNTER — Other Ambulatory Visit: Payer: Self-pay | Admitting: *Deleted

## 2015-11-20 ENCOUNTER — Telehealth: Payer: Self-pay | Admitting: *Deleted

## 2015-11-20 MED ORDER — PREDNISONE 50 MG PO TABS: ORAL_TABLET | ORAL | 0 refills | Status: DC

## 2015-11-20 NOTE — Telephone Encounter (Signed)
Received call from pt stating that she is allergic to dye for CT & needs premeds.  She states she has an appt with Dr Jana Hakim on Fri & not sure if CT needs to be done before CT.  She states they won't schedule CT until premeds are ordered.

## 2015-11-21 ENCOUNTER — Ambulatory Visit (HOSPITAL_COMMUNITY)
Admission: RE | Admit: 2015-11-21 | Discharge: 2015-11-21 | Disposition: A | Discharge status: Home / Self Care | Payer: BC Managed Care – PPO | Source: Ambulatory Visit | Attending: Oncology | Admitting: Oncology

## 2015-11-21 ENCOUNTER — Encounter (HOSPITAL_COMMUNITY): Payer: Self-pay

## 2015-11-21 DIAGNOSIS — C50411 Malignant neoplasm of upper-outer quadrant of right female breast: Secondary | ICD-10-CM | POA: Diagnosis present

## 2015-11-21 DIAGNOSIS — C7951 Secondary malignant neoplasm of bone: Secondary | ICD-10-CM | POA: Diagnosis not present

## 2015-11-21 DIAGNOSIS — C50911 Malignant neoplasm of unspecified site of right female breast: Secondary | ICD-10-CM

## 2015-11-21 MED ORDER — IOPAMIDOL (ISOVUE-300) INJECTION 61%
75.0000 mL | Freq: Once | INTRAVENOUS | Status: AC | PRN
  Administered 2015-11-21: 75 mL via INTRAVENOUS

## 2015-11-23 ENCOUNTER — Other Ambulatory Visit: Payer: Self-pay | Admitting: *Deleted

## 2015-11-23 DIAGNOSIS — C50919 Malignant neoplasm of unspecified site of unspecified female breast: Secondary | ICD-10-CM

## 2015-11-23 DIAGNOSIS — C7951 Secondary malignant neoplasm of bone: Principal | ICD-10-CM

## 2015-11-24 ENCOUNTER — Other Ambulatory Visit (HOSPITAL_BASED_OUTPATIENT_CLINIC_OR_DEPARTMENT_OTHER): Payer: BC Managed Care – PPO

## 2015-11-24 ENCOUNTER — Ambulatory Visit (HOSPITAL_BASED_OUTPATIENT_CLINIC_OR_DEPARTMENT_OTHER): Payer: BC Managed Care – PPO | Admitting: Oncology

## 2015-11-24 ENCOUNTER — Ambulatory Visit (HOSPITAL_BASED_OUTPATIENT_CLINIC_OR_DEPARTMENT_OTHER): Payer: BC Managed Care – PPO

## 2015-11-24 VITALS — BP 126/88 | HR 96 | Temp 97.9°F | Resp 18 | Ht 67.0 in | Wt 370.6 lb

## 2015-11-24 DIAGNOSIS — C773 Secondary and unspecified malignant neoplasm of axilla and upper limb lymph nodes: Secondary | ICD-10-CM

## 2015-11-24 DIAGNOSIS — C50411 Malignant neoplasm of upper-outer quadrant of right female breast: Secondary | ICD-10-CM

## 2015-11-24 DIAGNOSIS — C7951 Secondary malignant neoplasm of bone: Secondary | ICD-10-CM

## 2015-11-24 DIAGNOSIS — E538 Deficiency of other specified B group vitamins: Secondary | ICD-10-CM | POA: Diagnosis not present

## 2015-11-24 DIAGNOSIS — C50911 Malignant neoplasm of unspecified site of right female breast: Secondary | ICD-10-CM

## 2015-11-24 DIAGNOSIS — C50919 Malignant neoplasm of unspecified site of unspecified female breast: Secondary | ICD-10-CM

## 2015-11-24 DIAGNOSIS — Z79811 Long term (current) use of aromatase inhibitors: Secondary | ICD-10-CM

## 2015-11-24 DIAGNOSIS — Z170 Estrogen receptor positive status [ER+]: Secondary | ICD-10-CM

## 2015-11-24 LAB — CBC WITH DIFFERENTIAL/PLATELET
BASO%: 1.3 % (ref 0.0–2.0)
Basophils Absolute: 0.1 10*3/uL (ref 0.0–0.1)
EOS%: 2.8 % (ref 0.0–7.0)
Eosinophils Absolute: 0.2 10*3/uL (ref 0.0–0.5)
HEMATOCRIT: 39 % (ref 34.8–46.6)
HEMOGLOBIN: 13.1 g/dL (ref 11.6–15.9)
LYMPH#: 1.7 10*3/uL (ref 0.9–3.3)
LYMPH%: 22.3 % (ref 14.0–49.7)
MCH: 32.8 pg (ref 25.1–34.0)
MCHC: 33.6 g/dL (ref 31.5–36.0)
MCV: 97.5 fL (ref 79.5–101.0)
MONO#: 0.6 10*3/uL (ref 0.1–0.9)
MONO%: 8.1 % (ref 0.0–14.0)
NEUT%: 65.5 % (ref 38.4–76.8)
NEUTROS ABS: 5.1 10*3/uL (ref 1.5–6.5)
PLATELETS: 361 10*3/uL (ref 145–400)
RBC: 4 10*6/uL (ref 3.70–5.45)
RDW: 13.6 % (ref 11.2–14.5)
WBC: 7.8 10*3/uL (ref 3.9–10.3)

## 2015-11-24 LAB — COMPREHENSIVE METABOLIC PANEL
ALBUMIN: 3.3 g/dL — AB (ref 3.5–5.0)
ALK PHOS: 84 U/L (ref 40–150)
ALT: 12 U/L (ref 0–55)
ANION GAP: 9 meq/L (ref 3–11)
AST: 12 U/L (ref 5–34)
BILIRUBIN TOTAL: 0.73 mg/dL (ref 0.20–1.20)
BUN: 15 mg/dL (ref 7.0–26.0)
CALCIUM: 9.2 mg/dL (ref 8.4–10.4)
CHLORIDE: 106 meq/L (ref 98–109)
CO2: 28 mEq/L (ref 22–29)
CREATININE: 1.1 mg/dL (ref 0.6–1.1)
EGFR: 67 mL/min/{1.73_m2} — ABNORMAL LOW (ref >90)
Glucose: 97 mg/dl (ref 70–140)
Potassium: 3.8 mEq/L (ref 3.5–5.1)
Sodium: 143 mEq/L (ref 136–145)
TOTAL PROTEIN: 7.2 g/dL (ref 6.4–8.3)

## 2015-11-24 MED ORDER — PREDNISONE 50 MG PO TABS: ORAL_TABLET | ORAL | 0 refills | Status: DC

## 2015-11-24 MED ORDER — PALBOCICLIB 100 MG PO CAPS: 100.0000 mg | ORAL_CAPSULE | Freq: Every day | ORAL | 0 refills | Status: DC

## 2015-11-24 MED ORDER — DENOSUMAB 120 MG/1.7ML ~~LOC~~ SOLN
120.0000 mg | Freq: Once | SUBCUTANEOUS | Status: AC
  Administered 2015-11-24: 120 mg via SUBCUTANEOUS
  Filled 2015-11-24: qty 1.7

## 2015-11-24 MED ORDER — ANASTROZOLE 1 MG PO TABS: 1.0000 mg | ORAL_TABLET | Freq: Every day | ORAL | 3 refills | Status: DC

## 2015-11-24 NOTE — Patient Instructions (Signed)
Denosumab injection  What is this medicine?  DENOSUMAB (den oh sue mab) slows bone breakdown. Prolia is used to treat osteoporosis in women after menopause and in men. Xgeva is used to prevent bone fractures and other bone problems caused by cancer bone metastases. Xgeva is also used to treat giant cell tumor of the bone.  This medicine may be used for other purposes; ask your health care provider or pharmacist if you have questions.  What should I tell my health care provider before I take this medicine?  They need to know if you have any of these conditions:  -dental disease  -eczema  -infection or history of infections  -kidney disease or on dialysis  -low blood calcium or vitamin D  -malabsorption syndrome  -scheduled to have surgery or tooth extraction  -taking medicine that contains denosumab  -thyroid or parathyroid disease  -an unusual reaction to denosumab, other medicines, foods, dyes, or preservatives  -pregnant or trying to get pregnant  -breast-feeding  How should I use this medicine?  This medicine is for injection under the skin. It is given by a health care professional in a hospital or clinic setting.  If you are getting Prolia, a special MedGuide will be given to you by the pharmacist with each prescription and refill. Be sure to read this information carefully each time.  For Prolia, talk to your pediatrician regarding the use of this medicine in children. Special care may be needed. For Xgeva, talk to your pediatrician regarding the use of this medicine in children. While this drug may be prescribed for children as young as 13 years for selected conditions, precautions do apply.  Overdosage: If you think you have taken too much of this medicine contact a poison control center or emergency room at once.  NOTE: This medicine is only for you. Do not share this medicine with others.  What if I miss a dose?  It is important not to miss your dose. Call your doctor or health care professional if you are  unable to keep an appointment.  What may interact with this medicine?  Do not take this medicine with any of the following medications:  -other medicines containing denosumab  This medicine may also interact with the following medications:  -medicines that suppress the immune system  -medicines that treat cancer  -steroid medicines like prednisone or cortisone  This list may not describe all possible interactions. Give your health care provider a list of all the medicines, herbs, non-prescription drugs, or dietary supplements you use. Also tell them if you smoke, drink alcohol, or use illegal drugs. Some items may interact with your medicine.  What should I watch for while using this medicine?  Visit your doctor or health care professional for regular checks on your progress. Your doctor or health care professional may order blood tests and other tests to see how you are doing.  Call your doctor or health care professional if you get a cold or other infection while receiving this medicine. Do not treat yourself. This medicine may decrease your body's ability to fight infection.  You should make sure you get enough calcium and vitamin D while you are taking this medicine, unless your doctor tells you not to. Discuss the foods you eat and the vitamins you take with your health care professional.  See your dentist regularly. Brush and floss your teeth as directed. Before you have any dental work done, tell your dentist you are receiving this medicine.  Do   not become pregnant while taking this medicine or for 5 months after stopping it. Women should inform their doctor if they wish to become pregnant or think they might be pregnant. There is a potential for serious side effects to an unborn child. Talk to your health care professional or pharmacist for more information.  What side effects may I notice from receiving this medicine?  Side effects that you should report to your doctor or health care professional as soon as  possible:  -allergic reactions like skin rash, itching or hives, swelling of the face, lips, or tongue  -breathing problems  -chest pain  -fast, irregular heartbeat  -feeling faint or lightheaded, falls  -fever, chills, or any other sign of infection  -muscle spasms, tightening, or twitches  -numbness or tingling  -skin blisters or bumps, or is dry, peels, or red  -slow healing or unexplained pain in the mouth or jaw  -unusual bleeding or bruising  Side effects that usually do not require medical attention (Report these to your doctor or health care professional if they continue or are bothersome.):  -muscle pain  -stomach upset, gas  This list may not describe all possible side effects. Call your doctor for medical advice about side effects. You may report side effects to FDA at 1-800-FDA-1088.  Where should I keep my medicine?  This medicine is only given in a clinic, doctor's office, or other health care setting and will not be stored at home.  NOTE: This sheet is a summary. It may not cover all possible information. If you have questions about this medicine, talk to your doctor, pharmacist, or health care provider.      2016, [employer] Standard. (2011-08-19 12:37:47)

## 2015-11-24 NOTE — Progress Notes (Signed)
Visit  [place]  [phone] [phone]   ID: Danielle Oliver DOB: [birth year]  MR#: [id number]  SFK#:[id number]  Patient Care Team: Helane Rima, MD as PCP - General (Family Medicine) Thea Silversmith, MD as Consulting Physician (Radiation Oncology) Holley Bouche, NP as Nurse Practitioner (Nurse Practitioner) Chauncey Cruel, MD as Consulting Physician (Oncology) Alphonsa Overall, MD as Consulting Physician (General Surgery) PCP: Helane Rima, MD GYN: OTHER MD: [name] MD  CHIEF COMPLAINT: Stage IV breast cancer  CURRENT TREATMENT: Anastrozole, palbociclib, denosumab   BREAST CANCER HISTORY: From Dr Krista [facility] 03/11/2014 intake note:  "She was diagnosed with stage III left breast cancer in 1991 when she was 55 years old. Unfortunately her breast cancer diagnosis and treatment history was not available for review today. Apparently she had left mastectomy, and 4 cycles of adjuvant chemotherapy under Dr. Mariana Kaufman care at our cancer center. Patient does not recall the name of the chemotherapy medication. She was also offered a clinical trial of bone marrow transplant for her breast cancer and she declined. She was followed by January mammograms and had no evidence of recurrence. Her last screening mammogram was in 2009 which was benign.  She noticed a lump in her right breast about 20 weeks ago. It was hard and the tender on palpitation. She denies any skin change or nipple discharge. She has no appetite change, weight loss, or other new symptoms. She had underwent a diagnostic mammogram on 01/12/2014, which showed a 3.6 cm lobulated solid mass in right upper quadrant of right breast, and an oval axillary lymph nodes was cortical thickening. 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 Her right axillary lymph node biopsy also showed  invasive adenocarcinoma."  Her subsequent history is as detailed below  INTERVAL HISTORY: Danielle Oliver returns today for follow-up of her metastatic breast cancer. She continues on palbociclib at 100 mg 21 days on, 7 days off. She is completing the first week of the current cycle today. She tolerates this well except that she feels a little bit more fatigued on the third week of each cycle. She also has slight dryness and peeling of her hands, both the dorsum and polyps. She doesn't have any other skin changes, and no other side effects from this medication that she is aware of.  She tolerates anastrozole with no side effects at all that she reports. She also tolerates the Xgeva/denosumab well she is due for a dose today.  REVIEW OF SYSTEMS: A detailed review of systems today was otherwise entirely negative  PAST MEDICAL HISTORY: Past Medical History:  Diagnosis Date  . Arthritis   . Breast cancer ([place])   . Dysrhythmia   . Gout   . Heart murmur    one Dr told her, "nothing to worry about"  . Hypertension   . Morbid obesity with body mass index of 50.0-59.9 in adult [name])   . Pneumonia 2013 ish  . Shortness of breath dyspnea    With exertion    PAST SURGICAL HISTORY: Past Surgical History:  Procedure Laterality Date  . BREAST LUMPECTOMY WITH RADIOACTIVE SEED LOCALIZATION Right 02/17/2015   Procedure: RIGHT BREAST LUMPECTOMY WITH RADIOACTIVE SEED LOCALIZATION;  Surgeon: Alphonsa Overall, MD;  Location: [facility];  Service: General;  Laterality: Right;  . COLONOSCOPY    . FRACTURE SURGERY Right   . MASTECTOMY Left 1992  . TONSILLECTOMY  FAMILY HISTORY Family History  Problem Relation Age of Onset  . Colon cancer Father    the patient's father died at 71 from colon cancer which was diagnosed shortly before his death. The patient's mother is living, age 54 as of July 2016. The patient is an only child. The only other cancer in the family to her knowledge is a paternal great aunt who  developed breast cancer in her late 63s. There is no history of ovarian cancer and no other history of colon cancer in the family to her knowledge  GYNECOLOGIC HISTORY:  No LMP recorded. Patient is postmenopausal. menarche age 54 first live birth age 65 the patient is [place] P3. She stopped having periods in 2013. She did not take hormone replacement.   SOCIAL HISTORY: (Updated July 2016 [place] works as an Passenger transport manager. She is divorced. At home is just she and her son [name], 54 years old. Son [name], 54, lives in [name] where he works as a Fish farm manager. Son [name], 54, lives in [place] and is working on a Oceanographer in counseling at [employer]. The patient has one granddaughter, [name], in [name]. She is not a church attender    ADVANCED DIRECTIVES: Not in place   HEALTH MAINTENANCE: Social History  Substance Use Topics  . Smoking status: Never Smoker  . Smokeless tobacco: Not on file  . Alcohol use No     Comment: rare     Colonoscopy: remote/ [name]  PAP: 2014  Bone density:  Lipid panel:  Allergies  Allergen Reactions  . Iodinated Diagnostic Agents Anaphylaxis  . Penicillins Rash    Has patient had a PCN reaction causing immediate rash, facial/tongue/throat swelling, SOB or lightheadedness with hypotension: Yes  Has patient had a PCN reaction causing severe rash involving mucus membranes or skin necrosis: No Has patient had a PCN reaction that required hospitalization No Has patient had a PCN reaction occurring within the last 10 years: Yes If all of the above answers are "NO", then may proceed with Cephalosporin use.    Current Outpatient Prescriptions  Medication Sig Dispense Refill  . amLODipine-olmesartan (AZOR) 10-40 MG tablet Take 1 tablet by mouth daily. 90 tablet 4  . anastrozole (ARIMIDEX) 1 MG tablet Take 1 tablet (1 mg total) by mouth daily. 90 tablet 3  . Calcium Carb-Cholecalciferol (CALTRATE 600+D) 600-800 MG-UNIT TABS Take by mouth 2 (two)  times daily. 60 tablet   . ibuprofen (ADVIL,MOTRIN) 200 MG tablet Take 200-400 mg by mouth daily as needed. Reported on 05/22/2015    . lovastatin (MEVACOR) 40 MG tablet Take 40 mg by mouth daily.    . palbociclib (IBRANCE) 100 MG capsule Take 1 capsule (100 mg total) by mouth daily with breakfast. Take whole with food for 21 days, rest 7 days and repeat. 21 capsule 0  . predniSONE (DELTASONE) 50 MG tablet Pt to take 1 tab 13 hours, 7 hours, & 1 hour prior to radiology procedure. 3 tablet 0   No current facility-administered medications for this visit.    Facility-Administered Medications Ordered in Other Visits  Medication Dose Route Frequency Provider Last Rate Last Dose  . denosumab (XGEVA) injection 120 mg  120 mg Subcutaneous Once Chauncey Cruel, MD        OBJECTIVE:Morbidly obese African-American woman n no acute distress Vitals:   11/24/15 0915  BP: 126/88  Pulse: 96  Resp: 18  Temp: 97.9 F (36.6 C)     Body mass index is 58.04 kg/m.  ECOG FS:1 - Symptomatic but completely ambulatory  Sclerae unicteric, pupils round and equal Oropharynx clear and moist-- no thrush or other lesions No cervical or supraclavicular adenopathy Lungs no rales or rhonchi Heart regular rate and rhythm Abd soft, obese, nontender, positive bowel sounds MSK no focal spinal tenderness, no upper extremity lymphedema Neuro: nonfocal, well oriented, appropriate affect Breasts: The right breast is status post lumpectomy area there is no evidence of local recurrence. The right axilla is benign. The left breast is status post mastectomy. There is no evidence of chest wall recurrence. Left axilla is benign.    LAB RESULTS:  CMP     Component Value Date/Time   NA 143 11/24/2015 0848   K 3.8 11/24/2015 0848   CL 110 02/16/2015 1152   CO2 28 11/24/2015 0848   GLUCOSE 97 11/24/2015 0848   BUN 15.0 11/24/2015 0848   CREATININE 1.1 11/24/2015 0848   CALCIUM 9.2 11/24/2015 0848   PROT 7.2  11/24/2015 0848   ALBUMIN 3.3 (L) 11/24/2015 0848   AST 12 11/24/2015 0848   ALT 12 11/24/2015 0848   ALKPHOS 84 11/24/2015 0848   BILITOT 0.73 11/24/2015 0848   GFRNONAA >60 02/16/2015 1152   GFRAA >60 02/16/2015 1152     INo results found for: SPEP, UPEP  Lab Results  Component Value Date   WBC 7.8 11/24/2015   NEUTROABS 5.1 11/24/2015   HGB 13.1 11/24/2015   HCT 39.0 11/24/2015   MCV 97.5 11/24/2015   PLT 361 11/24/2015      Chemistry      Component Value Date/Time   NA 143 11/24/2015 0848   K 3.8 11/24/2015 0848   CL 110 02/16/2015 1152   CO2 28 11/24/2015 0848   BUN 15.0 11/24/2015 0848   CREATININE 1.1 11/24/2015 0848      Component Value Date/Time   CALCIUM 9.2 11/24/2015 0848   ALKPHOS 84 11/24/2015 0848   AST 12 11/24/2015 0848   ALT 12 11/24/2015 0848   BILITOT 0.73 11/24/2015 0848       Lab Results  Component Value Date   LABCA2 85 (H) 05/22/2015    No components found for: [id number]  No results for input(s): INR in the last 168 hours.  Urinalysis    Component Value Date/Time   LABSPEC 1.015 04/27/2007 1654   PHURINE 6.0 04/27/2007 1654   GLUCOSEU NEGATIVE 04/27/2007 1654   HGBUR NEGATIVE 04/27/2007 1654   BILIRUBINUR NEGATIVE 04/27/2007 1654   KETONESUR NEGATIVE 04/27/2007 1654   PROTEINUR NEGATIVE 04/27/2007 1654   UROBILINOGEN 0.2 04/27/2007 1654   NITRITE NEGATIVE 04/27/2007 1654   LEUKOCYTESUR  04/27/2007 1654    NEGATIVE Biochemical Testing Only. Please order routine urinalysis from main lab if confirmatory testing is needed.   STUDIES: Ct Chest W Contrast  Result Date: 11/21/2015 CLINICAL DATA:  Restaging breast cancer. Initial diagnosis November 2015. History of bilateral mastectomies. EXAM: CT CHEST WITH CONTRAST TECHNIQUE: Multidetector CT imaging of the chest was performed during intravenous contrast administration. CONTRAST:  24m ISOVUE-300 IOPAMIDOL (ISOVUE-300) INJECTION 61% COMPARISON:  Chest CT 02/24/2015 FINDINGS: Chest  wall: Surgical changes from a left mastectomy and a right lumpectomy with a surgical clip or biopsy clip noted near an area of soft tissue density which is most likely scar tissue based on the prior CT which showed large postoperative hematoma and fluid in this area. No supraclavicular or axillary lymphadenopathy. But no findings to suggest a chest wall recurrence. Cardiovascular: The heart is normal in size. No pericardial  effusion. The aorta is normal in caliber. No atherosclerotic calcifications. Mediastinum/Nodes: No mediastinal or hilar mass or adenopathy. Small scattered lymph nodes are stable. The esophagus is grossly normal. Lungs/Pleura: No acute pulmonary findings. No worrisome pulmonary nodules to suggest pulmonary metastatic disease. No pleural effusion or pleural nodules. Upper Abdomen: No significant upper abdominal findings. No evidence of hepatic or adrenal gland metastasis. Stable cholelithiasis. Musculoskeletal: Stable diffuse lytic and sclerotic osseous metastatic disease. No obvious pathologic fracture. No spinal canal compromise. IMPRESSION: 1. 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 No pathologic fracture or spinal canal compromise. 2. No findings for chest wall recurrence, supraclavicular or axillary adenopathy or metastatic disease involving the chest or upper abdomen. Electronically Signed   By: Marijo Sanes M.D.   On: 11/21/2015 17:46    ASSESSMENT: 55 y.o. [place] woman  (1) status post left mastectomy in 1991 followed by adjuvant chemotherapy Consisting of doxorubicin and cyclophosphamide given every 21 days 4 (records under "media")  (2) status post right breast upper outer quadrant biopsy 01/13/2014 for a  cT2 p N1, stage IIB invasive ductal carcinoma, estrogen and progesterone receptor positive, HER-2 negative, with an MIB-1 of 10%.  METASTATIC DISEASE: CT scan 03/18/2014 shows multiple sclerotic bone mets but no visceral disease  (1) no  bone biopsy obtained to this point  (b) CA 27-29 is informative  (3) anastrozole started January 2016  (a)  palbociclib added 11/21/2014 at 125 mg daily, 21/7  (b) Palbociclib dose decreased to 100 mg daily, 21/ 7, with cycle 2, starting 12/21/2014  (4) vitamin D deficiency: On replacement  (5) status post right lumpectomy 02/17/2015 for a pT2 pNX invasive ductal carcinoma, grade 1, estrogen receptor 100% positive, progesterone receptor 20% positive, HER-2 not amplified. Margins were negative  (a) radiation therapy to lumpectomy site declined by patient  (6) denosumab/ Xgeva started 12/15/2014, repeated monthly  (7) genetics testing offered. Patient still deciding   PLAN: Aivy is now a little over a year and a half out from diagnosis of metastatic breast cancer, with no clinical evidence of disease activity. This is very favorable.  She does have a rise in her CA-27-29. She understands that a continuing trend in this direction might indicate disease progression, but that this is less reliable than the CT scan we just obtained, which continues to show no visceral disease and stable bone lesions. Recall that bone scan 07/21/2015 show no abnormal uptake in the bones.  I think it would be helpful to get a PET scan and I am scheduling that R the second week in December.  Otherwise we are continuing the anastrozole and palbociclib combination as before. She will receive denosumab/Xgeva today and every 28 days and we will check lab work at the same time. She has not required any further decrease in her palbociclib dose.  I suggested she try an aloe vera-based cream for the slight peeling or dryness that she has on her hands.  For any other problems that may develop before her next visit here.  Chauncey Cruel, MD

## 2015-12-22 ENCOUNTER — Other Ambulatory Visit: Payer: BC Managed Care – PPO

## 2015-12-22 ENCOUNTER — Ambulatory Visit: Payer: BC Managed Care – PPO

## 2016-01-19 ENCOUNTER — Ambulatory Visit: Payer: BC Managed Care – PPO

## 2016-01-19 ENCOUNTER — Other Ambulatory Visit: Payer: BC Managed Care – PPO

## 2016-02-01 ENCOUNTER — Telehealth: Payer: Self-pay | Admitting: *Deleted

## 2016-02-01 NOTE — Telephone Encounter (Signed)
Scheduling message sent via Inbox per pt's request

## 2016-02-01 NOTE — Telephone Encounter (Signed)
"  I need to reschedule my injections.  Awaiting return call.  I was wanting to move the F/U December 15 th farther out anyway to not have to take so much time off work.  Last Denosumab injection was November 24, 2015.  I can come in anytime except on December 11 th not available until after 11:00 am."  Return number [phone].

## 2016-02-09 ENCOUNTER — Other Ambulatory Visit: Payer: Self-pay | Admitting: Oncology

## 2016-02-09 DIAGNOSIS — C7951 Secondary malignant neoplasm of bone: Principal | ICD-10-CM

## 2016-02-09 DIAGNOSIS — C50919 Malignant neoplasm of unspecified site of unspecified female breast: Secondary | ICD-10-CM

## 2016-02-15 ENCOUNTER — Encounter: Payer: Self-pay | Admitting: Oncology

## 2016-02-15 NOTE — Progress Notes (Signed)
Received approval for Ibrance from [place].  Ibrance approved 03/04/16-03/04/17.

## 2016-02-16 ENCOUNTER — Ambulatory Visit: Payer: BC Managed Care – PPO

## 2016-02-16 ENCOUNTER — Ambulatory Visit: Payer: BC Managed Care – PPO | Admitting: Oncology

## 2016-02-16 ENCOUNTER — Other Ambulatory Visit: Payer: BC Managed Care – PPO

## 2016-02-19 ENCOUNTER — Other Ambulatory Visit: Payer: Self-pay | Admitting: Oncology

## 2016-02-22 ENCOUNTER — Telehealth: Payer: Self-pay | Admitting: Oncology

## 2016-02-22 NOTE — Telephone Encounter (Signed)
Received request from [facility] via [place], to call patient to reschedule her injection appointment. Appointment was rescheduled to 02/23/16, per patient request and patient stated that she will keep her upcoming appointment with Dr Jana Hakim for January.

## 2016-02-23 ENCOUNTER — Ambulatory Visit (HOSPITAL_BASED_OUTPATIENT_CLINIC_OR_DEPARTMENT_OTHER): Payer: BC Managed Care – PPO

## 2016-02-23 ENCOUNTER — Other Ambulatory Visit: Payer: Self-pay | Admitting: *Deleted

## 2016-02-23 ENCOUNTER — Telehealth: Payer: Self-pay | Admitting: *Deleted

## 2016-02-23 VITALS — BP 145/82 | HR 100 | Temp 98.4°F | Resp 18

## 2016-02-23 DIAGNOSIS — C50919 Malignant neoplasm of unspecified site of unspecified female breast: Secondary | ICD-10-CM

## 2016-02-23 DIAGNOSIS — C7951 Secondary malignant neoplasm of bone: Principal | ICD-10-CM

## 2016-02-23 DIAGNOSIS — C50411 Malignant neoplasm of upper-outer quadrant of right female breast: Secondary | ICD-10-CM

## 2016-02-23 DIAGNOSIS — C50911 Malignant neoplasm of unspecified site of right female breast: Secondary | ICD-10-CM

## 2016-02-23 LAB — CBC WITH DIFFERENTIAL/PLATELET
BASO%: 0.1 % (ref 0.0–2.0)
Basophils Absolute: 0 10*3/uL (ref 0.0–0.1)
EOS%: 3.1 % (ref 0.0–7.0)
Eosinophils Absolute: 0.1 10*3/uL (ref 0.0–0.5)
HEMATOCRIT: 38.4 % (ref 34.8–46.6)
HGB: 12.7 g/dL (ref 11.6–15.9)
LYMPH#: 1.5 10*3/uL (ref 0.9–3.3)
LYMPH%: 33.8 % (ref 14.0–49.7)
MCH: 31.9 pg (ref 25.1–34.0)
MCHC: 33 g/dL (ref 31.5–36.0)
MCV: 96.6 fL (ref 79.5–101.0)
MONO#: 0.3 10*3/uL (ref 0.1–0.9)
MONO%: 7.3 % (ref 0.0–14.0)
NEUT%: 55.7 % (ref 38.4–76.8)
NEUTROS ABS: 2.5 10*3/uL (ref 1.5–6.5)
PLATELETS: 221 10*3/uL (ref 145–400)
RBC: 3.98 10*6/uL (ref 3.70–5.45)
RDW: 15.6 % — ABNORMAL HIGH (ref 11.2–14.5)
WBC: 4.5 10*3/uL (ref 3.9–10.3)

## 2016-02-23 LAB — COMPREHENSIVE METABOLIC PANEL
ALT: 15 U/L (ref 0–55)
ANION GAP: 8 meq/L (ref 3–11)
AST: 14 U/L (ref 5–34)
Albumin: 3.5 g/dL (ref 3.5–5.0)
Alkaline Phosphatase: 108 U/L (ref 40–150)
BILIRUBIN TOTAL: 1.02 mg/dL (ref 0.20–1.20)
BUN: 12.9 mg/dL (ref 7.0–26.0)
CALCIUM: 9.1 mg/dL (ref 8.4–10.4)
CO2: 26 meq/L (ref 22–29)
CREATININE: 0.9 mg/dL (ref 0.6–1.1)
Chloride: 106 mEq/L (ref 98–109)
EGFR: 82 mL/min/{1.73_m2} — ABNORMAL LOW (ref >90)
Glucose: 105 mg/dl (ref 70–140)
Potassium: 4.2 mEq/L (ref 3.5–5.1)
Sodium: 140 mEq/L (ref 136–145)
TOTAL PROTEIN: 7.2 g/dL (ref 6.4–8.3)

## 2016-02-23 MED ORDER — DENOSUMAB 120 MG/1.7ML ~~LOC~~ SOLN
120.0000 mg | Freq: Once | SUBCUTANEOUS | Status: AC
  Administered 2016-02-23: 120 mg via SUBCUTANEOUS
  Filled 2016-02-23: qty 1.7

## 2016-02-23 NOTE — Telephone Encounter (Signed)
Lab orders entered

## 2016-02-24 LAB — CANCER ANTIGEN 27.29: CAN 27.29: 478.4 U/mL — AB (ref 0.0–38.6)

## 2016-02-28 ENCOUNTER — Encounter (HOSPITAL_COMMUNITY)
Admission: RE | Admit: 2016-02-28 | Discharge: 2016-02-28 | Disposition: A | Discharge status: Home / Self Care | Payer: BC Managed Care – PPO | Source: Ambulatory Visit | Attending: Oncology | Admitting: Oncology

## 2016-02-28 DIAGNOSIS — C7951 Secondary malignant neoplasm of bone: Secondary | ICD-10-CM | POA: Diagnosis present

## 2016-02-28 DIAGNOSIS — C50919 Malignant neoplasm of unspecified site of unspecified female breast: Secondary | ICD-10-CM | POA: Diagnosis not present

## 2016-02-28 MED ORDER — FLUDEOXYGLUCOSE F - 18 (FDG) INJECTION
20.8000 | Freq: Once | INTRAVENOUS | Status: AC | PRN
  Administered 2016-02-28: 20.8 via INTRAVENOUS

## 2016-02-29 ENCOUNTER — Other Ambulatory Visit: Payer: Self-pay | Admitting: *Deleted

## 2016-02-29 ENCOUNTER — Ambulatory Visit (HOSPITAL_COMMUNITY)
Admission: RE | Admit: 2016-02-29 | Discharge: 2016-02-29 | Disposition: A | Discharge status: Home / Self Care | Payer: BC Managed Care – PPO | Source: Ambulatory Visit | Attending: Oncology | Admitting: Oncology

## 2016-02-29 DIAGNOSIS — C50919 Malignant neoplasm of unspecified site of unspecified female breast: Secondary | ICD-10-CM | POA: Insufficient documentation

## 2016-02-29 DIAGNOSIS — C7951 Secondary malignant neoplasm of bone: Secondary | ICD-10-CM | POA: Insufficient documentation

## 2016-02-29 MED ORDER — IOPAMIDOL (ISOVUE-300) INJECTION 61%
INTRAVENOUS | Status: AC
  Filled 2016-02-29: qty 75

## 2016-02-29 MED ORDER — IOPAMIDOL (ISOVUE-300) INJECTION 61%
75.0000 mL | Freq: Once | INTRAVENOUS | Status: AC | PRN
  Administered 2016-02-29: 75 mL via INTRAVENOUS

## 2016-02-29 MED ORDER — PALBOCICLIB 100 MG PO CAPS: 100.0000 mg | ORAL_CAPSULE | Freq: Every day | ORAL | 0 refills | Status: DC

## 2016-03-03 ENCOUNTER — Other Ambulatory Visit: Payer: Self-pay | Admitting: Oncology

## 2016-03-07 ENCOUNTER — Ambulatory Visit: Payer: BC Managed Care – PPO

## 2016-03-07 ENCOUNTER — Other Ambulatory Visit (HOSPITAL_BASED_OUTPATIENT_CLINIC_OR_DEPARTMENT_OTHER): Payer: BC Managed Care – PPO

## 2016-03-07 ENCOUNTER — Ambulatory Visit (HOSPITAL_BASED_OUTPATIENT_CLINIC_OR_DEPARTMENT_OTHER): Payer: BC Managed Care – PPO | Admitting: Oncology

## 2016-03-07 VITALS — BP 144/57 | HR 94 | Temp 98.1°F | Resp 20 | Wt 364.4 lb

## 2016-03-07 DIAGNOSIS — Z79811 Long term (current) use of aromatase inhibitors: Secondary | ICD-10-CM

## 2016-03-07 DIAGNOSIS — R978 Other abnormal tumor markers: Secondary | ICD-10-CM

## 2016-03-07 DIAGNOSIS — C50411 Malignant neoplasm of upper-outer quadrant of right female breast: Secondary | ICD-10-CM

## 2016-03-07 DIAGNOSIS — C7951 Secondary malignant neoplasm of bone: Secondary | ICD-10-CM

## 2016-03-07 DIAGNOSIS — E559 Vitamin D deficiency, unspecified: Secondary | ICD-10-CM

## 2016-03-07 DIAGNOSIS — C50911 Malignant neoplasm of unspecified site of right female breast: Secondary | ICD-10-CM

## 2016-03-07 DIAGNOSIS — N951 Menopausal and female climacteric states: Secondary | ICD-10-CM

## 2016-03-07 DIAGNOSIS — C50919 Malignant neoplasm of unspecified site of unspecified female breast: Secondary | ICD-10-CM

## 2016-03-07 DIAGNOSIS — Z170 Estrogen receptor positive status [ER+]: Secondary | ICD-10-CM

## 2016-03-07 LAB — COMPREHENSIVE METABOLIC PANEL
ALT: 17 U/L (ref 0–55)
AST: 17 U/L (ref 5–34)
Albumin: 3.6 g/dL (ref 3.5–5.0)
Alkaline Phosphatase: 107 U/L (ref 40–150)
Anion Gap: 10 mEq/L (ref 3–11)
BUN: 14.3 mg/dL (ref 7.0–26.0)
CALCIUM: 9.3 mg/dL (ref 8.4–10.4)
CHLORIDE: 106 meq/L (ref 98–109)
CO2: 24 mEq/L (ref 22–29)
Creatinine: 1 mg/dL (ref 0.6–1.1)
EGFR: 78 mL/min/{1.73_m2} — ABNORMAL LOW (ref >90)
Glucose: 104 mg/dl (ref 70–140)
POTASSIUM: 3.7 meq/L (ref 3.5–5.1)
Sodium: 140 mEq/L (ref 136–145)
Total Bilirubin: 1.01 mg/dL (ref 0.20–1.20)
Total Protein: 7.4 g/dL (ref 6.4–8.3)

## 2016-03-07 LAB — CBC WITH DIFFERENTIAL/PLATELET
BASO%: 1.3 % (ref 0.0–2.0)
BASOS ABS: 0.1 10*3/uL (ref 0.0–0.1)
EOS%: 1.8 % (ref 0.0–7.0)
Eosinophils Absolute: 0.1 10*3/uL (ref 0.0–0.5)
HEMATOCRIT: 37.8 % (ref 34.8–46.6)
HGB: 12.8 g/dL (ref 11.6–15.9)
LYMPH%: 22.6 % (ref 14.0–49.7)
MCH: 32.8 pg (ref 25.1–34.0)
MCHC: 33.8 g/dL (ref 31.5–36.0)
MCV: 97.2 fL (ref 79.5–101.0)
MONO#: 0.9 10*3/uL (ref 0.1–0.9)
MONO%: 11.2 % (ref 0.0–14.0)
NEUT#: 4.9 10*3/uL (ref 1.5–6.5)
NEUT%: 63.1 % (ref 38.4–76.8)
Platelets: 277 10*3/uL (ref 145–400)
RBC: 3.89 10*6/uL (ref 3.70–5.45)
RDW: 16.2 % — ABNORMAL HIGH (ref 11.2–14.5)
WBC: 7.7 10*3/uL (ref 3.9–10.3)
lymph#: 1.7 10*3/uL (ref 0.9–3.3)

## 2016-03-07 LAB — TECHNOLOGIST REVIEW

## 2016-03-07 NOTE — Progress Notes (Signed)
Visit  [place]  [phone] [phone]   ID: Danielle Oliver DOB: [birth year]  MR#: [id number]  TXM#:[id number]  Patient Care Team: Helane Rima, MD as PCP - General (Family Medicine) Thea Silversmith, MD as Consulting Physician (Radiation Oncology) Holley Bouche, NP as Nurse Practitioner (Nurse Practitioner) Chauncey Cruel, MD as Consulting Physician (Oncology) Alphonsa Overall, MD as Consulting Physician (General Surgery) PCP: Helane Rima, MD GYN: OTHER MD: [name] MD  CHIEF COMPLAINT: Stage IV breast cancer  CURRENT TREATMENT: Anastrozole, palbociclib, denosumab   BREAST CANCER HISTORY: From Dr Krista [facility] 03/11/2014 intake note:  "She was diagnosed with stage III left breast cancer in 1991 when she was 56 years old. Unfortunately her breast cancer diagnosis and treatment history was not available for review today. Apparently she had left mastectomy, and 4 cycles of adjuvant chemotherapy under Dr. Mariana Kaufman care at our cancer center. Patient does not recall the name of the chemotherapy medication. She was also offered a clinical trial of bone marrow transplant for her breast cancer and she declined. She was followed by January mammograms and had no evidence of recurrence. Her last screening mammogram was in 2009 which was benign.  She noticed a lump in her right breast about 20 weeks ago. It was hard and the tender on palpitation. She denies any skin change or nipple discharge. She has no appetite change, weight loss, or other new symptoms. She had underwent a diagnostic mammogram on 01/12/2014, which showed a 3.6 cm lobulated solid mass in right upper quadrant of right breast, and an oval axillary lymph nodes was cortical thickening. 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 Her right axillary lymph node biopsy also showed  invasive adenocarcinoma."  Her subsequent history is as detailed below  INTERVAL HISTORY: Danielle Oliver returns today for follow-up of her stage IV estrogen receptor positive breast cancer, accompanied by her daughter, who is out of school because of I some the [place]. Danielle Oliver is on denosumab/Xgeva monthly, but she actually received no treatments between September and December. She tells me that she tried to call to reschedule these but that she never got a return call.  However that may be, her CA-27-29 has been rising. She became alarmed that this and came in for a dose 02/23/2016. She had no side effects from this. We are just restage her with a chest CT scan and a bone scan. The chest CT unfortunately shows no visceral disease. The bone scan shows increased tentative anti-in multiple areas. Her CA-27-29 also has risen further.  On the plus side she is tolerating the palbociclib and anastrozole with no side effects that she is aware of other than nighttime hot flashes which occasionally wake her up  REVIEW OF SYSTEMS: Danielle Oliver is not exercising regularly. She does work full time. She denies unusual headaches, visual changes, nausea, vomiting, cough, phlegm production, pleurisy, shortness of breath, or any change in bowel or bladder habits. A detailed review of systems today was otherwise stable.  PAST MEDICAL HISTORY: Past Medical History:  Diagnosis Date  . Arthritis   . Breast cancer ([place])   . Dysrhythmia   . Gout   . Heart murmur    one Dr told her, "nothing to worry about"  . Hypertension   . Morbid obesity with body mass index of 50.0-59.9 in adult [facility])   . Pneumonia 2013 ish  . Shortness of breath  dyspnea    With exertion    PAST SURGICAL HISTORY: Past Surgical History:  Procedure Laterality Date  . BREAST LUMPECTOMY WITH RADIOACTIVE SEED LOCALIZATION Right 02/17/2015   Procedure: RIGHT BREAST LUMPECTOMY WITH RADIOACTIVE SEED LOCALIZATION;  Surgeon: Alphonsa Overall, MD;  Location: [facility];  Service: General;  Laterality: Right;  . COLONOSCOPY    . FRACTURE SURGERY Right   . MASTECTOMY Left 1992  . TONSILLECTOMY      FAMILY HISTORY Family History  Problem Relation Age of Onset  . Colon cancer Father    the patient's father died at 65 from colon cancer which was diagnosed shortly before his death. The patient's mother is living, age 41 as of July 2016. The patient is an only child. The only other cancer in the family to her knowledge is a paternal great aunt who developed breast cancer in her late 30s. There is no history of ovarian cancer and no other history of colon cancer in the family to her knowledge  GYNECOLOGIC HISTORY:  No LMP recorded. Patient is postmenopausal. menarche age 9 first live birth age 74 the patient is [place] P3. She stopped having periods in 2013. She did not take hormone replacement.   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 She is divorced. At home is just she and her son [name], 25 years old. Son [name], 27, lives in [name] where he works as a Fish farm manager. Son [name], 25, lives in [place] and is working on a Oceanographer in counseling at [employer]. The patient has one granddaughter, [name], in [name]. She is not a church attender    ADVANCED DIRECTIVES: Not in place   HEALTH MAINTENANCE: Social History  Substance Use Topics  . Smoking status: Never Smoker  . Smokeless tobacco: Not on file  . Alcohol use No     Comment: rare     Colonoscopy: remote/ [name]  PAP: 2014  Bone density:  Lipid panel:  Allergies  Allergen Reactions  . Iodinated Diagnostic Agents Anaphylaxis  . Penicillins Rash    Has patient had a PCN reaction causing immediate rash, facial/tongue/throat swelling, SOB or lightheadedness with hypotension: Yes  Has patient had a PCN reaction causing severe rash involving mucus membranes or skin necrosis: No Has patient had a PCN reaction that required hospitalization No Has  patient had a PCN reaction occurring within the last 10 years: Yes If all of the above answers are "NO", then may proceed with Cephalosporin use.    Current Outpatient Prescriptions  Medication Sig Dispense Refill  . amLODipine-olmesartan (AZOR) 10-40 MG tablet Take 1 tablet by mouth daily. 90 tablet 4  . anastrozole (ARIMIDEX) 1 MG tablet Take 1 tablet (1 mg total) by mouth daily. 90 tablet 3  . Calcium Carb-Cholecalciferol (CALTRATE 600+D) 600-800 MG-UNIT TABS Take by mouth 2 (two) times daily. 60 tablet   . ibuprofen (ADVIL,MOTRIN) 200 MG tablet Take 200-400 mg by mouth daily as needed. Reported on 05/22/2015    . lovastatin (MEVACOR) 40 MG tablet Take 40 mg by mouth daily.    . palbociclib (IBRANCE) 100 MG capsule Take 1 capsule (100 mg total) by mouth daily with breakfast. 21 capsule 0  . predniSONE (DELTASONE) 50 MG tablet Pt to take 1 tab 13 hours, 7 hours, & 1 hour prior to radiology procedure. 3 tablet 0   No current facility-administered medications for this visit.     OBJECTIVE:Morbidly obese African-American woman In no acute distress Vitals:  03/07/16 0914  BP: (!) 144/57  Pulse: 94  Resp: 20  Temp: 98.1 F (36.7 C)     Body mass index is 57.07 kg/m.    ECOG FS:1 - Symptomatic but completely ambulatory  Sclerae unicteric, EOMs intact Oropharynx clear and moist No cervical or supraclavicular adenopathy Lungs no rales or rhonchi Heart regular rate and rhythm Abd soft, nontender, positive bowel sounds MSK no focal spinal tenderness, no upper extremity lymphedema Neuro: nonfocal, well oriented, appropriate affect Breasts: Deferred   LAB RESULTS:  CMP     Component Value Date/Time   NA 140 03/07/2016 0856   K 3.7 03/07/2016 0856   CL 110 02/16/2015 1152   CO2 24 03/07/2016 0856   GLUCOSE 104 03/07/2016 0856   BUN 14.3 03/07/2016 0856   CREATININE 1.0 03/07/2016 0856   CALCIUM 9.3 03/07/2016 0856   PROT 7.4 03/07/2016 0856   ALBUMIN 3.6 03/07/2016 0856    AST 17 03/07/2016 0856   ALT 17 03/07/2016 0856   ALKPHOS 107 03/07/2016 0856   BILITOT 1.01 03/07/2016 0856   GFRNONAA >60 02/16/2015 1152   GFRAA >60 02/16/2015 1152     INo results found for: SPEP, UPEP  Lab Results  Component Value Date   WBC 7.7 03/07/2016   NEUTROABS 4.9 03/07/2016   HGB 12.8 03/07/2016   HCT 37.8 03/07/2016   MCV 97.2 03/07/2016   PLT 277 03/07/2016      Chemistry      Component Value Date/Time   NA 140 03/07/2016 0856   K 3.7 03/07/2016 0856   CL 110 02/16/2015 1152   CO2 24 03/07/2016 0856   BUN 14.3 03/07/2016 0856   CREATININE 1.0 03/07/2016 0856      Component Value Date/Time   CALCIUM 9.3 03/07/2016 0856   ALKPHOS 107 03/07/2016 0856   AST 17 03/07/2016 0856   ALT 17 03/07/2016 0856   BILITOT 1.01 03/07/2016 0856       Lab Results  Component Value Date   LABCA2 85 (H) 05/22/2015    No components found for: [id number]  No results for input(s): INR in the last 168 hours.  Urinalysis    Component Value Date/Time   LABSPEC 1.015 04/27/2007 1654   PHURINE 6.0 04/27/2007 1654   GLUCOSEU NEGATIVE 04/27/2007 1654   HGBUR NEGATIVE 04/27/2007 1654   BILIRUBINUR NEGATIVE 04/27/2007 1654   KETONESUR NEGATIVE 04/27/2007 1654   PROTEINUR NEGATIVE 04/27/2007 1654   UROBILINOGEN 0.2 04/27/2007 1654   NITRITE NEGATIVE 04/27/2007 1654   LEUKOCYTESUR  04/27/2007 1654    NEGATIVE Biochemical Testing Only. Please order routine urinalysis from main lab if confirmatory testing is needed.   STUDIES: Ct Chest W Contrast  Result Date: 02/29/2016 CLINICAL DATA:  Restaging breast cancer.  LEFT mastectomy 1991. EXAM: CT CHEST WITH CONTRAST TECHNIQUE: Multidetector CT imaging of the chest was performed during intravenous contrast administration. CONTRAST:  53m ISOVUE-300 IOPAMIDOL (ISOVUE-300) INJECTION 61% COMPARISON:  CT chest 11/21/2015, bone scan 02/28/2016 FINDINGS: Cardiovascular: No significant vascular findings. Normal heart size. No  pericardial effusion. Mediastinum/Nodes: No axillary supraclavicular adenopathy. Postsurgical scar of the RIGHT axilla. Post LEFT mastectomy. Lungs/Pleura:  No suspicious pulmonary nodule.  Airways normal Upper Abdomen: Limited view of the liver, kidneys, pancreas are unremarkable. Normal adrenal glands. Gallstone noted Musculoskeletal: Diffuse sclerotic metastasis throughout the spine unchanged prior. IMPRESSION: 1. No evidence of breast cancer recurrence or progression. 2. Diffuse sclerotic metastasis unchanged. Electronically Signed   By: SSuzy BouchardM.D.   On: 02/29/2016 15:35  Nm Bone Scan Whole Body  Result Date: 02/28/2016 CLINICAL DATA:  Breast cancer. EXAM: NUCLEAR MEDICINE WHOLE BODY BONE SCAN TECHNIQUE: Whole body anterior and posterior images were obtained approximately 3 hours after intravenous injection of radiopharmaceutical. RADIOPHARMACEUTICALS:  20.8 mCi Technetium-20mMDP IV COMPARISON:  07/21/2015. FINDINGS: Bilateral renal function and excretion. Mild increased activity noted in the mid and distal left femur, right proximal femur, right sacrum, mid thoracic vertebral body. These findings could be secondary to metastatic disease and standard bilateral femur series, thoracic spine series, and sacral series suggested to further evaluate. Very mild multifocal areas of increased activity of the skull. Skull series suggested for further evaluation. Increased activity again noted over both proximal humeri, most likely degenerative. Increased activity over the right humerus from prior exam is however present. Right shoulder series suggest for further evaluation. Increased activity noted over the left lateral second and seventh ribs. Left rib series can be obtained for further evaluation. Increased activity again noted over both knees and feet most likely degenerative. IMPRESSION: 1. Multifocal areas of increased activity noted over the mid and distal left femur, proximal right femur, right  sacral, mid thoracic vertebral body, left second and seventh posterolateral ribs, and the skull. Plain film series of these regions can be obtained further evaluation. Findings suspicious for metastatic disease . 2. Bilateral increased activity noted over both shoulders most likely degenerative. However activity noted over the right shoulder/proximal humerus has increased from prior exam and right shoulder series suggested for further evaluation . 3. Remaining multifocal areas increased activity most likely from degenerative change. Electronically Signed   By: TMarcello Moores Register   On: 02/28/2016 15:20    ASSESSMENT: 56y.o. [place] woman  (1) status post left mastectomy in 1991 followed by adjuvant chemotherapy Consisting of doxorubicin and cyclophosphamide given every 21 days 4 (records under "media")  (2) status post right breast upper outer quadrant biopsy 01/13/2014 for a  cT2 p N1, stage IIB invasive ductal carcinoma, estrogen and progesterone receptor positive, HER-2 negative, with an MIB-1 of 10%.  METASTATIC DISEASE: CT scan 03/18/2014 shows multiple sclerotic bone mets but no visceral disease  (1) no bone biopsy obtained to this point  (b) CA 27-29 is informative  (3) anastrozole started January 2016  (a)  palbociclib added 11/21/2014 at 125 mg daily, 21/7  (b) Palbociclib dose decreased to 100 mg daily, 21/ 7, with cycle 2, starting 12/21/2014  (4) vitamin D deficiency: On replacement  (5) status post right lumpectomy 02/17/2015 for a pT2 pNX invasive ductal carcinoma, grade 1, estrogen receptor 100% positive, progesterone receptor 20% positive, HER-2 not amplified. Margins were negative  (a) radiation therapy to lumpectomy site declined by patient  (6) denosumab/ Xgeva started 12/15/2014, repeated monthly  (7) genetics testing offered. Patient still deciding   PLAN: ABayleighis now 2 years out from the finding of metastatic breast cancer area it is favorable that we still do  not see any evidence of visceral disease.  However her bone scan is much more active than her prior bone scan 6 months ago. Sometimes this can be evidence of healing, but with a significant increase in her CA-27-29 I think this is a true sign of progression. The fact that she is having pain in her right humerus, where she is known to have a spot, also is consistent with that  We spent approximately 40 minutes going over her situation. We reviewed the fact that she has been receiving her Xgeva very irregularly. I have urged her  to make sure to be here every 4 weeks to get the [facility] and if she cannot make it for 1 weeks for another to make sure to reschedule it. She does tell me that she has made multiple call that have not been answered and our phone system right now really doesn't leave much to be desired. However that may be, the important point is: She needs to get the Xgeva every 4 weeks.  If after 3 more months on this treatment we see no evidence of response, we will change her treatment and in particular switch from Xgeva 2 zolendronate.  I offered her referral to radiation oncology to irradiate the right humeral area but she declines at present. She also still declines referral to genetics.  Otherwise she is tolerating the palbociclib well, and she is continuing at the current dose through the current cycle. She is also doing well with the aromatase inhibitor. She refuses gabapentin although she has had on hand, and though she understands this would likely be helpful for her nighttime hot flashes.  She is going to see me again late March. If we don't see a significant turnaround by then she will be completely restaged and we will consider changing her treatments.   Chauncey Cruel, MD Dr. Jana Hakim yes okay. You get your blood but sure to get good blood return please fax

## 2016-03-08 LAB — CANCER ANTIGEN 27.29: CA 27.29: 686.8 U/mL — ABNORMAL HIGH (ref 0.0–38.6)

## 2016-03-22 ENCOUNTER — Ambulatory Visit (HOSPITAL_BASED_OUTPATIENT_CLINIC_OR_DEPARTMENT_OTHER): Payer: BC Managed Care – PPO

## 2016-03-22 ENCOUNTER — Other Ambulatory Visit (HOSPITAL_BASED_OUTPATIENT_CLINIC_OR_DEPARTMENT_OTHER): Payer: BC Managed Care – PPO

## 2016-03-22 ENCOUNTER — Telehealth: Payer: Self-pay | Admitting: Oncology

## 2016-03-22 VITALS — BP 153/85 | HR 100 | Temp 97.5°F | Resp 18

## 2016-03-22 DIAGNOSIS — C50911 Malignant neoplasm of unspecified site of right female breast: Secondary | ICD-10-CM

## 2016-03-22 DIAGNOSIS — C7951 Secondary malignant neoplasm of bone: Secondary | ICD-10-CM

## 2016-03-22 DIAGNOSIS — C50919 Malignant neoplasm of unspecified site of unspecified female breast: Secondary | ICD-10-CM

## 2016-03-22 DIAGNOSIS — C50411 Malignant neoplasm of upper-outer quadrant of right female breast: Secondary | ICD-10-CM | POA: Diagnosis not present

## 2016-03-22 LAB — CBC WITH DIFFERENTIAL/PLATELET
BASO%: 2.1 % — AB (ref 0.0–2.0)
Basophils Absolute: 0.1 10*3/uL (ref 0.0–0.1)
EOS%: 3.1 % (ref 0.0–7.0)
Eosinophils Absolute: 0.1 10*3/uL (ref 0.0–0.5)
HCT: 38.4 % (ref 34.8–46.6)
HGB: 12.8 g/dL (ref 11.6–15.9)
LYMPH%: 38.4 % (ref 14.0–49.7)
MCH: 32.6 pg (ref 25.1–34.0)
MCHC: 33.3 g/dL (ref 31.5–36.0)
MCV: 97.7 fL (ref 79.5–101.0)
MONO#: 0.2 10*3/uL (ref 0.1–0.9)
MONO%: 5 % (ref 0.0–14.0)
NEUT%: 51.4 % (ref 38.4–76.8)
NEUTROS ABS: 2.2 10*3/uL (ref 1.5–6.5)
PLATELETS: 284 10*3/uL (ref 145–400)
RBC: 3.93 10*6/uL (ref 3.70–5.45)
RDW: 15 % — ABNORMAL HIGH (ref 11.2–14.5)
WBC: 4.2 10*3/uL (ref 3.9–10.3)
lymph#: 1.6 10*3/uL (ref 0.9–3.3)

## 2016-03-22 MED ORDER — DENOSUMAB 120 MG/1.7ML ~~LOC~~ SOLN
120.0000 mg | Freq: Once | SUBCUTANEOUS | Status: AC
  Administered 2016-03-22: 120 mg via SUBCUTANEOUS
  Filled 2016-03-22: qty 1.7

## 2016-03-22 NOTE — Patient Instructions (Signed)
Denosumab injection  What is this medicine?  DENOSUMAB (den oh sue mab) slows bone breakdown. Prolia is used to treat osteoporosis in women after menopause and in men. Xgeva is used to prevent bone fractures and other bone problems caused by cancer bone metastases. Xgeva is also used to treat giant cell tumor of the bone.  This medicine may be used for other purposes; ask your health care provider or pharmacist if you have questions.  What should I tell my health care provider before I take this medicine?  They need to know if you have any of these conditions:  -dental disease  -eczema  -infection or history of infections  -kidney disease or on dialysis  -low blood calcium or vitamin D  -malabsorption syndrome  -scheduled to have surgery or tooth extraction  -taking medicine that contains denosumab  -thyroid or parathyroid disease  -an unusual reaction to denosumab, other medicines, foods, dyes, or preservatives  -pregnant or trying to get pregnant  -breast-feeding  How should I use this medicine?  This medicine is for injection under the skin. It is given by a health care professional in a hospital or clinic setting.  If you are getting Prolia, a special MedGuide will be given to you by the pharmacist with each prescription and refill. Be sure to read this information carefully each time.  For Prolia, talk to your pediatrician regarding the use of this medicine in children. Special care may be needed. For Xgeva, talk to your pediatrician regarding the use of this medicine in children. While this drug may be prescribed for children as young as 13 years for selected conditions, precautions do apply.  Overdosage: If you think you have taken too much of this medicine contact a poison control center or emergency room at once.  NOTE: This medicine is only for you. Do not share this medicine with others.  What if I miss a dose?  It is important not to miss your dose. Call your doctor or health care professional if you are  unable to keep an appointment.  What may interact with this medicine?  Do not take this medicine with any of the following medications:  -other medicines containing denosumab  This medicine may also interact with the following medications:  -medicines that suppress the immune system  -medicines that treat cancer  -steroid medicines like prednisone or cortisone  This list may not describe all possible interactions. Give your health care provider a list of all the medicines, herbs, non-prescription drugs, or dietary supplements you use. Also tell them if you smoke, drink alcohol, or use illegal drugs. Some items may interact with your medicine.  What should I watch for while using this medicine?  Visit your doctor or health care professional for regular checks on your progress. Your doctor or health care professional may order blood tests and other tests to see how you are doing.  Call your doctor or health care professional if you get a cold or other infection while receiving this medicine. Do not treat yourself. This medicine may decrease your body's ability to fight infection.  You should make sure you get enough calcium and vitamin D while you are taking this medicine, unless your doctor tells you not to. Discuss the foods you eat and the vitamins you take with your health care professional.  See your dentist regularly. Brush and floss your teeth as directed. Before you have any dental work done, tell your dentist you are receiving this medicine.  Do   not become pregnant while taking this medicine or for 5 months after stopping it. Women should inform their doctor if they wish to become pregnant or think they might be pregnant. There is a potential for serious side effects to an unborn child. Talk to your health care professional or pharmacist for more information.  What side effects may I notice from receiving this medicine?  Side effects that you should report to your doctor or health care professional as soon as  possible:  -allergic reactions like skin rash, itching or hives, swelling of the face, lips, or tongue  -breathing problems  -chest pain  -fast, irregular heartbeat  -feeling faint or lightheaded, falls  -fever, chills, or any other sign of infection  -muscle spasms, tightening, or twitches  -numbness or tingling  -skin blisters or bumps, or is dry, peels, or red  -slow healing or unexplained pain in the mouth or jaw  -unusual bleeding or bruising  Side effects that usually do not require medical attention (Report these to your doctor or health care professional if they continue or are bothersome.):  -muscle pain  -stomach upset, gas  This list may not describe all possible side effects. Call your doctor for medical advice about side effects. You may report side effects to FDA at 1-800-FDA-1088.  Where should I keep my medicine?  This medicine is only given in a clinic, doctor's office, or other health care setting and will not be stored at home.  NOTE: This sheet is a summary. It may not cover all possible information. If you have questions about this medicine, talk to your doctor, pharmacist, or health care provider.      2016, [employer] Standard. (2011-08-19 12:37:47)

## 2016-03-22 NOTE — Telephone Encounter (Signed)
Appointment rescheduled per patient request. 03/22/16

## 2016-03-23 ENCOUNTER — Other Ambulatory Visit: Payer: Self-pay | Admitting: Oncology

## 2016-03-23 LAB — CANCER ANTIGEN 27.29: CA 27.29: 725.3 U/mL — ABNORMAL HIGH (ref 0.0–38.6)

## 2016-04-09 ENCOUNTER — Other Ambulatory Visit: Payer: Self-pay | Admitting: *Deleted

## 2016-04-19 ENCOUNTER — Other Ambulatory Visit: Payer: BC Managed Care – PPO

## 2016-04-19 ENCOUNTER — Ambulatory Visit: Payer: BC Managed Care – PPO

## 2016-04-22 ENCOUNTER — Other Ambulatory Visit: Payer: Self-pay | Admitting: *Deleted

## 2016-04-22 ENCOUNTER — Other Ambulatory Visit (HOSPITAL_BASED_OUTPATIENT_CLINIC_OR_DEPARTMENT_OTHER): Payer: BC Managed Care – PPO

## 2016-04-22 ENCOUNTER — Ambulatory Visit (HOSPITAL_BASED_OUTPATIENT_CLINIC_OR_DEPARTMENT_OTHER): Payer: BC Managed Care – PPO

## 2016-04-22 VITALS — BP 158/85 | HR 89 | Temp 97.8°F | Resp 20

## 2016-04-22 DIAGNOSIS — C50911 Malignant neoplasm of unspecified site of right female breast: Secondary | ICD-10-CM

## 2016-04-22 DIAGNOSIS — C7951 Secondary malignant neoplasm of bone: Secondary | ICD-10-CM

## 2016-04-22 DIAGNOSIS — C50411 Malignant neoplasm of upper-outer quadrant of right female breast: Secondary | ICD-10-CM

## 2016-04-22 DIAGNOSIS — C50919 Malignant neoplasm of unspecified site of unspecified female breast: Secondary | ICD-10-CM

## 2016-04-22 LAB — CBC WITH DIFFERENTIAL/PLATELET
BASO%: 0.3 % (ref 0.0–2.0)
Basophils Absolute: 0 10*3/uL (ref 0.0–0.1)
EOS ABS: 0.1 10*3/uL (ref 0.0–0.5)
EOS%: 2.1 % (ref 0.0–7.0)
HCT: 37.8 % (ref 34.8–46.6)
HEMOGLOBIN: 13 g/dL (ref 11.6–15.9)
LYMPH%: 24.7 % (ref 14.0–49.7)
MCH: 33.9 pg (ref 25.1–34.0)
MCHC: 34.4 g/dL (ref 31.5–36.0)
MCV: 98.6 fL (ref 79.5–101.0)
MONO#: 0.4 10*3/uL (ref 0.1–0.9)
MONO%: 8.4 % (ref 0.0–14.0)
NEUT%: 64.5 % (ref 38.4–76.8)
NEUTROS ABS: 3.2 10*3/uL (ref 1.5–6.5)
Platelets: 279 10*3/uL (ref 145–400)
RBC: 3.83 10*6/uL (ref 3.70–5.45)
RDW: 16.4 % — AB (ref 11.2–14.5)
WBC: 5 10*3/uL (ref 3.9–10.3)
lymph#: 1.2 10*3/uL (ref 0.9–3.3)

## 2016-04-22 LAB — COMPREHENSIVE METABOLIC PANEL
ALK PHOS: 130 U/L (ref 40–150)
ALT: 17 U/L (ref 0–55)
AST: 16 U/L (ref 5–34)
Albumin: 3.8 g/dL (ref 3.5–5.0)
Anion Gap: 10 mEq/L (ref 3–11)
BILIRUBIN TOTAL: 0.92 mg/dL (ref 0.20–1.20)
BUN: 15.4 mg/dL (ref 7.0–26.0)
CO2: 25 meq/L (ref 22–29)
CREATININE: 1 mg/dL (ref 0.6–1.1)
Calcium: 9.1 mg/dL (ref 8.4–10.4)
Chloride: 107 mEq/L (ref 98–109)
EGFR: 70 mL/min/{1.73_m2} — ABNORMAL LOW (ref >90)
Glucose: 104 mg/dl (ref 70–140)
Potassium: 4.3 mEq/L (ref 3.5–5.1)
Sodium: 143 mEq/L (ref 136–145)
Total Protein: 7.3 g/dL (ref 6.4–8.3)

## 2016-04-22 MED ORDER — DENOSUMAB 120 MG/1.7ML ~~LOC~~ SOLN
120.0000 mg | Freq: Once | SUBCUTANEOUS | Status: AC
  Administered 2016-04-22: 120 mg via SUBCUTANEOUS
  Filled 2016-04-22: qty 1.7

## 2016-04-22 NOTE — Patient Instructions (Signed)
Denosumab injection  What is this medicine?  DENOSUMAB (den oh sue mab) slows bone breakdown. Prolia is used to treat osteoporosis in women after menopause and in men. Xgeva is used to prevent bone fractures and other bone problems caused by cancer bone metastases. Xgeva is also used to treat giant cell tumor of the bone.  This medicine may be used for other purposes; ask your health care provider or pharmacist if you have questions.  What should I tell my health care provider before I take this medicine?  They need to know if you have any of these conditions:  -dental disease  -eczema  -infection or history of infections  -kidney disease or on dialysis  -low blood calcium or vitamin D  -malabsorption syndrome  -scheduled to have surgery or tooth extraction  -taking medicine that contains denosumab  -thyroid or parathyroid disease  -an unusual reaction to denosumab, other medicines, foods, dyes, or preservatives  -pregnant or trying to get pregnant  -breast-feeding  How should I use this medicine?  This medicine is for injection under the skin. It is given by a health care professional in a hospital or clinic setting.  If you are getting Prolia, a special MedGuide will be given to you by the pharmacist with each prescription and refill. Be sure to read this information carefully each time.  For Prolia, talk to your pediatrician regarding the use of this medicine in children. Special care may be needed. For Xgeva, talk to your pediatrician regarding the use of this medicine in children. While this drug may be prescribed for children as young as 13 years for selected conditions, precautions do apply.  Overdosage: If you think you have taken too much of this medicine contact a poison control center or emergency room at once.  NOTE: This medicine is only for you. Do not share this medicine with others.  What if I miss a dose?  It is important not to miss your dose. Call your doctor or health care professional if you are  unable to keep an appointment.  What may interact with this medicine?  Do not take this medicine with any of the following medications:  -other medicines containing denosumab  This medicine may also interact with the following medications:  -medicines that suppress the immune system  -medicines that treat cancer  -steroid medicines like prednisone or cortisone  This list may not describe all possible interactions. Give your health care provider a list of all the medicines, herbs, non-prescription drugs, or dietary supplements you use. Also tell them if you smoke, drink alcohol, or use illegal drugs. Some items may interact with your medicine.  What should I watch for while using this medicine?  Visit your doctor or health care professional for regular checks on your progress. Your doctor or health care professional may order blood tests and other tests to see how you are doing.  Call your doctor or health care professional if you get a cold or other infection while receiving this medicine. Do not treat yourself. This medicine may decrease your body's ability to fight infection.  You should make sure you get enough calcium and vitamin D while you are taking this medicine, unless your doctor tells you not to. Discuss the foods you eat and the vitamins you take with your health care professional.  See your dentist regularly. Brush and floss your teeth as directed. Before you have any dental work done, tell your dentist you are receiving this medicine.  Do   not become pregnant while taking this medicine or for 5 months after stopping it. Women should inform their doctor if they wish to become pregnant or think they might be pregnant. There is a potential for serious side effects to an unborn child. Talk to your health care professional or pharmacist for more information.  What side effects may I notice from receiving this medicine?  Side effects that you should report to your doctor or health care professional as soon as  possible:  -allergic reactions like skin rash, itching or hives, swelling of the face, lips, or tongue  -breathing problems  -chest pain  -fast, irregular heartbeat  -feeling faint or lightheaded, falls  -fever, chills, or any other sign of infection  -muscle spasms, tightening, or twitches  -numbness or tingling  -skin blisters or bumps, or is dry, peels, or red  -slow healing or unexplained pain in the mouth or jaw  -unusual bleeding or bruising  Side effects that usually do not require medical attention (Report these to your doctor or health care professional if they continue or are bothersome.):  -muscle pain  -stomach upset, gas  This list may not describe all possible side effects. Call your doctor for medical advice about side effects. You may report side effects to FDA at 1-800-FDA-1088.  Where should I keep my medicine?  This medicine is only given in a clinic, doctor's office, or other health care setting and will not be stored at home.  NOTE: This sheet is a summary. It may not cover all possible information. If you have questions about this medicine, talk to your doctor, pharmacist, or health care provider.      2016, [employer] Standard. (2011-08-19 12:37:47)

## 2016-04-23 LAB — CANCER ANTIGEN 27.29: CA 27.29: 677.7 U/mL — ABNORMAL HIGH (ref 0.0–38.6)

## 2016-04-26 ENCOUNTER — Other Ambulatory Visit: Payer: Self-pay | Admitting: *Deleted

## 2016-04-26 DIAGNOSIS — C50919 Malignant neoplasm of unspecified site of unspecified female breast: Secondary | ICD-10-CM

## 2016-04-26 DIAGNOSIS — C7951 Secondary malignant neoplasm of bone: Principal | ICD-10-CM

## 2016-04-26 MED ORDER — PALBOCICLIB 100 MG PO CAPS: 100.0000 mg | ORAL_CAPSULE | Freq: Every day | ORAL | 0 refills | Status: DC

## 2016-04-29 ENCOUNTER — Other Ambulatory Visit: Payer: Self-pay | Admitting: *Deleted

## 2016-05-24 ENCOUNTER — Ambulatory Visit (HOSPITAL_BASED_OUTPATIENT_CLINIC_OR_DEPARTMENT_OTHER): Payer: BC Managed Care – PPO

## 2016-05-24 ENCOUNTER — Encounter: Payer: Self-pay | Admitting: Oncology

## 2016-05-24 ENCOUNTER — Ambulatory Visit (HOSPITAL_BASED_OUTPATIENT_CLINIC_OR_DEPARTMENT_OTHER): Payer: BC Managed Care – PPO | Admitting: Oncology

## 2016-05-24 ENCOUNTER — Other Ambulatory Visit (HOSPITAL_BASED_OUTPATIENT_CLINIC_OR_DEPARTMENT_OTHER): Payer: BC Managed Care – PPO

## 2016-05-24 VITALS — BP 141/93 | HR 92 | Temp 98.0°F | Resp 17 | Ht 67.0 in | Wt 365.4 lb

## 2016-05-24 DIAGNOSIS — C7951 Secondary malignant neoplasm of bone: Secondary | ICD-10-CM

## 2016-05-24 DIAGNOSIS — C50919 Malignant neoplasm of unspecified site of unspecified female breast: Secondary | ICD-10-CM

## 2016-05-24 DIAGNOSIS — C50411 Malignant neoplasm of upper-outer quadrant of right female breast: Secondary | ICD-10-CM

## 2016-05-24 DIAGNOSIS — E559 Vitamin D deficiency, unspecified: Secondary | ICD-10-CM | POA: Diagnosis not present

## 2016-05-24 DIAGNOSIS — C50911 Malignant neoplasm of unspecified site of right female breast: Secondary | ICD-10-CM

## 2016-05-24 DIAGNOSIS — Z79811 Long term (current) use of aromatase inhibitors: Secondary | ICD-10-CM

## 2016-05-24 DIAGNOSIS — Z170 Estrogen receptor positive status [ER+]: Secondary | ICD-10-CM

## 2016-05-24 LAB — COMPREHENSIVE METABOLIC PANEL WITH GFR
ALT: 19 U/L (ref 0–55)
AST: 16 U/L (ref 5–34)
Albumin: 3.7 g/dL (ref 3.5–5.0)
Alkaline Phosphatase: 120 U/L (ref 40–150)
Anion Gap: 10 meq/L (ref 3–11)
BUN: 11.7 mg/dL (ref 7.0–26.0)
CO2: 26 meq/L (ref 22–29)
Calcium: 9.8 mg/dL (ref 8.4–10.4)
Chloride: 106 meq/L (ref 98–109)
Creatinine: 1 mg/dL (ref 0.6–1.1)
EGFR: 74 ml/min/1.73 m2 — ABNORMAL LOW
Glucose: 102 mg/dL (ref 70–140)
Potassium: 3.9 meq/L (ref 3.5–5.1)
Sodium: 142 meq/L (ref 136–145)
Total Bilirubin: 1.01 mg/dL (ref 0.20–1.20)
Total Protein: 7.5 g/dL (ref 6.4–8.3)

## 2016-05-24 LAB — CBC WITH DIFFERENTIAL/PLATELET
BASO%: 1.7 % (ref 0.0–2.0)
Basophils Absolute: 0.1 10e3/uL (ref 0.0–0.1)
EOS%: 2.9 % (ref 0.0–7.0)
Eosinophils Absolute: 0.1 10e3/uL (ref 0.0–0.5)
HCT: 37 % (ref 34.8–46.6)
HGB: 12.4 g/dL (ref 11.6–15.9)
LYMPH%: 35 % (ref 14.0–49.7)
MCH: 33.4 pg (ref 25.1–34.0)
MCHC: 33.5 g/dL (ref 31.5–36.0)
MCV: 99.7 fL (ref 79.5–101.0)
MONO#: 0.2 10e3/uL (ref 0.1–0.9)
MONO%: 5.6 % (ref 0.0–14.0)
NEUT#: 2.3 10e3/uL (ref 1.5–6.5)
NEUT%: 54.8 % (ref 38.4–76.8)
Platelets: 264 10e3/uL (ref 145–400)
RBC: 3.71 10e6/uL (ref 3.70–5.45)
RDW: 14.8 % — ABNORMAL HIGH (ref 11.2–14.5)
WBC: 4.1 10e3/uL (ref 3.9–10.3)
lymph#: 1.4 10e3/uL (ref 0.9–3.3)

## 2016-05-24 MED ORDER — AMLODIPINE-OLMESARTAN 10-40 MG PO TABS: 1.0000 | ORAL_TABLET | Freq: Every day | ORAL | 4 refills | Status: AC

## 2016-05-24 MED ORDER — ANASTROZOLE 1 MG PO TABS: 1.0000 mg | ORAL_TABLET | Freq: Every day | ORAL | 3 refills | Status: DC

## 2016-05-24 MED ORDER — DENOSUMAB 120 MG/1.7ML ~~LOC~~ SOLN
120.0000 mg | Freq: Once | SUBCUTANEOUS | Status: AC
  Administered 2016-05-24: 120 mg via SUBCUTANEOUS
  Filled 2016-05-24: qty 1.7

## 2016-05-24 NOTE — Patient Instructions (Signed)
Denosumab injection  What is this medicine?  DENOSUMAB (den oh sue mab) slows bone breakdown. Prolia is used to treat osteoporosis in women after menopause and in men. Xgeva is used to prevent bone fractures and other bone problems caused by cancer bone metastases. Xgeva is also used to treat giant cell tumor of the bone.  This medicine may be used for other purposes; ask your health care provider or pharmacist if you have questions.  What should I tell my health care provider before I take this medicine?  They need to know if you have any of these conditions:  -dental disease  -eczema  -infection or history of infections  -kidney disease or on dialysis  -low blood calcium or vitamin D  -malabsorption syndrome  -scheduled to have surgery or tooth extraction  -taking medicine that contains denosumab  -thyroid or parathyroid disease  -an unusual reaction to denosumab, other medicines, foods, dyes, or preservatives  -pregnant or trying to get pregnant  -breast-feeding  How should I use this medicine?  This medicine is for injection under the skin. It is given by a health care professional in a hospital or clinic setting.  If you are getting Prolia, a special MedGuide will be given to you by the pharmacist with each prescription and refill. Be sure to read this information carefully each time.  For Prolia, talk to your pediatrician regarding the use of this medicine in children. Special care may be needed. For Xgeva, talk to your pediatrician regarding the use of this medicine in children. While this drug may be prescribed for children as young as 13 years for selected conditions, precautions do apply.  Overdosage: If you think you have taken too much of this medicine contact a poison control center or emergency room at once.  NOTE: This medicine is only for you. Do not share this medicine with others.  What if I miss a dose?  It is important not to miss your dose. Call your doctor or health care professional if you are  unable to keep an appointment.  What may interact with this medicine?  Do not take this medicine with any of the following medications:  -other medicines containing denosumab  This medicine may also interact with the following medications:  -medicines that suppress the immune system  -medicines that treat cancer  -steroid medicines like prednisone or cortisone  This list may not describe all possible interactions. Give your health care provider a list of all the medicines, herbs, non-prescription drugs, or dietary supplements you use. Also tell them if you smoke, drink alcohol, or use illegal drugs. Some items may interact with your medicine.  What should I watch for while using this medicine?  Visit your doctor or health care professional for regular checks on your progress. Your doctor or health care professional may order blood tests and other tests to see how you are doing.  Call your doctor or health care professional if you get a cold or other infection while receiving this medicine. Do not treat yourself. This medicine may decrease your body's ability to fight infection.  You should make sure you get enough calcium and vitamin D while you are taking this medicine, unless your doctor tells you not to. Discuss the foods you eat and the vitamins you take with your health care professional.  See your dentist regularly. Brush and floss your teeth as directed. Before you have any dental work done, tell your dentist you are receiving this medicine.  Do   not become pregnant while taking this medicine or for 5 months after stopping it. Women should inform their doctor if they wish to become pregnant or think they might be pregnant. There is a potential for serious side effects to an unborn child. Talk to your health care professional or pharmacist for more information.  What side effects may I notice from receiving this medicine?  Side effects that you should report to your doctor or health care professional as soon as  possible:  -allergic reactions like skin rash, itching or hives, swelling of the face, lips, or tongue  -breathing problems  -chest pain  -fast, irregular heartbeat  -feeling faint or lightheaded, falls  -fever, chills, or any other sign of infection  -muscle spasms, tightening, or twitches  -numbness or tingling  -skin blisters or bumps, or is dry, peels, or red  -slow healing or unexplained pain in the mouth or jaw  -unusual bleeding or bruising  Side effects that usually do not require medical attention (Report these to your doctor or health care professional if they continue or are bothersome.):  -muscle pain  -stomach upset, gas  This list may not describe all possible side effects. Call your doctor for medical advice about side effects. You may report side effects to FDA at 1-800-FDA-1088.  Where should I keep my medicine?  This medicine is only given in a clinic, doctor's office, or other health care setting and will not be stored at home.  NOTE: This sheet is a summary. It may not cover all possible information. If you have questions about this medicine, talk to your doctor, pharmacist, or health care provider.      2016, [employer] Standard. (2011-08-19 12:37:47)

## 2016-05-24 NOTE — Progress Notes (Signed)
Visit  [place]  [phone] [phone]) [phone]   ID: Danielle Oliver DOB: 04-15-1960  MR#: [id number]  XVQ#:[id number]  Patient Care Team: Danielle Rima, MD as PCP - General (Family Medicine) Danielle Silversmith, MD (Inactive) as Consulting Physician (Radiation Oncology) Danielle Bouche, NP as Nurse Practitioner (Nurse Practitioner) Danielle Cruel, MD as Consulting Physician (Oncology) Danielle Overall, MD as Consulting Physician (General Surgery) PCP: Danielle Rima, MD GYN: OTHER MD: Danielle Heck MD  CHIEF COMPLAINT: Stage IV breast cancer  CURRENT TREATMENT: Anastrozole, palbociclib, denosumab   BREAST CANCER HISTORY: From Dr Danielle Oliver [facility] 03/11/2014 intake note:  "She was diagnosed with stage III left breast cancer in 1991 when she was 56 years old. Unfortunately her breast cancer diagnosis and treatment history was not available for review today. Apparently she had left mastectomy, and 4 cycles of adjuvant chemotherapy under Dr. Mariana Oliver care at our cancer center. Patient does not recall the name of the chemotherapy medication. She was also offered a clinical trial of bone marrow transplant for her breast cancer and she declined. She was followed by January mammograms and had no evidence of recurrence. Her last screening mammogram was in 2009 which was benign.  She noticed a lump in her right breast about 20 weeks ago. It was hard and the tender on palpitation. She denies any skin change or nipple discharge. She has no appetite change, weight loss, or other new symptoms. She had underwent a diagnostic mammogram on 01/12/2014, which showed a 3.6 cm lobulated solid mass in right upper quadrant of right breast, and an oval axillary lymph nodes was cortical thickening. 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 Her right axillary lymph node biopsy also  showed invasive adenocarcinoma."  Her subsequent history is as detailed below  INTERVAL HISTORY: Danielle Oliver returns today for follow-up of her stage IV estrogen receptor positive breast cancer. At her last visit with me she was having significant increase in her bony pain, and we slightly intensified her treatments. The main thing we did was make sure she received her Delton See on a regular basis. Because of work and other constraints she had been missing some doses  Simply by doing that she has greatly improved. The pain in her humerus has resolved. Her CA-27-29 has started to trend back down.  She is tolerating the anastrozole with no side effects that she is aware of. She does very well with the palbociclib also. The last couple of days of a 21 days of treatment she feels a little tired. She has no nausea associated with this. She tolerates the Xgeva with no side effects that she is aware of.   REVIEW OF SYSTEMS: Danielle Oliver is easily winded even when she walks on a flat surface. Of course she is carrying quite a bit of weight. Aside from that a detailed review of systems today was stable.  PAST MEDICAL HISTORY: Past Medical History:  Diagnosis Date  . Arthritis   . Breast cancer ([place])   . Dysrhythmia   . Gout   . Heart murmur    one Dr told her, "nothing to worry about"  . Hypertension   . Morbid obesity with body mass index of 50.0-59.9 in adult [facility])   . Pneumonia 2013 ish  . Shortness of breath dyspnea    With exertion    PAST SURGICAL HISTORY: Past Surgical History:  Procedure Laterality Date  .  BREAST LUMPECTOMY WITH RADIOACTIVE SEED LOCALIZATION Right 02/17/2015   Procedure: RIGHT BREAST LUMPECTOMY WITH RADIOACTIVE SEED LOCALIZATION;  Surgeon: Danielle Newman, MD;  Location: [facility] OR;  Service: General;  Laterality: Right;  . COLONOSCOPY    . FRACTURE SURGERY Right   . MASTECTOMY Left 1992  . TONSILLECTOMY      FAMILY HISTORY Family History  Problem Relation Age of Onset  . Colon  cancer Father    the patient's father died at 55 from colon cancer which was diagnosed shortly before his death. The patient's mother is living, age 73 as of July 2016. The patient is an only child. The only other cancer in the family to her knowledge is a paternal great aunt who developed breast cancer in her late 80s. There is no history of ovarian cancer and no other history of colon cancer in the family to her knowledge  GYNECOLOGIC HISTORY:  No LMP recorded. Patient is postmenopausal. menarche age 13 first live birth age 25 the patient is GX P3. She stopped having periods in 2013. She did not take hormone replacement.   SOCIAL HISTORY: (Updated July 2016 Danielle Oliver works as an auditor. She is divorced. At home is just she and her son Danielle Oliver, 9 years old. Son Danielle Oliver, 27, lives in [place] where he works as a fitness trainer. Son Danielle Oliver, 25, lives in [place] and is working on a masters in counseling at [employer]. The patient has one granddaughter, Danielle Oliver, in [place]. She is not a church attender    ADVANCED DIRECTIVES: Not in place   HEALTH MAINTENANCE: Social History  Substance Use Topics  . Smoking status: Never Smoker  . Smokeless tobacco: Not on file  . Alcohol use No     Comment: rare     Colonoscopy: remote/ Danielle Oliver  PAP: 2014  Bone density:  Lipid panel:  Allergies  Allergen Reactions  . Iodinated Diagnostic Agents Anaphylaxis  . Penicillins Rash    Has patient had a PCN reaction causing immediate rash, facial/tongue/throat swelling, SOB or lightheadedness with hypotension: Yes  Has patient had a PCN reaction causing severe rash involving mucus membranes or skin necrosis: No Has patient had a PCN reaction that required hospitalization No Has patient had a PCN reaction occurring within the last 10 years: Yes If all of the above answers are "NO", then may proceed with Cephalosporin use.    Current Outpatient Prescriptions  Medication Sig Dispense  Refill  . amLODipine-olmesartan (AZOR) 10-40 MG tablet Take 1 tablet by mouth daily. 90 tablet 4  . anastrozole (ARIMIDEX) 1 MG tablet Take 1 tablet (1 mg total) by mouth daily. 90 tablet 3  . Calcium Carb-Cholecalciferol (CALTRATE 600+D) 600-800 MG-UNIT TABS Take by mouth 2 (two) times daily. 60 tablet   . ibuprofen (ADVIL,MOTRIN) 200 MG tablet Take 200-400 mg by mouth daily as needed. Reported on 05/22/2015    . lovastatin (MEVACOR) 40 MG tablet Take 40 mg by mouth daily.    . palbociclib (IBRANCE) 100 MG capsule Take 1 capsule (100 mg total) by mouth daily with breakfast. 21 capsule 0  . predniSONE (DELTASONE) 50 MG tablet Pt to take 1 tab 13 hours, 7 hours, & 1 hour prior to radiology procedure. 3 tablet 0   No current facility-administered medications for this visit.    Facility-Administered Medications Ordered in Other Visits  Medication Dose Route Frequency Provider Last Rate Last Dose  . denosumab (XGEVA) injection 120 mg  120 mg Subcutaneous Once Gustav C [name], MD          OBJECTIVE:Morbidly obese African-American woman Vitals:   05/24/16 0840  BP: (!) 141/93  Pulse: 92  Resp: 17  Temp: 98 F (36.7 C)     Body mass index is 57.23 kg/m.    ECOG FS:1 - Symptomatic but completely ambulatory  Sclerae unicteric, pupils round and equal Oropharynx clear and moist No cervical or supraclavicular adenopathy Lungs no rales or rhonchi Heart regular rate and rhythm Abd soft, obese, nontender, positive bowel sounds MSK no focal spinal tenderness, no upper extremity lymphedema Neuro: nonfocal, well oriented, appropriate affect Breasts: Both axillae are benign   LAB RESULTS:  CMP     Component Value Date/Time   NA 142 05/24/2016 0815   K 3.9 05/24/2016 0815   CL 110 02/16/2015 1152   CO2 26 05/24/2016 0815   GLUCOSE 102 05/24/2016 0815   BUN 11.7 05/24/2016 0815   CREATININE 1.0 05/24/2016 0815   CALCIUM 9.8 05/24/2016 0815   PROT 7.5 05/24/2016 0815   ALBUMIN 3.7  05/24/2016 0815   AST 16 05/24/2016 0815   ALT 19 05/24/2016 0815   ALKPHOS 120 05/24/2016 0815   BILITOT 1.01 05/24/2016 0815   GFRNONAA >60 02/16/2015 1152   GFRAA >60 02/16/2015 1152     INo results found for: SPEP, UPEP  Lab Results  Component Value Date   WBC 4.1 05/24/2016   NEUTROABS 2.3 05/24/2016   HGB 12.4 05/24/2016   HCT 37.0 05/24/2016   MCV 99.7 05/24/2016   PLT 264 05/24/2016      Chemistry      Component Value Date/Time   NA 142 05/24/2016 0815   K 3.9 05/24/2016 0815   CL 110 02/16/2015 1152   CO2 26 05/24/2016 0815   BUN 11.7 05/24/2016 0815   CREATININE 1.0 05/24/2016 0815      Component Value Date/Time   CALCIUM 9.8 05/24/2016 0815   ALKPHOS 120 05/24/2016 0815   AST 16 05/24/2016 0815   ALT 19 05/24/2016 0815   BILITOT 1.01 05/24/2016 0815       Lab Results  Component Value Date   LABCA2 85 (H) 05/22/2015    No components found for: LABCA125  No results for input(s): INR in the last 168 hours.  Urinalysis    Component Value Date/Time   LABSPEC 1.015 04/27/2007 1654   PHURINE 6.0 04/27/2007 1654   GLUCOSEU NEGATIVE 04/27/2007 1654   HGBUR NEGATIVE 04/27/2007 1654   BILIRUBINUR NEGATIVE 04/27/2007 1654   KETONESUR NEGATIVE 04/27/2007 1654   PROTEINUR NEGATIVE 04/27/2007 1654   UROBILINOGEN 0.2 04/27/2007 1654   NITRITE NEGATIVE 04/27/2007 1654   LEUKOCYTESUR  04/27/2007 1654    NEGATIVE Biochemical Testing Only. Please order routine urinalysis from main lab if confirmatory testing is needed.   STUDIES: No results found.  ASSESSMENT: 55 y.o. [place] woman  (1) status post left mastectomy in 1991 followed by adjuvant chemotherapy Consisting of doxorubicin and cyclophosphamide given every 21 days 4 (records under "media")  (2) status post right breast upper outer quadrant biopsy 01/13/2014 for a  cT2 p N1, stage IIB invasive ductal carcinoma, estrogen and progesterone receptor positive, HER-2 negative, with an MIB-1 of  10%.  METASTATIC DISEASE: CT scan 03/18/2014 shows multiple sclerotic bone mets but no visceral disease  (1) no bone biopsy obtained to this point  (b) CA 27-29 is informative  (3) anastrozole started January 2016  (a)  palbociclib added 11/21/2014 at 125 mg daily, 21/7  (b) Palbociclib dose decreased to 100 mg daily, 21/ 7, with cycle 2,   starting 12/21/2014  (4) vitamin D deficiency: On replacement  (5) status post right lumpectomy 02/17/2015 for a pT2 pNX invasive ductal carcinoma, grade 1, estrogen receptor 100% positive, progesterone receptor 20% positive, HER-2 not amplified. Margins were negative  (a) radiation therapy to lumpectomy site declined by patient  (6) denosumab/ Xgeva started 12/15/2014, repeated monthly  (7) genetics testing offered. Patient demurs   PLAN: I spent approximately 30 minutes with Danielle Oliver with most of that time spent discussing her situation. Now that she is getting her treatments more regularly, she is getting better results. This is very favorable.  I refilled her anastrozole and palbociclib today. She is obtaining the palbociclib at $10 per month. She was receiving a different brand of anastrozole and she has gone back to the original brand. She thinks that also may be making a difference  She requested a prescription for bras and I was glad to write that for her. She also requested a letter to see if she can get her daughter transferred to a school closer to her work. I provided that for her today as well.  She also had questions regarding immunotherapy. She understands she is not a candidate because her tumor is HER-2 negative.  At this point the plan is to continue the every 28 day Xgeva doses with every 28 day lab work. She will continue on the palbociclib at the same dose, as she has excellent neutrophil counts. He will continue on anastrozole  I will see her in 2 months. Before that visit she will have a restaging chest CT scan and of course we  will have another 2 CA-27-29 readings  She knows to call for any other issues that may develop before her next visit   [name],GUSTAV C, MD  

## 2016-05-25 LAB — CANCER ANTIGEN 27.29: CA 27.29: 840.1 U/mL — ABNORMAL HIGH (ref 0.0–38.6)

## 2016-05-30 ENCOUNTER — Other Ambulatory Visit: Payer: Self-pay | Admitting: *Deleted

## 2016-06-12 ENCOUNTER — Other Ambulatory Visit: Payer: Self-pay | Admitting: Otolaryngology

## 2016-06-12 ENCOUNTER — Ambulatory Visit
Admission: RE | Admit: 2016-06-12 | Discharge: 2016-06-12 | Disposition: A | Discharge status: Home / Self Care | Payer: BC Managed Care – PPO | Source: Ambulatory Visit | Attending: Otolaryngology | Admitting: Otolaryngology

## 2016-06-12 DIAGNOSIS — J320 Chronic maxillary sinusitis: Secondary | ICD-10-CM

## 2016-06-21 ENCOUNTER — Other Ambulatory Visit (HOSPITAL_BASED_OUTPATIENT_CLINIC_OR_DEPARTMENT_OTHER): Payer: BC Managed Care – PPO

## 2016-06-21 ENCOUNTER — Ambulatory Visit (HOSPITAL_BASED_OUTPATIENT_CLINIC_OR_DEPARTMENT_OTHER): Payer: BC Managed Care – PPO

## 2016-06-21 VITALS — BP 124/74 | HR 86 | Temp 97.0°F | Resp 20

## 2016-06-21 DIAGNOSIS — C50919 Malignant neoplasm of unspecified site of unspecified female breast: Secondary | ICD-10-CM

## 2016-06-21 DIAGNOSIS — C50411 Malignant neoplasm of upper-outer quadrant of right female breast: Secondary | ICD-10-CM

## 2016-06-21 DIAGNOSIS — C7951 Secondary malignant neoplasm of bone: Secondary | ICD-10-CM

## 2016-06-21 DIAGNOSIS — C50911 Malignant neoplasm of unspecified site of right female breast: Secondary | ICD-10-CM

## 2016-06-21 LAB — CBC WITH DIFFERENTIAL/PLATELET
BASO%: 2.4 % — ABNORMAL HIGH (ref 0.0–2.0)
Basophils Absolute: 0.1 10*3/uL (ref 0.0–0.1)
EOS ABS: 0.2 10*3/uL (ref 0.0–0.5)
EOS%: 3.5 % (ref 0.0–7.0)
HCT: 35 % (ref 34.8–46.6)
HEMOGLOBIN: 11.9 g/dL (ref 11.6–15.9)
LYMPH%: 30.5 % (ref 14.0–49.7)
MCH: 34.1 pg — ABNORMAL HIGH (ref 25.1–34.0)
MCHC: 34 g/dL (ref 31.5–36.0)
MCV: 100.2 fL (ref 79.5–101.0)
MONO#: 0.2 10*3/uL (ref 0.1–0.9)
MONO%: 4.3 % (ref 0.0–14.0)
NEUT%: 59.3 % (ref 38.4–76.8)
NEUTROS ABS: 2.7 10*3/uL (ref 1.5–6.5)
PLATELETS: 284 10*3/uL (ref 145–400)
RBC: 3.49 10*6/uL — ABNORMAL LOW (ref 3.70–5.45)
RDW: 14.7 % — AB (ref 11.2–14.5)
WBC: 4.5 10*3/uL (ref 3.9–10.3)
lymph#: 1.4 10*3/uL (ref 0.9–3.3)

## 2016-06-21 LAB — COMPREHENSIVE METABOLIC PANEL
ALT: 19 U/L (ref 0–55)
ANION GAP: 9 meq/L (ref 3–11)
AST: 19 U/L (ref 5–34)
Albumin: 3.4 g/dL — ABNORMAL LOW (ref 3.5–5.0)
Alkaline Phosphatase: 126 U/L (ref 40–150)
BUN: 12.4 mg/dL (ref 7.0–26.0)
CALCIUM: 9 mg/dL (ref 8.4–10.4)
CHLORIDE: 109 meq/L (ref 98–109)
CO2: 24 mEq/L (ref 22–29)
Creatinine: 0.9 mg/dL (ref 0.6–1.1)
EGFR: 84 mL/min/{1.73_m2} — AB (ref >90)
Glucose: 101 mg/dl (ref 70–140)
POTASSIUM: 3.5 meq/L (ref 3.5–5.1)
Sodium: 142 mEq/L (ref 136–145)
Total Bilirubin: 0.82 mg/dL (ref 0.20–1.20)
Total Protein: 7 g/dL (ref 6.4–8.3)

## 2016-06-21 MED ORDER — DENOSUMAB 120 MG/1.7ML ~~LOC~~ SOLN
120.0000 mg | Freq: Once | SUBCUTANEOUS | Status: AC
  Administered 2016-06-21: 120 mg via SUBCUTANEOUS
  Filled 2016-06-21: qty 1.7

## 2016-06-21 NOTE — Patient Instructions (Signed)

## 2016-06-24 ENCOUNTER — Other Ambulatory Visit: Payer: Self-pay | Admitting: Oncology

## 2016-06-24 LAB — CANCER ANTIGEN 27.29: CAN 27.29: 901.8 U/mL — AB (ref 0.0–38.6)

## 2016-06-28 ENCOUNTER — Other Ambulatory Visit: Payer: Self-pay

## 2016-06-28 DIAGNOSIS — C50919 Malignant neoplasm of unspecified site of unspecified female breast: Secondary | ICD-10-CM

## 2016-06-28 DIAGNOSIS — C7951 Secondary malignant neoplasm of bone: Principal | ICD-10-CM

## 2016-06-28 NOTE — Telephone Encounter (Signed)
Refill request signed by Dr Jana Hakim and faxed by this RN to [facility]

## 2016-07-11 ENCOUNTER — Other Ambulatory Visit: Payer: Self-pay

## 2016-07-11 MED ORDER — PREDNISONE 50 MG PO TABS: ORAL_TABLET | ORAL | 0 refills | Status: DC

## 2016-07-11 NOTE — Telephone Encounter (Signed)
Spoke with pt by phone to inform her of prednisone / benadryl protocol prior to CT scan.  Pt instructed to take prednisone 50mg  13 hrs, 7 hrs, and 1hr before the CT scan and Benadryl 50 mgs 1 hr before the scan.  Pt verbalizes understanding

## 2016-07-12 ENCOUNTER — Ambulatory Visit (HOSPITAL_COMMUNITY): Payer: BC Managed Care – PPO

## 2016-07-17 ENCOUNTER — Ambulatory Visit (HOSPITAL_COMMUNITY)
Admission: RE | Admit: 2016-07-17 | Discharge: 2016-07-17 | Disposition: A | Discharge status: Home / Self Care | Payer: BC Managed Care – PPO | Source: Ambulatory Visit | Attending: Oncology | Admitting: Oncology

## 2016-07-17 DIAGNOSIS — C7951 Secondary malignant neoplasm of bone: Secondary | ICD-10-CM | POA: Insufficient documentation

## 2016-07-17 DIAGNOSIS — K449 Diaphragmatic hernia without obstruction or gangrene: Secondary | ICD-10-CM | POA: Insufficient documentation

## 2016-07-17 DIAGNOSIS — K8020 Calculus of gallbladder without cholecystitis without obstruction: Secondary | ICD-10-CM | POA: Diagnosis not present

## 2016-07-17 DIAGNOSIS — C50411 Malignant neoplasm of upper-outer quadrant of right female breast: Secondary | ICD-10-CM

## 2016-07-17 DIAGNOSIS — Z9889 Other specified postprocedural states: Secondary | ICD-10-CM | POA: Diagnosis not present

## 2016-07-17 DIAGNOSIS — Z170 Estrogen receptor positive status [ER+]: Secondary | ICD-10-CM

## 2016-07-17 DIAGNOSIS — C50911 Malignant neoplasm of unspecified site of right female breast: Secondary | ICD-10-CM

## 2016-07-17 DIAGNOSIS — C50919 Malignant neoplasm of unspecified site of unspecified female breast: Secondary | ICD-10-CM

## 2016-07-17 MED ORDER — IOPAMIDOL (ISOVUE-300) INJECTION 61%
INTRAVENOUS | Status: AC
  Administered 2016-07-17: 75 mL
  Filled 2016-07-17: qty 75

## 2016-07-19 ENCOUNTER — Ambulatory Visit (HOSPITAL_BASED_OUTPATIENT_CLINIC_OR_DEPARTMENT_OTHER): Payer: BC Managed Care – PPO | Admitting: Oncology

## 2016-07-19 ENCOUNTER — Ambulatory Visit (HOSPITAL_BASED_OUTPATIENT_CLINIC_OR_DEPARTMENT_OTHER): Payer: BC Managed Care – PPO

## 2016-07-19 ENCOUNTER — Other Ambulatory Visit (HOSPITAL_BASED_OUTPATIENT_CLINIC_OR_DEPARTMENT_OTHER): Payer: BC Managed Care – PPO

## 2016-07-19 VITALS — BP 162/73 | HR 87 | Temp 97.9°F | Resp 20 | Ht 67.0 in | Wt 367.0 lb

## 2016-07-19 DIAGNOSIS — C7951 Secondary malignant neoplasm of bone: Secondary | ICD-10-CM

## 2016-07-19 DIAGNOSIS — Z170 Estrogen receptor positive status [ER+]: Secondary | ICD-10-CM | POA: Diagnosis not present

## 2016-07-19 DIAGNOSIS — E559 Vitamin D deficiency, unspecified: Secondary | ICD-10-CM | POA: Diagnosis not present

## 2016-07-19 DIAGNOSIS — C50911 Malignant neoplasm of unspecified site of right female breast: Secondary | ICD-10-CM

## 2016-07-19 DIAGNOSIS — C50411 Malignant neoplasm of upper-outer quadrant of right female breast: Secondary | ICD-10-CM

## 2016-07-19 DIAGNOSIS — C50919 Malignant neoplasm of unspecified site of unspecified female breast: Secondary | ICD-10-CM

## 2016-07-19 LAB — CBC WITH DIFFERENTIAL/PLATELET
BASO%: 3.5 % — AB (ref 0.0–2.0)
Basophils Absolute: 0.2 10*3/uL — ABNORMAL HIGH (ref 0.0–0.1)
EOS%: 1.9 % (ref 0.0–7.0)
Eosinophils Absolute: 0.1 10*3/uL (ref 0.0–0.5)
HEMATOCRIT: 37.8 % (ref 34.8–46.6)
HEMOGLOBIN: 12.5 g/dL (ref 11.6–15.9)
LYMPH#: 1.5 10*3/uL (ref 0.9–3.3)
LYMPH%: 33.5 % (ref 14.0–49.7)
MCH: 33.9 pg (ref 25.1–34.0)
MCHC: 33.2 g/dL (ref 31.5–36.0)
MCV: 102.3 fL — ABNORMAL HIGH (ref 79.5–101.0)
MONO#: 0.3 10*3/uL (ref 0.1–0.9)
MONO%: 5.9 % (ref 0.0–14.0)
NEUT%: 55.2 % (ref 38.4–76.8)
NEUTROS ABS: 2.5 10*3/uL (ref 1.5–6.5)
PLATELETS: 283 10*3/uL (ref 145–400)
RBC: 3.7 10*6/uL (ref 3.70–5.45)
RDW: 15.4 % — ABNORMAL HIGH (ref 11.2–14.5)
WBC: 4.6 10*3/uL (ref 3.9–10.3)

## 2016-07-19 LAB — COMPREHENSIVE METABOLIC PANEL
ALT: 23 U/L (ref 0–55)
ANION GAP: 12 meq/L — AB (ref 3–11)
AST: 22 U/L (ref 5–34)
Albumin: 3.7 g/dL (ref 3.5–5.0)
Alkaline Phosphatase: 128 U/L (ref 40–150)
BUN: 21.4 mg/dL (ref 7.0–26.0)
CHLORIDE: 109 meq/L (ref 98–109)
CO2: 23 meq/L (ref 22–29)
Calcium: 8.6 mg/dL (ref 8.4–10.4)
Creatinine: 1.1 mg/dL (ref 0.6–1.1)
EGFR: 65 mL/min/{1.73_m2} — AB (ref >90)
Glucose: 98 mg/dl (ref 70–140)
Potassium: 4.3 mEq/L (ref 3.5–5.1)
Sodium: 144 mEq/L (ref 136–145)
Total Bilirubin: 0.68 mg/dL (ref 0.20–1.20)
Total Protein: 7.5 g/dL (ref 6.4–8.3)

## 2016-07-19 MED ORDER — AZITHROMYCIN 250 MG PO TABS: ORAL_TABLET | ORAL | 0 refills | Status: DC

## 2016-07-19 MED ORDER — GABAPENTIN 300 MG PO CAPS: 300.0000 mg | ORAL_CAPSULE | Freq: Every day | ORAL | 4 refills | Status: DC

## 2016-07-19 MED ORDER — DENOSUMAB 120 MG/1.7ML ~~LOC~~ SOLN
120.0000 mg | Freq: Once | SUBCUTANEOUS | Status: AC
  Administered 2016-07-19: 120 mg via SUBCUTANEOUS
  Filled 2016-07-19: qty 1.7

## 2016-07-19 NOTE — Progress Notes (Signed)
Visit  [place]  [phone] [phone]   ID: Danielle Oliver DOB: 10-24-1960  MR#: [id number]  OEU#:[id number]  Patient Care Team: Danielle Rima, MD as PCP - General (Family Medicine) [name], Danielle Dad, MD as Consulting Physician (Oncology) Danielle Overall, MD as Consulting Physician (General Surgery) Danielle Sartorius, MD as Consulting Physician (Otolaryngology) PCP: Danielle Rima, MD GYN: OTHER MD: Danielle Heck MD  CHIEF COMPLAINT: Stage IV breast cancer  CURRENT TREATMENT: Anastrozole, palbociclib, denosumab   BREAST CANCER HISTORY: From Danielle Oliver [facility] 03/11/2014 intake note:  "She was diagnosed with stage III left breast cancer in 1991 when she was 56 years old. Unfortunately her breast cancer diagnosis and treatment history was not available for review today. Apparently she had left mastectomy, and 4 cycles of adjuvant chemotherapy under Danielle Oliver care at our cancer center. Patient does not recall the name of the chemotherapy medication. She was also offered a clinical trial of bone marrow transplant for her breast cancer and she declined. She was followed by January mammograms and had no evidence of recurrence. Her last screening mammogram was in 2009 which was benign.  She noticed a lump in her right breast about 20 weeks ago. It was hard and the tender on palpitation. She denies any skin change or nipple discharge. She has no appetite change, weight loss, or other new symptoms. She had underwent a diagnostic mammogram on 01/12/2014, which showed a 3.6 cm lobulated solid mass in right upper quadrant of right breast, and an oval axillary lymph nodes was cortical thickening. 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 Her right axillary lymph node biopsy also showed invasive adenocarcinoma."  Her subsequent history is as detailed  below  INTERVAL HISTORY: Danielle Oliver returns today for follow-up of her estrogen receptor positive metastatic breast cancer. She continues on anastrozole daily, with good tolerance. She does have some hot flashes and night sweats. She has gabapentin available but has not been taking it. Vaginal dryness is not a major issue. She obtains a drug at a good price.  She also receives denosumab/Xgeva monthly. She has no side effects from that but she is aware of.  Finally she continues on palbociclib. She has tolerated the 100 mg a day dose very well. She is currently on her second week of the three-week cycle and will have a week off of course when she completes that. She does not feel tired or nauseated from this medication  She was just restage with a CT scan of the chest which shows no visceral disease and stable bony metastases.  REVIEW OF SYSTEMS: Danielle Oliver develop significant right periorbital year and jaw pain about a month ago. She was seen in urgent care initially and referred to Danielle. Ernesto Oliver who put her on antibiotics namely a Z-Pak and that certainly improved things. He obtained plain films of her sinuses on 06/19/2016 which was really unremarkable on the right. There was a little bit of  fluid in the maxillary sinus on the left. Since that time the pain has recurred, although a bit duller and less intense than before. She is taking Tylenol for this she is also on Danielle Oliver and Flonase. In addition to all that she has a little bit of numbness sensation in her right lower lip. She denies unusual headaches, visual changes, nausea, vomiting, cough, phlegm production, pleurisy, or change in bladder habits. She remains mildly constipated. A  detailed review of systems today was otherwise stable  PAST MEDICAL HISTORY: Past Medical History:  Diagnosis Date  . Arthritis   . Breast cancer ([place])   . Dysrhythmia   . Gout   . Heart murmur    one Danielle told her, "nothing to worry about"  . Hypertension   . Morbid  obesity with body mass index of 50.0-59.9 in adult [facility])   . Pneumonia 2013 ish  . Shortness of breath dyspnea    With exertion    PAST SURGICAL HISTORY: Past Surgical History:  Procedure Laterality Date  . BREAST LUMPECTOMY WITH RADIOACTIVE SEED LOCALIZATION Right 02/17/2015   Procedure: RIGHT BREAST LUMPECTOMY WITH RADIOACTIVE SEED LOCALIZATION;  Surgeon: Danielle Overall, MD;  Location: [place];  Service: General;  Laterality: Right;  . COLONOSCOPY    . FRACTURE SURGERY Right   . MASTECTOMY Left 1992  . TONSILLECTOMY      FAMILY HISTORY Family History  Problem Relation Age of Onset  . Colon cancer Father    the patient's father died at 16 from colon cancer which was diagnosed shortly before his death. The patient's mother is living, age 51 as of July 2016. The patient is an only child. The only other cancer in the family to her knowledge is a paternal great aunt who developed breast cancer in her late 17s. There is no history of ovarian cancer and no other history of colon cancer in the family to her knowledge  GYNECOLOGIC HISTORY:  No LMP recorded. Patient is postmenopausal. menarche age 45 first live birth age 47 the patient is [name] P3. She stopped having periods in 2013. She did not take hormone replacement.   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 She is divorced. At home is just she and her son Danielle Oliver, 37 years old. Son Danielle Oliver, 27, lives in [name] where he works as a Fish farm manager. Son Danielle Oliver, 25, lives in [place] and is working on a Oceanographer in counseling at [employer]. The patient has one granddaughter, Danielle Oliver, in [name]. She is not a church attender    ADVANCED DIRECTIVES: Not in place   HEALTH MAINTENANCE: Social History  Substance Use Topics  . Smoking status: Never Smoker  . Smokeless tobacco: Not on file  . Alcohol use No     Comment: rare     Colonoscopy: remote/ Danielle Oliver  PAP: 2014  Bone density:  Lipid  panel:  Allergies  Allergen Reactions  . Iodinated Diagnostic Agents Anaphylaxis  . Penicillins Rash    Has patient had a PCN reaction causing immediate rash, facial/tongue/throat swelling, SOB or lightheadedness with hypotension: Yes  Has patient had a PCN reaction causing severe rash involving mucus membranes or skin necrosis: No Has patient had a PCN reaction that required hospitalization No Has patient had a PCN reaction occurring within the last 10 years: Yes If all of the above answers are "NO", then may proceed with Cephalosporin use.    Current Outpatient Prescriptions  Medication Sig Dispense Refill  . Denosumab (XGEVA [place]) Inject into the skin.    . fluticasone (FLONASE) 50 MCG/ACT nasal spray Place into both nostrils daily.    Marland Kitchen levocetirizine (XYZAL) 5 MG tablet Take 5 mg by mouth every evening.    Marland Kitchen amLODipine-olmesartan (AZOR) 10-40 MG tablet Take 1 tablet by mouth daily. 90 tablet 4  . anastrozole (ARIMIDEX) 1 MG tablet Take 1 tablet (1 mg total) by mouth daily. 90 tablet 3  . azithromycin (ZITHROMAX)  250 MG tablet Take as directed 6 each 0  . Calcium Carb-Cholecalciferol (CALTRATE 600+D) 600-800 MG-UNIT TABS Take by mouth 2 (two) times daily. 60 tablet   . gabapentin (NEURONTIN) 300 MG capsule Take 1 capsule (300 mg total) by mouth at bedtime. 90 capsule 4  . ibuprofen (ADVIL,MOTRIN) 200 MG tablet Take 200-400 mg by mouth daily as needed. Reported on 05/22/2015    . lovastatin (MEVACOR) 40 MG tablet Take 40 mg by mouth daily.    . palbociclib (IBRANCE) 100 MG capsule Take 1 capsule (100 mg total) by mouth daily with breakfast. 21 capsule 0  . predniSONE (DELTASONE) 50 MG tablet Pt to take 1 tab 13 hours, 7 hours, & 1 hour prior to radiology procedure. 3 tablet 0   No current facility-administered medications for this visit.     OBJECTIVE:Morbidly obese African-American womanIn no acute distress Vitals:   07/19/16 0912  BP: (!) 162/73  Pulse: 87  Resp: 20  Temp: 97.9  F (36.6 C)     Body mass index is 57.48 kg/m.    ECOG FS:1 - Symptomatic but completely ambulatory  Sclerae unicteric, EOMs intact Oropharynx clear and moist No cervical or supraclavicular adenopathy Lungs no rales or rhonchi Heart regular rate and rhythm Abd soft, nontender, positive bowel sounds MSK no focal spinal tenderness, no upper extremity lymphedema Neuro: nonfocal, well oriented, appropriate affect Breasts: The right breast is status post lumpectomy. There is an area of firmness in the superior aspect of the breast which appears unchanged. The left breast status post mastectomy. There is no evidence of chest wall recurrence. Both axillae are benign.   LAB RESULTS:  CMP     Component Value Date/Time   NA 144 07/19/2016 0850   K 4.3 07/19/2016 0850   CL 110 02/16/2015 1152   CO2 23 07/19/2016 0850   GLUCOSE 98 07/19/2016 0850   BUN 21.4 07/19/2016 0850   CREATININE 1.1 07/19/2016 0850   CALCIUM 8.6 07/19/2016 0850   PROT 7.5 07/19/2016 0850   ALBUMIN 3.7 07/19/2016 0850   AST 22 07/19/2016 0850   ALT 23 07/19/2016 0850   ALKPHOS 128 07/19/2016 0850   BILITOT 0.68 07/19/2016 0850   GFRNONAA >60 02/16/2015 1152   GFRAA >60 02/16/2015 1152     INo results found for: SPEP, UPEP  Lab Results  Component Value Date   WBC 4.6 07/19/2016   NEUTROABS 2.5 07/19/2016   HGB 12.5 07/19/2016   HCT 37.8 07/19/2016   MCV 102.3 (H) 07/19/2016   PLT 283 07/19/2016      Chemistry      Component Value Date/Time   NA 144 07/19/2016 0850   K 4.3 07/19/2016 0850   CL 110 02/16/2015 1152   CO2 23 07/19/2016 0850   BUN 21.4 07/19/2016 0850   CREATININE 1.1 07/19/2016 0850      Component Value Date/Time   CALCIUM 8.6 07/19/2016 0850   ALKPHOS 128 07/19/2016 0850   AST 22 07/19/2016 0850   ALT 23 07/19/2016 0850   BILITOT 0.68 07/19/2016 0850       Lab Results  Component Value Date   LABCA2 85 (H) 05/22/2015    No components found for: [id number]  No results  for input(s): INR in the last 168 hours.  Urinalysis    Component Value Date/Time   LABSPEC 1.015 04/27/2007 1654   PHURINE 6.0 04/27/2007 1654   GLUCOSEU NEGATIVE 04/27/2007 1654   HGBUR NEGATIVE 04/27/2007 Why 04/27/2007 1654  KETONESUR NEGATIVE 04/27/2007 1654   PROTEINUR NEGATIVE 04/27/2007 1654   UROBILINOGEN 0.2 04/27/2007 1654   NITRITE NEGATIVE 04/27/2007 1654   LEUKOCYTESUR  04/27/2007 1654    NEGATIVE Biochemical Testing Only. Please order routine urinalysis from main lab if confirmatory testing is needed.   STUDIES: Ct Chest W Contrast  Result Date: 07/17/2016 CLINICAL DATA:  56 year old female with history of right-sided breast cancer diagnosed in 2015 in 2016 with known bone metastasis. Oral chemotherapy ongoing. Additional history of left-sided breast cancer diagnosed in 1991. Follow-up study. EXAM: CT CHEST WITH CONTRAST TECHNIQUE: Multidetector CT imaging of the chest was performed during intravenous contrast administration. CONTRAST:  39m ISOVUE-300 IOPAMIDOL (ISOVUE-300) INJECTION 61% COMPARISON:  Chest CT 02/29/2016. FINDINGS: Cardiovascular: Heart size is normal. There is no significant pericardial fluid, thickening or pericardial calcification. Calcifications of the mitral annulus. Mediastinum/Nodes: No pathologically enlarged mediastinal, hilar or internal mammary lymph nodes. Small hiatal hernia. No axillary lymphadenopathy. Lungs/Pleura: No suspicious appearing pulmonary nodules or masses. No acute consolidative airspace disease. No pleural effusions. Upper Abdomen: Calcified gallstone incompletely visualized in the neck of the gallbladder measuring at least 1.5 cm. Musculoskeletal: Postoperative changes of lumpectomy are noted in the lateral aspect of the right breast, with a small area of chronic scarring which appears similar to the prior examination. Multifocal sclerotic lesions are again noted throughout the visualized axial and appendicular  skeleton, very similar in appearance to the prior study, compatible with widespread metastatic disease to the bones. IMPRESSION: 1. Widespread metastatic disease to the bones appears very similar to the prior study from 02/29/2016. No extra skeletal metastatic disease is identified on today's examination. 2. Cholelithiasis. 3. Additional incidental findings, as above. Electronically Signed   By: DVinnie LangtonM.D.   On: 07/17/2016 10:06    ASSESSMENT: 56y.o. Impact woman  (1) status post left mastectomy in 1991 followed by adjuvant chemotherapy Consisting of doxorubicin and cyclophosphamide given every 21 days 4 (records under "media")  (2) status post right breast upper outer quadrant biopsy 01/13/2014 for a  cT2 p N1, stage IIB invasive ductal carcinoma, estrogen and progesterone receptor positive, HER-2 negative, with an MIB-1 of 10%.  METASTATIC DISEASE: CT scan 03/18/2014 shows multiple sclerotic bone mets but no visceral disease  (1) no bone biopsy obtained to this point  (b) CA 27-29 is informative  (3) anastrozole started January 2016  (a)  palbociclib added 11/21/2014 at 125 mg daily, 21/7  (b) Palbociclib dose decreased to 100 mg daily, 21/ 7, with cycle 2, starting 12/21/2014  (4) vitamin D deficiency: On replacement  (5) status post right lumpectomy 02/17/2015 for a pT2 pNX invasive ductal carcinoma, grade 1, estrogen receptor 100% positive, progesterone receptor 20% positive, HER-2 not amplified. Margins were negative  (a) radiation therapy to lumpectomy site declined by patient  (6) denosumab/ Xgeva started 12/15/2014, repeated monthly  (7) genetics testing offered. Patient demurs   PLAN: I spent approximately 30 minutes with ALevada Dywith most of that time spent discussing her current status and Oliver prognosis. She is tolerating her treatments well and as far as her recent chest CT scan is concerned disease is stable. It remains favorable that we do not have  visceral disease.  Accordingly we are continuing the anastrozole and denosumab. We are not having to make any changes in her palbociclib dose. We will continue to check her lab work on a monthly basis. Note that her CA 2729 has been rising. There is a repeat today which is pending  I wrote  her a prescription for another Z-Pak since she had a good response to the first one. I suggested she continue her current symptomatic treatment for the right sinus problems. However if this does not clear it I will proceed to a CT of the head, preferably with contrast. It is very difficult for her to have CTs with contrast however because of access issues and because she needs to be premedicated with prednisone which causes her a variety of side effects  Otherwise she will have her next shot in a month and then see me again July 12. We will set her up for repeat scans in August or September depending on her clinical course by then  She has a good understanding of this plan. She agrees with it. She knows to call for any problems that may develop before her next visit.   Chauncey Cruel, MD

## 2016-07-19 NOTE — Patient Instructions (Signed)
Denosumab injection  What is this medicine?  DENOSUMAB (den oh sue mab) slows bone breakdown. Prolia is used to treat osteoporosis in women after menopause and in men. Xgeva is used to prevent bone fractures and other bone problems caused by cancer bone metastases. Xgeva is also used to treat giant cell tumor of the bone.  This medicine may be used for other purposes; ask your health care provider or pharmacist if you have questions.  What should I tell my health care provider before I take this medicine?  They need to know if you have any of these conditions:  -dental disease  -eczema  -infection or history of infections  -kidney disease or on dialysis  -low blood calcium or vitamin D  -malabsorption syndrome  -scheduled to have surgery or tooth extraction  -taking medicine that contains denosumab  -thyroid or parathyroid disease  -an unusual reaction to denosumab, other medicines, foods, dyes, or preservatives  -pregnant or trying to get pregnant  -breast-feeding  How should I use this medicine?  This medicine is for injection under the skin. It is given by a health care professional in a hospital or clinic setting.  If you are getting Prolia, a special MedGuide will be given to you by the pharmacist with each prescription and refill. Be sure to read this information carefully each time.  For Prolia, talk to your pediatrician regarding the use of this medicine in children. Special care may be needed. For Xgeva, talk to your pediatrician regarding the use of this medicine in children. While this drug may be prescribed for children as young as 13 years for selected conditions, precautions do apply.  Overdosage: If you think you have taken too much of this medicine contact a poison control center or emergency room at once.  NOTE: This medicine is only for you. Do not share this medicine with others.  What if I miss a dose?  It is important not to miss your dose. Call your doctor or health care professional if you are  unable to keep an appointment.  What may interact with this medicine?  Do not take this medicine with any of the following medications:  -other medicines containing denosumab  This medicine may also interact with the following medications:  -medicines that suppress the immune system  -medicines that treat cancer  -steroid medicines like prednisone or cortisone  This list may not describe all possible interactions. Give your health care provider a list of all the medicines, herbs, non-prescription drugs, or dietary supplements you use. Also tell them if you smoke, drink alcohol, or use illegal drugs. Some items may interact with your medicine.  What should I watch for while using this medicine?  Visit your doctor or health care professional for regular checks on your progress. Your doctor or health care professional may order blood tests and other tests to see how you are doing.  Call your doctor or health care professional if you get a cold or other infection while receiving this medicine. Do not treat yourself. This medicine may decrease your body's ability to fight infection.  You should make sure you get enough calcium and vitamin D while you are taking this medicine, unless your doctor tells you not to. Discuss the foods you eat and the vitamins you take with your health care professional.  See your dentist regularly. Brush and floss your teeth as directed. Before you have any dental work done, tell your dentist you are receiving this medicine.  Do   not become pregnant while taking this medicine or for 5 months after stopping it. Women should inform their doctor if they wish to become pregnant or think they might be pregnant. There is a potential for serious side effects to an unborn child. Talk to your health care professional or pharmacist for more information.  What side effects may I notice from receiving this medicine?  Side effects that you should report to your doctor or health care professional as soon as  possible:  -allergic reactions like skin rash, itching or hives, swelling of the face, lips, or tongue  -breathing problems  -chest pain  -fast, irregular heartbeat  -feeling faint or lightheaded, falls  -fever, chills, or any other sign of infection  -muscle spasms, tightening, or twitches  -numbness or tingling  -skin blisters or bumps, or is dry, peels, or red  -slow healing or unexplained pain in the mouth or jaw  -unusual bleeding or bruising  Side effects that usually do not require medical attention (Report these to your doctor or health care professional if they continue or are bothersome.):  -muscle pain  -stomach upset, gas  This list may not describe all possible side effects. Call your doctor for medical advice about side effects. You may report side effects to FDA at 1-800-FDA-1088.  Where should I keep my medicine?  This medicine is only given in a clinic, doctor's office, or other health care setting and will not be stored at home.  NOTE: This sheet is a summary. It may not cover all possible information. If you have questions about this medicine, talk to your doctor, pharmacist, or health care provider.      2016, [employer] Standard. (2011-08-19 12:37:47)

## 2016-07-20 LAB — CANCER ANTIGEN 27.29: CA 27.29: 1008.8 U/mL — ABNORMAL HIGH (ref 0.0–38.6)

## 2016-07-24 ENCOUNTER — Other Ambulatory Visit: Payer: Self-pay | Admitting: *Deleted

## 2016-07-24 ENCOUNTER — Telehealth: Payer: Self-pay | Admitting: *Deleted

## 2016-07-24 MED ORDER — ANASTROZOLE 1 MG PO TABS: 1.0000 mg | ORAL_TABLET | Freq: Every day | ORAL | 3 refills | Status: DC

## 2016-07-24 MED ORDER — AZITHROMYCIN 250 MG PO TABS: ORAL_TABLET | ORAL | 0 refills | Status: DC

## 2016-07-24 NOTE — Telephone Encounter (Signed)
"  When I was seen last Friday an order for a Z-pack and anastrozole was to be sent to [facility] has not received these.  (Order sent to local pharmacy by this nurse)   "I also would like to know if a prescription for prednisone daily can be sent as well.  I took prednisone for scans on the 15th whch helped my joints and trigger finger feel better through Sunday (07-21-2016).  My  knee joints have bothered my since I started the Arimidex.  I was diagnosed with trigger finger a year ago, been treated twice and was told the next step is surgery.  If prednisone can be taken daily, I'd like this called in.  Return number [phone]."

## 2016-07-25 NOTE — Telephone Encounter (Signed)
Called pt on 5/23 and informed her MD out of the office until 5/29 - her request will be given to him at that time.  No urgent needs at this time.

## 2016-08-01 ENCOUNTER — Other Ambulatory Visit: Payer: Self-pay

## 2016-08-01 DIAGNOSIS — C50919 Malignant neoplasm of unspecified site of unspecified female breast: Secondary | ICD-10-CM

## 2016-08-01 DIAGNOSIS — C7951 Secondary malignant neoplasm of bone: Principal | ICD-10-CM

## 2016-08-01 MED ORDER — PALBOCICLIB 100 MG PO CAPS: 100.0000 mg | ORAL_CAPSULE | Freq: Every day | ORAL | 0 refills | Status: DC

## 2016-08-14 ENCOUNTER — Other Ambulatory Visit: Payer: Self-pay | Admitting: Oncology

## 2016-08-14 NOTE — Progress Notes (Unsigned)
Visit  [place]  [phone] [phone]   ID: Danielle Oliver DOB: Mar 01, 1961  MR#: [id number]  UGQ#:[id number]  Patient Care Team: Danielle Rima, MD as PCP - General (Family Medicine) Danielle Oliver, Danielle Dad, MD as Consulting Physician (Oncology) Danielle Overall, MD as Consulting Physician (General Surgery) Danielle Sartorius, MD as Consulting Physician (Otolaryngology) PCP: Danielle Rima, MD GYN: OTHER MD: Danielle Heck MD  CHIEF COMPLAINT: Stage IV breast cancer  CURRENT TREATMENT: Anastrozole, palbociclib, denosumab   BREAST CANCER HISTORY: From Dr Danielle Oliver [facility] 03/11/2014 intake note:  "She was diagnosed with stage III left breast cancer in 1991 when she was 56 years old. Unfortunately her breast cancer diagnosis and treatment history was not available for review today. Apparently she had left mastectomy, and 4 cycles of adjuvant chemotherapy under Dr. Mariana Oliver care at our cancer center. Patient does not recall the name of the chemotherapy medication. She was also offered a clinical trial of bone marrow transplant for her breast cancer and she declined. She was followed by January mammograms and had no evidence of recurrence. Her last screening mammogram was in 2009 which was benign.  She noticed a lump in her right breast about 20 weeks ago. It was hard and the tender on palpitation. She denies any skin change or nipple discharge. She has no appetite change, weight loss, or other new symptoms. She had underwent a diagnostic mammogram on 01/12/2014, which showed a 3.6 cm lobulated solid mass in right upper quadrant of right breast, and an oval axillary lymph nodes was cortical thickening. 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 Her right axillary lymph node biopsy also showed invasive adenocarcinoma."  Her subsequent history is as detailed  below  INTERVAL HISTORY: Danielle Oliver returns today for follow-up of her estrogen receptor positive metastatic breast cancer. She continues on anastrozole daily, with good tolerance. She does have some hot flashes and night sweats. She has gabapentin available but has not been taking it. Vaginal dryness is not a major issue. She obtains a drug at a good price.  She also receives denosumab/Xgeva monthly. She has no side effects from that but she is aware of.  Finally she continues on palbociclib. She has tolerated the 100 mg a day dose very well. She is currently on her second week of the three-week cycle and will have a week off of course when she completes that. She does not feel tired or nauseated from this medication  She was just restage with a CT scan of the chest which shows no visceral disease and stable bony metastases.  REVIEW OF SYSTEMS: Danielle Oliver develop significant right periorbital year and jaw pain about a month ago. She was seen in urgent care initially and referred to Dr. Ernesto Oliver who put her on antibiotics namely a Z-Pak and that certainly improved things. He obtained plain films of her sinuses on 06/19/2016 which was really unremarkable on the right. There was a little bit of  fluid in the maxillary sinus on the left. Since that time the pain has recurred, although a bit duller and less intense than before. She is taking Tylenol for this she is also on [place] and Flonase. In addition to all that she has a little bit of numbness sensation in her right lower lip. She denies unusual headaches, visual changes, nausea, vomiting, cough, phlegm production, pleurisy, or change in bladder habits. She remains mildly constipated. A  detailed review of systems today was otherwise stable  PAST MEDICAL HISTORY: Past Medical History:  Diagnosis Date  . Arthritis   . Breast cancer ([place])   . Dysrhythmia   . Gout   . Heart murmur    one Dr told her, "nothing to worry about"  . Hypertension   . Morbid  obesity with body mass index of 50.0-59.9 in adult [facility])   . Pneumonia 2013 ish  . Shortness of breath dyspnea    With exertion    PAST SURGICAL HISTORY: Past Surgical History:  Procedure Laterality Date  . BREAST LUMPECTOMY WITH RADIOACTIVE SEED LOCALIZATION Right 02/17/2015   Procedure: RIGHT BREAST LUMPECTOMY WITH RADIOACTIVE SEED LOCALIZATION;  Surgeon: Danielle Overall, MD;  Location: [facility];  Service: General;  Laterality: Right;  . COLONOSCOPY    . FRACTURE SURGERY Right   . MASTECTOMY Left 1992  . TONSILLECTOMY      FAMILY HISTORY Family History  Problem Relation Age of Onset  . Colon cancer Father    the patient's father died at 82 from colon cancer which was diagnosed shortly before his death. The patient's mother is living, age 71 as of July 2016. The patient is an only child. The only other cancer in the family to her knowledge is a paternal great aunt who developed breast cancer in her late 54s. There is no history of ovarian cancer and no other history of colon cancer in the family to her knowledge  GYNECOLOGIC HISTORY:  No LMP recorded. Patient is postmenopausal. menarche age 49 first live birth age 83 the patient is [place] P3. She stopped having periods in 2013. She did not take hormone replacement.   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 She is divorced. At home is just she and her son Danielle Oliver, 66 years old. Son Danielle Oliver, 27, lives in [name] where he works as a Fish farm manager. Son Danielle Oliver, 25, lives in [place] and is working on a Oceanographer in counseling at [employer]. The patient has one granddaughter, Danielle Oliver, in [name]. She is not a church attender    ADVANCED DIRECTIVES: Not in place   HEALTH MAINTENANCE: Social History  Substance Use Topics  . Smoking status: Never Smoker  . Smokeless tobacco: Not on file  . Alcohol use No     Comment: rare     Colonoscopy: remote/ Danielle Oliver  PAP: 2014  Bone density:  Lipid  panel:  Allergies  Allergen Reactions  . Iodinated Diagnostic Agents Anaphylaxis  . Penicillins Rash    Has patient had a PCN reaction causing immediate rash, facial/tongue/throat swelling, SOB or lightheadedness with hypotension: Yes  Has patient had a PCN reaction causing severe rash involving mucus membranes or skin necrosis: No Has patient had a PCN reaction that required hospitalization No Has patient had a PCN reaction occurring within the last 10 years: Yes If all of the above answers are "NO", then may proceed with Cephalosporin use.    Current Outpatient Prescriptions  Medication Sig Dispense Refill  . amLODipine-olmesartan (AZOR) 10-40 MG tablet Take 1 tablet by mouth daily. 90 tablet 4  . anastrozole (ARIMIDEX) 1 MG tablet Take 1 tablet (1 mg total) by mouth daily. 90 tablet 3  . azithromycin (ZITHROMAX) 250 MG tablet Take as directed 6 each 0  . Calcium Carb-Cholecalciferol (CALTRATE 600+D) 600-800 MG-UNIT TABS Take by mouth 2 (two) times daily. 60 tablet   . Denosumab (XGEVA [place]) Inject into the skin.    . fluticasone (  FLONASE) 50 MCG/ACT nasal spray Place into both nostrils daily.    Marland Kitchen gabapentin (NEURONTIN) 300 MG capsule Take 1 capsule (300 mg total) by mouth at bedtime. 90 capsule 4  . ibuprofen (ADVIL,MOTRIN) 200 MG tablet Take 200-400 mg by mouth daily as needed. Reported on 05/22/2015    . levocetirizine (XYZAL) 5 MG tablet Take 5 mg by mouth every evening.    . lovastatin (MEVACOR) 40 MG tablet Take 40 mg by mouth daily.    . palbociclib (IBRANCE) 100 MG capsule Take 1 capsule (100 mg total) by mouth daily with breakfast. 21 capsule 0  . predniSONE (DELTASONE) 50 MG tablet Pt to take 1 tab 13 hours, 7 hours, & 1 hour prior to radiology procedure. 3 tablet 0   No current facility-administered medications for this visit.     OBJECTIVE:Morbidly obese African-American womanIn no acute distress There were no vitals filed for this visit.   There is no height or weight on  file to calculate BMI.    ECOG FS:1 - Symptomatic but completely ambulatory  Sclerae unicteric, EOMs intact Oropharynx clear and moist No cervical or supraclavicular adenopathy Lungs no rales or rhonchi Heart regular rate and rhythm Abd soft, nontender, positive bowel sounds MSK no focal spinal tenderness, no upper extremity lymphedema Neuro: nonfocal, well oriented, appropriate affect Breasts: The right breast is status post lumpectomy. There is an area of firmness in the superior aspect of the breast which appears unchanged. The left breast status post mastectomy. There is no evidence of chest wall recurrence. Both axillae are benign.   LAB RESULTS:  CMP     Component Value Date/Time   NA 144 07/19/2016 0850   K 4.3 07/19/2016 0850   CL 110 02/16/2015 1152   CO2 23 07/19/2016 0850   GLUCOSE 98 07/19/2016 0850   BUN 21.4 07/19/2016 0850   CREATININE 1.1 07/19/2016 0850   CALCIUM 8.6 07/19/2016 0850   PROT 7.5 07/19/2016 0850   ALBUMIN 3.7 07/19/2016 0850   AST 22 07/19/2016 0850   ALT 23 07/19/2016 0850   ALKPHOS 128 07/19/2016 0850   BILITOT 0.68 07/19/2016 0850   GFRNONAA >60 02/16/2015 1152   GFRAA >60 02/16/2015 1152     INo results found for: SPEP, UPEP  Lab Results  Component Value Date   WBC 4.6 07/19/2016   NEUTROABS 2.5 07/19/2016   HGB 12.5 07/19/2016   HCT 37.8 07/19/2016   MCV 102.3 (H) 07/19/2016   PLT 283 07/19/2016      Chemistry      Component Value Date/Time   NA 144 07/19/2016 0850   K 4.3 07/19/2016 0850   CL 110 02/16/2015 1152   CO2 23 07/19/2016 0850   BUN 21.4 07/19/2016 0850   CREATININE 1.1 07/19/2016 0850      Component Value Date/Time   CALCIUM 8.6 07/19/2016 0850   ALKPHOS 128 07/19/2016 0850   AST 22 07/19/2016 0850   ALT 23 07/19/2016 0850   BILITOT 0.68 07/19/2016 0850       Lab Results  Component Value Date   LABCA2 85 (H) 05/22/2015    No components found for: [id number]  No results for input(s): INR in the  last 168 hours.  Urinalysis    Component Value Date/Time   LABSPEC 1.015 04/27/2007 1654   PHURINE 6.0 04/27/2007 1654   GLUCOSEU NEGATIVE 04/27/2007 1654   HGBUR NEGATIVE 04/27/2007 1654   BILIRUBINUR NEGATIVE 04/27/2007 1654   KETONESUR NEGATIVE 04/27/2007 1654   PROTEINUR NEGATIVE  04/27/2007 1654   UROBILINOGEN 0.2 04/27/2007 1654   NITRITE NEGATIVE 04/27/2007 1654   LEUKOCYTESUR  04/27/2007 1654    NEGATIVE Biochemical Testing Only. Please order routine urinalysis from main lab if confirmatory testing is needed.   STUDIES: Ct Chest W Contrast  Result Date: 07/17/2016 CLINICAL DATA:  56 year old female with history of right-sided breast cancer diagnosed in 2015 in 2016 with known bone metastasis. Oral chemotherapy ongoing. Additional history of left-sided breast cancer diagnosed in 1991. Follow-up study. EXAM: CT CHEST WITH CONTRAST TECHNIQUE: Multidetector CT imaging of the chest was performed during intravenous contrast administration. CONTRAST:  56m ISOVUE-300 IOPAMIDOL (ISOVUE-300) INJECTION 61% COMPARISON:  Chest CT 02/29/2016. FINDINGS: Cardiovascular: Heart size is normal. There is no significant pericardial fluid, thickening or pericardial calcification. Calcifications of the mitral annulus. Mediastinum/Nodes: No pathologically enlarged mediastinal, hilar or internal mammary lymph nodes. Small hiatal hernia. No axillary lymphadenopathy. Lungs/Pleura: No suspicious appearing pulmonary nodules or masses. No acute consolidative airspace disease. No pleural effusions. Upper Abdomen: Calcified gallstone incompletely visualized in the neck of the gallbladder measuring at least 1.5 cm. Musculoskeletal: Postoperative changes of lumpectomy are noted in the lateral aspect of the right breast, with a small area of chronic scarring which appears similar to the prior examination. Multifocal sclerotic lesions are again noted throughout the visualized axial and appendicular skeleton, very similar in  appearance to the prior study, compatible with widespread metastatic disease to the bones. IMPRESSION: 1. Widespread metastatic disease to the bones appears very similar to the prior study from 02/29/2016. No extra skeletal metastatic disease is identified on today's examination. 2. Cholelithiasis. 3. Additional incidental findings, as above. Electronically Signed   By: DVinnie LangtonM.D.   On: 07/17/2016 10:06    ASSESSMENT: 56y.o. [place] woman  (1) status post left mastectomy in 1991 followed by adjuvant chemotherapy Consisting of doxorubicin and cyclophosphamide given every 21 days 4 (records under "media")  (2) status post right breast upper outer quadrant biopsy 01/13/2014 for a  cT2 p N1, stage IIB invasive ductal carcinoma, estrogen and progesterone receptor positive, HER-2 negative, with an MIB-1 of 10%.  METASTATIC DISEASE: CT scan 03/18/2014 shows multiple sclerotic bone mets but no visceral disease  (1) no bone biopsy obtained to this point  (b) CA 27-29 is informative  (3) anastrozole started January 2016  (a)  palbociclib added 11/21/2014 at 125 mg daily, 21/7  (b) Palbociclib dose decreased to 100 mg daily, 21/ 7, with cycle 2, starting 12/21/2014  (4) vitamin D deficiency: On replacement  (5) status post right lumpectomy 02/17/2015 for a pT2 pNX invasive ductal carcinoma, grade 1, estrogen receptor 100% positive, progesterone receptor 20% positive, HER-2 not amplified. Margins were negative  (a) radiation therapy to lumpectomy site declined by patient  (6) denosumab/ Xgeva started 12/15/2014, repeated monthly  (7) genetics testing offered. Patient demurs   PLAN: I spent approximately 30 minutes with ALevada Dywith most of that time spent discussing her current status and Oliver prognosis. She is tolerating her treatments well and as far as her recent chest CT scan is concerned disease is stable. It remains favorable that we do not have visceral  disease.  Accordingly we are continuing the anastrozole and denosumab. We are not having to make any changes in her palbociclib dose. We will continue to check her lab work on a monthly basis. Note that her CA 2729 has been rising. There is a repeat today which is pending  I wrote her a prescription for another Z-Pak since she  had a good response to the first one. I suggested she continue her current symptomatic treatment for the right sinus problems. However if this does not clear it I will proceed to a CT of the head, preferably with contrast. It is very difficult for her to have CTs with contrast however because of access issues and because she needs to be premedicated with prednisone which causes her a variety of side effects  Otherwise she will have her next shot in a month and then see me again July 12. We will set her up for repeat scans in August or September depending on her clinical course by then  She has a good understanding of this plan. She agrees with it. She knows to call for any problems that may develop before her next visit.   Chauncey Cruel, MD

## 2016-08-16 ENCOUNTER — Ambulatory Visit (HOSPITAL_BASED_OUTPATIENT_CLINIC_OR_DEPARTMENT_OTHER): Payer: BC Managed Care – PPO

## 2016-08-16 ENCOUNTER — Other Ambulatory Visit (HOSPITAL_BASED_OUTPATIENT_CLINIC_OR_DEPARTMENT_OTHER): Payer: BC Managed Care – PPO

## 2016-08-16 VITALS — BP 154/89 | HR 88 | Temp 97.0°F | Resp 18

## 2016-08-16 DIAGNOSIS — C50411 Malignant neoplasm of upper-outer quadrant of right female breast: Secondary | ICD-10-CM

## 2016-08-16 DIAGNOSIS — C7951 Secondary malignant neoplasm of bone: Secondary | ICD-10-CM

## 2016-08-16 DIAGNOSIS — C50911 Malignant neoplasm of unspecified site of right female breast: Secondary | ICD-10-CM

## 2016-08-16 LAB — CBC WITH DIFFERENTIAL/PLATELET
BASO%: 2.8 % — AB (ref 0.0–2.0)
Basophils Absolute: 0.1 10*3/uL (ref 0.0–0.1)
EOS%: 2 % (ref 0.0–7.0)
Eosinophils Absolute: 0.1 10*3/uL (ref 0.0–0.5)
HEMATOCRIT: 32.1 % — AB (ref 34.8–46.6)
HEMOGLOBIN: 10.6 g/dL — AB (ref 11.6–15.9)
LYMPH#: 1.4 10*3/uL (ref 0.9–3.3)
LYMPH%: 34.1 % (ref 14.0–49.7)
MCH: 33.4 pg (ref 25.1–34.0)
MCHC: 33 g/dL (ref 31.5–36.0)
MCV: 101.3 fL — ABNORMAL HIGH (ref 79.5–101.0)
MONO#: 0.3 10*3/uL (ref 0.1–0.9)
MONO%: 7.1 % (ref 0.0–14.0)
NEUT%: 54 % (ref 38.4–76.8)
NEUTROS ABS: 2.1 10*3/uL (ref 1.5–6.5)
PLATELETS: 200 10*3/uL (ref 145–400)
RBC: 3.17 10*6/uL — ABNORMAL LOW (ref 3.70–5.45)
RDW: 14.9 % — ABNORMAL HIGH (ref 11.2–14.5)
WBC: 4 10*3/uL (ref 3.9–10.3)
nRBC: 2 % — ABNORMAL HIGH (ref 0–0)

## 2016-08-16 LAB — COMPREHENSIVE METABOLIC PANEL
ALT: 21 U/L (ref 0–55)
AST: 21 U/L (ref 5–34)
Albumin: 3.4 g/dL — ABNORMAL LOW (ref 3.5–5.0)
Alkaline Phosphatase: 143 U/L (ref 40–150)
Anion Gap: 10 mEq/L (ref 3–11)
BUN: 16.7 mg/dL (ref 7.0–26.0)
CALCIUM: 8.8 mg/dL (ref 8.4–10.4)
CHLORIDE: 111 meq/L — AB (ref 98–109)
CO2: 23 meq/L (ref 22–29)
CREATININE: 1.1 mg/dL (ref 0.6–1.1)
EGFR: 68 mL/min/{1.73_m2} — ABNORMAL LOW (ref >90)
GLUCOSE: 109 mg/dL (ref 70–140)
Potassium: 4.2 mEq/L (ref 3.5–5.1)
SODIUM: 144 meq/L (ref 136–145)
Total Bilirubin: 0.64 mg/dL (ref 0.20–1.20)
Total Protein: 7 g/dL (ref 6.4–8.3)

## 2016-08-16 MED ORDER — DENOSUMAB 120 MG/1.7ML ~~LOC~~ SOLN
120.0000 mg | Freq: Once | SUBCUTANEOUS | Status: AC
  Administered 2016-08-16: 120 mg via SUBCUTANEOUS
  Filled 2016-08-16: qty 1.7

## 2016-08-16 NOTE — Patient Instructions (Signed)
Denosumab injection  What is this medicine?  DENOSUMAB (den oh sue mab) slows bone breakdown. Prolia is used to treat osteoporosis in women after menopause and in men. Xgeva is used to prevent bone fractures and other bone problems caused by cancer bone metastases. Xgeva is also used to treat giant cell tumor of the bone.  This medicine may be used for other purposes; ask your health care provider or pharmacist if you have questions.  What should I tell my health care provider before I take this medicine?  They need to know if you have any of these conditions:  -dental disease  -eczema  -infection or history of infections  -kidney disease or on dialysis  -low blood calcium or vitamin D  -malabsorption syndrome  -scheduled to have surgery or tooth extraction  -taking medicine that contains denosumab  -thyroid or parathyroid disease  -an unusual reaction to denosumab, other medicines, foods, dyes, or preservatives  -pregnant or trying to get pregnant  -breast-feeding  How should I use this medicine?  This medicine is for injection under the skin. It is given by a health care professional in a hospital or clinic setting.  If you are getting Prolia, a special MedGuide will be given to you by the pharmacist with each prescription and refill. Be sure to read this information carefully each time.  For Prolia, talk to your pediatrician regarding the use of this medicine in children. Special care may be needed. For Xgeva, talk to your pediatrician regarding the use of this medicine in children. While this drug may be prescribed for children as young as 13 years for selected conditions, precautions do apply.  Overdosage: If you think you have taken too much of this medicine contact a poison control center or emergency room at once.  NOTE: This medicine is only for you. Do not share this medicine with others.  What if I miss a dose?  It is important not to miss your dose. Call your doctor or health care professional if you are  unable to keep an appointment.  What may interact with this medicine?  Do not take this medicine with any of the following medications:  -other medicines containing denosumab  This medicine may also interact with the following medications:  -medicines that suppress the immune system  -medicines that treat cancer  -steroid medicines like prednisone or cortisone  This list may not describe all possible interactions. Give your health care provider a list of all the medicines, herbs, non-prescription drugs, or dietary supplements you use. Also tell them if you smoke, drink alcohol, or use illegal drugs. Some items may interact with your medicine.  What should I watch for while using this medicine?  Visit your doctor or health care professional for regular checks on your progress. Your doctor or health care professional may order blood tests and other tests to see how you are doing.  Call your doctor or health care professional if you get a cold or other infection while receiving this medicine. Do not treat yourself. This medicine may decrease your body's ability to fight infection.  You should make sure you get enough calcium and vitamin D while you are taking this medicine, unless your doctor tells you not to. Discuss the foods you eat and the vitamins you take with your health care professional.  See your dentist regularly. Brush and floss your teeth as directed. Before you have any dental work done, tell your dentist you are receiving this medicine.  Do   not become pregnant while taking this medicine or for 5 months after stopping it. Women should inform their doctor if they wish to become pregnant or think they might be pregnant. There is a potential for serious side effects to an unborn child. Talk to your health care professional or pharmacist for more information.  What side effects may I notice from receiving this medicine?  Side effects that you should report to your doctor or health care professional as soon as  possible:  -allergic reactions like skin rash, itching or hives, swelling of the face, lips, or tongue  -breathing problems  -chest pain  -fast, irregular heartbeat  -feeling faint or lightheaded, falls  -fever, chills, or any other sign of infection  -muscle spasms, tightening, or twitches  -numbness or tingling  -skin blisters or bumps, or is dry, peels, or red  -slow healing or unexplained pain in the mouth or jaw  -unusual bleeding or bruising  Side effects that usually do not require medical attention (Report these to your doctor or health care professional if they continue or are bothersome.):  -muscle pain  -stomach upset, gas  This list may not describe all possible side effects. Call your doctor for medical advice about side effects. You may report side effects to FDA at 1-800-FDA-1088.  Where should I keep my medicine?  This medicine is only given in a clinic, doctor's office, or other health care setting and will not be stored at home.  NOTE: This sheet is a summary. It may not cover all possible information. If you have questions about this medicine, talk to your doctor, pharmacist, or health care provider.      2016, [employer] Standard. (2011-08-19 12:37:47)

## 2016-08-17 LAB — CANCER ANTIGEN 27.29: CA 27.29: 910.7 U/mL — ABNORMAL HIGH (ref 0.0–38.6)

## 2016-09-09 ENCOUNTER — Other Ambulatory Visit: Payer: Self-pay | Admitting: *Deleted

## 2016-09-09 DIAGNOSIS — C50919 Malignant neoplasm of unspecified site of unspecified female breast: Secondary | ICD-10-CM

## 2016-09-09 DIAGNOSIS — C7951 Secondary malignant neoplasm of bone: Principal | ICD-10-CM

## 2016-09-09 MED ORDER — PALBOCICLIB 100 MG PO CAPS: 100.0000 mg | ORAL_CAPSULE | Freq: Every day | ORAL | 0 refills | Status: DC

## 2016-09-12 ENCOUNTER — Encounter (HOSPITAL_BASED_OUTPATIENT_CLINIC_OR_DEPARTMENT_OTHER): Payer: BC Managed Care – PPO

## 2016-09-12 ENCOUNTER — Other Ambulatory Visit (HOSPITAL_BASED_OUTPATIENT_CLINIC_OR_DEPARTMENT_OTHER): Payer: BC Managed Care – PPO

## 2016-09-12 ENCOUNTER — Ambulatory Visit (HOSPITAL_BASED_OUTPATIENT_CLINIC_OR_DEPARTMENT_OTHER): Payer: BC Managed Care – PPO | Admitting: Oncology

## 2016-09-12 ENCOUNTER — Ambulatory Visit: Payer: BC Managed Care – PPO

## 2016-09-12 VITALS — BP 151/84 | HR 100 | Temp 98.1°F | Resp 19 | Ht 67.0 in | Wt 366.1 lb

## 2016-09-12 DIAGNOSIS — H9201 Otalgia, right ear: Secondary | ICD-10-CM | POA: Diagnosis not present

## 2016-09-12 DIAGNOSIS — Z79811 Long term (current) use of aromatase inhibitors: Secondary | ICD-10-CM

## 2016-09-12 DIAGNOSIS — C7951 Secondary malignant neoplasm of bone: Secondary | ICD-10-CM

## 2016-09-12 DIAGNOSIS — R51 Headache: Secondary | ICD-10-CM | POA: Diagnosis not present

## 2016-09-12 DIAGNOSIS — Z170 Estrogen receptor positive status [ER+]: Secondary | ICD-10-CM | POA: Diagnosis not present

## 2016-09-12 DIAGNOSIS — C50411 Malignant neoplasm of upper-outer quadrant of right female breast: Secondary | ICD-10-CM

## 2016-09-12 DIAGNOSIS — C50911 Malignant neoplasm of unspecified site of right female breast: Secondary | ICD-10-CM

## 2016-09-12 DIAGNOSIS — E559 Vitamin D deficiency, unspecified: Secondary | ICD-10-CM

## 2016-09-12 LAB — COMPREHENSIVE METABOLIC PANEL
ALK PHOS: 135 U/L (ref 40–150)
ALT: 44 U/L (ref 0–55)
ANION GAP: 10 meq/L (ref 3–11)
AST: 35 U/L — ABNORMAL HIGH (ref 5–34)
Albumin: 3.4 g/dL — ABNORMAL LOW (ref 3.5–5.0)
BILIRUBIN TOTAL: 0.93 mg/dL (ref 0.20–1.20)
BUN: 14.8 mg/dL (ref 7.0–26.0)
CO2: 22 meq/L (ref 22–29)
Calcium: 8.2 mg/dL — ABNORMAL LOW (ref 8.4–10.4)
Chloride: 111 mEq/L — ABNORMAL HIGH (ref 98–109)
Creatinine: 0.9 mg/dL (ref 0.6–1.1)
EGFR: 81 mL/min/{1.73_m2} — AB (ref >90)
Glucose: 105 mg/dl (ref 70–140)
Potassium: 4.2 mEq/L (ref 3.5–5.1)
Sodium: 144 mEq/L (ref 136–145)
TOTAL PROTEIN: 6.9 g/dL (ref 6.4–8.3)

## 2016-09-12 LAB — CBC WITH DIFFERENTIAL/PLATELET
BASO%: 1.9 % (ref 0.0–2.0)
Basophils Absolute: 0.1 10*3/uL (ref 0.0–0.1)
EOS ABS: 0.1 10*3/uL (ref 0.0–0.5)
EOS%: 1.6 % (ref 0.0–7.0)
HCT: 30.3 % — ABNORMAL LOW (ref 34.8–46.6)
HGB: 10 g/dL — ABNORMAL LOW (ref 11.6–15.9)
LYMPH%: 38.9 % (ref 14.0–49.7)
MCH: 33.6 pg (ref 25.1–34.0)
MCHC: 33 g/dL (ref 31.5–36.0)
MCV: 101.7 fL — AB (ref 79.5–101.0)
MONO#: 0.4 10*3/uL (ref 0.1–0.9)
MONO%: 9.5 % (ref 0.0–14.0)
NEUT%: 48.1 % (ref 38.4–76.8)
NEUTROS ABS: 1.8 10*3/uL (ref 1.5–6.5)
PLATELETS: 152 10*3/uL (ref 145–400)
RBC: 2.98 10*6/uL — AB (ref 3.70–5.45)
RDW: 15.4 % — ABNORMAL HIGH (ref 11.2–14.5)
WBC: 3.7 10*3/uL — AB (ref 3.9–10.3)
lymph#: 1.4 10*3/uL (ref 0.9–3.3)

## 2016-09-12 LAB — TECHNOLOGIST REVIEW

## 2016-09-12 MED ORDER — DENOSUMAB 120 MG/1.7ML ~~LOC~~ SOLN
120.0000 mg | Freq: Once | SUBCUTANEOUS | Status: AC
  Administered 2016-09-12: 120 mg via SUBCUTANEOUS
  Filled 2016-09-12: qty 1.7

## 2016-09-12 NOTE — Patient Instructions (Signed)
Denosumab injection  What is this medicine?  DENOSUMAB (den oh sue mab) slows bone breakdown. Prolia is used to treat osteoporosis in women after menopause and in men. Xgeva is used to prevent bone fractures and other bone problems caused by cancer bone metastases. Xgeva is also used to treat giant cell tumor of the bone.  This medicine may be used for other purposes; ask your health care provider or pharmacist if you have questions.  What should I tell my health care provider before I take this medicine?  They need to know if you have any of these conditions:  -dental disease  -eczema  -infection or history of infections  -kidney disease or on dialysis  -low blood calcium or vitamin D  -malabsorption syndrome  -scheduled to have surgery or tooth extraction  -taking medicine that contains denosumab  -thyroid or parathyroid disease  -an unusual reaction to denosumab, other medicines, foods, dyes, or preservatives  -pregnant or trying to get pregnant  -breast-feeding  How should I use this medicine?  This medicine is for injection under the skin. It is given by a health care professional in a hospital or clinic setting.  If you are getting Prolia, a special MedGuide will be given to you by the pharmacist with each prescription and refill. Be sure to read this information carefully each time.  For Prolia, talk to your pediatrician regarding the use of this medicine in children. Special care may be needed. For Xgeva, talk to your pediatrician regarding the use of this medicine in children. While this drug may be prescribed for children as young as 13 years for selected conditions, precautions do apply.  Overdosage: If you think you have taken too much of this medicine contact a poison control center or emergency room at once.  NOTE: This medicine is only for you. Do not share this medicine with others.  What if I miss a dose?  It is important not to miss your dose. Call your doctor or health care professional if you are  unable to keep an appointment.  What may interact with this medicine?  Do not take this medicine with any of the following medications:  -other medicines containing denosumab  This medicine may also interact with the following medications:  -medicines that suppress the immune system  -medicines that treat cancer  -steroid medicines like prednisone or cortisone  This list may not describe all possible interactions. Give your health care provider a list of all the medicines, herbs, non-prescription drugs, or dietary supplements you use. Also tell them if you smoke, drink alcohol, or use illegal drugs. Some items may interact with your medicine.  What should I watch for while using this medicine?  Visit your doctor or health care professional for regular checks on your progress. Your doctor or health care professional may order blood tests and other tests to see how you are doing.  Call your doctor or health care professional if you get a cold or other infection while receiving this medicine. Do not treat yourself. This medicine may decrease your body's ability to fight infection.  You should make sure you get enough calcium and vitamin D while you are taking this medicine, unless your doctor tells you not to. Discuss the foods you eat and the vitamins you take with your health care professional.  See your dentist regularly. Brush and floss your teeth as directed. Before you have any dental work done, tell your dentist you are receiving this medicine.  Do   not become pregnant while taking this medicine or for 5 months after stopping it. Women should inform their doctor if they wish to become pregnant or think they might be pregnant. There is a potential for serious side effects to an unborn child. Talk to your health care professional or pharmacist for more information.  What side effects may I notice from receiving this medicine?  Side effects that you should report to your doctor or health care professional as soon as  possible:  -allergic reactions like skin rash, itching or hives, swelling of the face, lips, or tongue  -breathing problems  -chest pain  -fast, irregular heartbeat  -feeling faint or lightheaded, falls  -fever, chills, or any other sign of infection  -muscle spasms, tightening, or twitches  -numbness or tingling  -skin blisters or bumps, or is dry, peels, or red  -slow healing or unexplained pain in the mouth or jaw  -unusual bleeding or bruising  Side effects that usually do not require medical attention (Report these to your doctor or health care professional if they continue or are bothersome.):  -muscle pain  -stomach upset, gas  This list may not describe all possible side effects. Call your doctor for medical advice about side effects. You may report side effects to FDA at 1-800-FDA-1088.  Where should I keep my medicine?  This medicine is only given in a clinic, doctor's office, or other health care setting and will not be stored at home.  NOTE: This sheet is a summary. It may not cover all possible information. If you have questions about this medicine, talk to your doctor, pharmacist, or health care provider.      2016, [employer] Standard. (2011-08-19 12:37:47)

## 2016-09-12 NOTE — Progress Notes (Signed)
Visit  [place]  [phone] [phone]   ID: Danielle Oliver DOB: 05-Sep-1960  MR#: [id number]  OFB#:[id number]  Patient Care Team: Danielle Rima, MD as PCP - General (Family Medicine) Oliver, Danielle Dad, MD as Consulting Physician (Oncology) Danielle Overall, MD as Consulting Physician (General Surgery) Danielle Sartorius, MD as Consulting Physician (Otolaryngology) PCP: Danielle Rima, MD GYN: OTHER MD: Danielle Heck MD  CHIEF COMPLAINT: Stage IV breast cancer  CURRENT TREATMENT: Anastrozole, palbociclib, denosumab   BREAST CANCER HISTORY: From Danielle Oliver [facility] 03/11/2014 intake note:  "She was diagnosed with stage III left breast cancer in 1991 when she was 56 years old. Unfortunately her breast cancer diagnosis and treatment history was not available for review today. Apparently she had left mastectomy, and 4 cycles of adjuvant chemotherapy under Danielle. Mariana Oliver care at our cancer center. Patient does not recall the name of the chemotherapy medication. She was also offered a clinical trial of bone marrow transplant for her breast cancer and she declined. She was followed by January mammograms and had no evidence of recurrence. Her last screening mammogram was in 2009 which was benign.  She noticed a lump in her right breast about 20 weeks ago. It was hard and the tender on palpitation. She denies any skin change or nipple discharge. She has no appetite change, weight loss, or other new symptoms. She had underwent a diagnostic mammogram on 01/12/2014, which showed a 3.6 cm lobulated solid mass in right upper quadrant of right breast, and an oval axillary lymph nodes was cortical thickening. 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 Her right axillary lymph node biopsy also showed invasive adenocarcinoma."  Her subsequent history is as detailed  below  INTERVAL HISTORY: Danielle Oliver returns today for follow-up and treatment of her estrogen receptor positive breast cancer. She continues on anastrozole, with good tolerance. Hot flashes and vaginal dryness are not a major issue. She never developed the arthralgias or myalgias that many patients can experience on this medication. She obtains it at a good price.  She is also on palbociclib daily. She tolerates this well. She has mild fatigue, no nausea, from this medication. She has had a little bit of peeling particularly in her thumb bilaterally. This could possibly be related to the medication  REVIEW OF SYSTEMS: Danielle Oliver continues to have pain in her right nostril area, lower right lip, and right ear areas. She went and saw ENT (Danielle Oliver) and he set her up for a scan at [facility]. I do not have the results of that workup or his exam. He told her it was allergies. He Oliver her a prescription for Danielle Oliver which she took without any improvement. She is on Xyzal at present. Aside from this issue, a detailed review of systems today was stable.  PAST MEDICAL HISTORY: Past Medical History:  Diagnosis Date  . Arthritis   . Breast cancer ([place])   . Dysrhythmia   . Gout   . Heart murmur    one Danielle told her, "nothing to worry about"  . Hypertension   . Morbid obesity with body mass index of 50.0-59.9 in adult [facility])   . Pneumonia 2013 ish  . Shortness of breath dyspnea    With exertion    PAST SURGICAL HISTORY: Past Surgical History:  Procedure Laterality Date  . BREAST LUMPECTOMY WITH RADIOACTIVE SEED LOCALIZATION Right 02/17/2015   Procedure: RIGHT  BREAST LUMPECTOMY WITH RADIOACTIVE SEED LOCALIZATION;  Surgeon: Danielle Overall, MD;  Location: [place];  Service: General;  Laterality: Right;  . COLONOSCOPY    . FRACTURE SURGERY Right   . MASTECTOMY Left 1992  . TONSILLECTOMY      FAMILY HISTORY Family History  Problem Relation Age of Onset  . Colon cancer Father    the patient's father  died at 66 from colon cancer which was diagnosed shortly before his death. The patient's mother is living, age 54 as of July 2016. The patient is an only child. The only other cancer in the family to her knowledge is a paternal great aunt who developed breast cancer in her late 28s. There is no history of ovarian cancer and no other history of colon cancer in the family to her knowledge  GYNECOLOGIC HISTORY:  No LMP recorded. Patient is postmenopausal. menarche age 89 first live birth age 25 the patient is Danielle Oliver P3. She stopped having periods in 2013. She did not take hormone replacement.   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 She is divorced. At home is just she and her son Danielle Oliver, 25 years old. Son Danielle Oliver, 27, lives in [name] where he works as a Fish farm manager. Son Danielle Oliver, 25, lives in [place] and is working on a Oceanographer in counseling at [employer]. The patient has one granddaughter, Danielle Oliver, in [name]. She is not a church attender    ADVANCED DIRECTIVES: Not in place   HEALTH MAINTENANCE: Social History  Substance Use Topics  . Smoking status: Never Smoker  . Smokeless tobacco: Not on file  . Alcohol use No     Comment: rare     Colonoscopy: remote/ Danielle Oliver  PAP: 2014  Bone density:  Lipid panel:  Allergies  Allergen Reactions  . Iodinated Diagnostic Agents Anaphylaxis  . Penicillins Rash    Has patient had a PCN reaction causing immediate rash, facial/tongue/throat swelling, SOB or lightheadedness with hypotension: Yes  Has patient had a PCN reaction causing severe rash involving mucus membranes or skin necrosis: No Has patient had a PCN reaction that required hospitalization No Has patient had a PCN reaction occurring within the last 10 years: Yes If all of the above answers are "NO", then may proceed with Cephalosporin use.    Current Outpatient Prescriptions  Medication Sig Dispense Refill  . amLODipine-olmesartan (AZOR)  10-40 MG tablet Take 1 tablet by mouth daily. 90 tablet 4  . anastrozole (ARIMIDEX) 1 MG tablet Take 1 tablet (1 mg total) by mouth daily. 90 tablet 3  . Calcium Carb-Cholecalciferol (CALTRATE 600+D) 600-800 MG-UNIT TABS Take by mouth 2 (two) times daily. 60 tablet   . Denosumab (XGEVA [place]) Inject into the skin.    . fluticasone (FLONASE) 50 MCG/ACT nasal spray Place into both nostrils daily.    Marland Kitchen gabapentin (NEURONTIN) 300 MG capsule Take 1 capsule (300 mg total) by mouth at bedtime. 90 capsule 4  . ibuprofen (ADVIL,MOTRIN) 200 MG tablet Take 200-400 mg by mouth daily as needed. Reported on 05/22/2015    . levocetirizine (XYZAL) 5 MG tablet Take 5 mg by mouth every evening.    . lovastatin (MEVACOR) 40 MG tablet Take 40 mg by mouth daily.    . palbociclib (IBRANCE) 100 MG capsule Take 1 capsule (100 mg total) by mouth daily with breakfast. 21 capsule 0   No current facility-administered medications for this visit.     OBJECTIVE:Morbidly obese African-American woman who appears stated age  Vitals:   09/12/16 1306  BP: (!) 151/84  Pulse: 100  Resp: 19  Temp: 98.1 F (36.7 C)     Body mass index is 57.34 kg/m.    ECOG FS:1 - Symptomatic but completely ambulatory  Sclerae unicteric, pupils round and equal Oropharynx clear and moist No cervical or supraclavicular adenopathy Lungs no rales or rhonchi Heart regular rate and rhythm Abd soft, nontender, positive bowel sounds MSK no focal spinal tenderness, no upper extremity lymphedema Neuro: nonfocal, well oriented, appropriate affect Breasts: Deferred  LAB RESULTS:  CMP     Component Value Date/Time   NA 144 08/16/2016 1400   K 4.2 08/16/2016 1400   CL 110 02/16/2015 1152   CO2 23 08/16/2016 1400   GLUCOSE 109 08/16/2016 1400   BUN 16.7 08/16/2016 1400   CREATININE 1.1 08/16/2016 1400   CALCIUM 8.8 08/16/2016 1400   PROT 7.0 08/16/2016 1400   ALBUMIN 3.4 (L) 08/16/2016 1400   AST 21 08/16/2016 1400   ALT 21 08/16/2016  1400   ALKPHOS 143 08/16/2016 1400   BILITOT 0.64 08/16/2016 1400   GFRNONAA >60 02/16/2015 1152   GFRAA >60 02/16/2015 1152     INo results found for: SPEP, UPEP  Lab Results  Component Value Date   WBC 3.7 (L) 09/12/2016   NEUTROABS 1.8 09/12/2016   HGB 10.0 (L) 09/12/2016   HCT 30.3 (L) 09/12/2016   MCV 101.7 (H) 09/12/2016   PLT 152 09/12/2016      Chemistry      Component Value Date/Time   NA 144 08/16/2016 1400   K 4.2 08/16/2016 1400   CL 110 02/16/2015 1152   CO2 23 08/16/2016 1400   BUN 16.7 08/16/2016 1400   CREATININE 1.1 08/16/2016 1400      Component Value Date/Time   CALCIUM 8.8 08/16/2016 1400   ALKPHOS 143 08/16/2016 1400   AST 21 08/16/2016 1400   ALT 21 08/16/2016 1400   BILITOT 0.64 08/16/2016 1400       Lab Results  Component Value Date   LABCA2 85 (H) 05/22/2015    No components found for: [id number]  No results for input(s): INR in the last 168 hours.  Urinalysis    Component Value Date/Time   LABSPEC 1.015 04/27/2007 1654   PHURINE 6.0 04/27/2007 1654   GLUCOSEU NEGATIVE 04/27/2007 1654   HGBUR NEGATIVE 04/27/2007 1654   BILIRUBINUR NEGATIVE 04/27/2007 1654   KETONESUR NEGATIVE 04/27/2007 1654   PROTEINUR NEGATIVE 04/27/2007 1654   UROBILINOGEN 0.2 04/27/2007 1654   NITRITE NEGATIVE 04/27/2007 1654   LEUKOCYTESUR  04/27/2007 1654    NEGATIVE Biochemical Testing Only. Please order routine urinalysis from main lab if confirmatory testing is needed.   STUDIES: She had likely a scan of her sinusitis at GI. We will try to get those results.   ASSESSMENT: 56 y.o. [place] woman  (1) status post left mastectomy in 1991 followed by adjuvant chemotherapy Consisting of doxorubicin and cyclophosphamide given every 21 days 4 (records under "media")  (2) status post right breast upper outer quadrant biopsy 01/13/2014 for a  cT2 p N1, stage IIB invasive ductal carcinoma, estrogen and progesterone receptor positive, HER-2 negative, with  an MIB-1 of 10%.  METASTATIC DISEASE: CT scan 03/18/2014 shows multiple sclerotic bone mets but no visceral disease  (1) no bone biopsy obtained to this point  (b) CA 27-29 is informative  (3) anastrozole started January 2016  (a)  palbociclib added 11/21/2014 at 125 mg daily, 21/7  (b) Palbociclib  dose decreased to 100 mg daily, 21/ 7, with cycle 2, starting 12/21/2014  (4) vitamin D deficiency: On replacement  (5) status post right lumpectomy 02/17/2015 for a pT2 pNX invasive ductal carcinoma, grade 1, estrogen receptor 100% positive, progesterone receptor 20% positive, HER-2 not amplified. Margins were negative  (a) radiation therapy to lumpectomy site declined by patient  (6) denosumab/ Xgeva started 12/15/2014, repeated monthly  (7) genetics testing offered. Patient demurs   PLAN: I spent approximately 30 minutes with [name] with most of that time spent discussing her complex problems.  She continues to tolerate anastrozole and denosumab remarkably well and as far as her breast cancer is concerned clinically she is stable.  The big problem she is having is the right facial pain involving the right nostril in right ear area and she has had 2 or 3 courses of antibiotics which have not cleared this. She did see ENT, and we're going to try to get Danielle. Berle Mull notes. Among other things he did a scan at [facility]. I don't want to duplicate his workup. She did not find that his Danielle Oliver was useful.  She wonders if steroids would help. I think they certainly would as far as the allergy problem is concerned but then we have issues regarding gastritis, fluid retention, yeast infections, and other possible side effects.  At this point I suggested that she drink a lot of fluids, continue the antihistamines, and call me if things get worse  It would be useful to have a repeat scan at some point soon, but there are real problems with access and allergies and we are postponing scans until  there is a significant up for trend on her tumor markers or there are new symptoms to evaluate.  She will see me again the first week in September. She knows to call for any problems that may develop before that visit.    Chauncey Cruel, MD

## 2016-09-13 ENCOUNTER — Ambulatory Visit: Payer: BC Managed Care – PPO

## 2016-09-13 ENCOUNTER — Other Ambulatory Visit: Payer: BC Managed Care – PPO

## 2016-09-13 LAB — CANCER ANTIGEN 27.29

## 2016-10-01 ENCOUNTER — Other Ambulatory Visit: Payer: Self-pay | Admitting: Emergency Medicine

## 2016-10-01 DIAGNOSIS — C50919 Malignant neoplasm of unspecified site of unspecified female breast: Secondary | ICD-10-CM

## 2016-10-01 DIAGNOSIS — C7951 Secondary malignant neoplasm of bone: Principal | ICD-10-CM

## 2016-10-01 MED ORDER — PALBOCICLIB 100 MG PO CAPS: 100.0000 mg | ORAL_CAPSULE | Freq: Every day | ORAL | 0 refills | Status: DC

## 2016-10-03 ENCOUNTER — Other Ambulatory Visit: Payer: Self-pay

## 2016-10-03 DIAGNOSIS — C50919 Malignant neoplasm of unspecified site of unspecified female breast: Secondary | ICD-10-CM

## 2016-10-03 DIAGNOSIS — C7951 Secondary malignant neoplasm of bone: Principal | ICD-10-CM

## 2016-10-03 MED ORDER — PALBOCICLIB 100 MG PO CAPS: 100.0000 mg | ORAL_CAPSULE | Freq: Every day | ORAL | 0 refills | Status: DC

## 2016-10-10 ENCOUNTER — Other Ambulatory Visit: Payer: BC Managed Care – PPO

## 2016-10-10 ENCOUNTER — Ambulatory Visit: Payer: BC Managed Care – PPO

## 2016-10-11 ENCOUNTER — Ambulatory Visit (HOSPITAL_BASED_OUTPATIENT_CLINIC_OR_DEPARTMENT_OTHER): Payer: BC Managed Care – PPO

## 2016-10-11 ENCOUNTER — Other Ambulatory Visit (HOSPITAL_BASED_OUTPATIENT_CLINIC_OR_DEPARTMENT_OTHER): Payer: BC Managed Care – PPO

## 2016-10-11 VITALS — BP 151/75 | HR 85 | Temp 98.0°F | Resp 20

## 2016-10-11 DIAGNOSIS — C50411 Malignant neoplasm of upper-outer quadrant of right female breast: Secondary | ICD-10-CM

## 2016-10-11 DIAGNOSIS — C7951 Secondary malignant neoplasm of bone: Secondary | ICD-10-CM

## 2016-10-11 DIAGNOSIS — C50911 Malignant neoplasm of unspecified site of right female breast: Secondary | ICD-10-CM

## 2016-10-11 LAB — COMPREHENSIVE METABOLIC PANEL
ALBUMIN: 3.3 g/dL — AB (ref 3.5–5.0)
ALK PHOS: 139 U/L (ref 40–150)
ALT: 28 U/L (ref 0–55)
AST: 29 U/L (ref 5–34)
Anion Gap: 8 mEq/L (ref 3–11)
BUN: 14 mg/dL (ref 7.0–26.0)
CO2: 25 mEq/L (ref 22–29)
Calcium: 8.3 mg/dL — ABNORMAL LOW (ref 8.4–10.4)
Chloride: 109 mEq/L (ref 98–109)
Creatinine: 1 mg/dL (ref 0.6–1.1)
EGFR: 72 mL/min/{1.73_m2} — ABNORMAL LOW (ref >90)
Glucose: 95 mg/dl (ref 70–140)
POTASSIUM: 4 meq/L (ref 3.5–5.1)
SODIUM: 142 meq/L (ref 136–145)
Total Bilirubin: 0.87 mg/dL (ref 0.20–1.20)
Total Protein: 6.7 g/dL (ref 6.4–8.3)

## 2016-10-11 LAB — CBC WITH DIFFERENTIAL/PLATELET
BASO%: 3.6 % — AB (ref 0.0–2.0)
Basophils Absolute: 0.1 10*3/uL (ref 0.0–0.1)
EOS%: 1.6 % (ref 0.0–7.0)
Eosinophils Absolute: 0.1 10*3/uL (ref 0.0–0.5)
HCT: 26.7 % — ABNORMAL LOW (ref 34.8–46.6)
HEMOGLOBIN: 8.7 g/dL — AB (ref 11.6–15.9)
LYMPH%: 42.9 % (ref 14.0–49.7)
MCH: 33.1 pg (ref 25.1–34.0)
MCHC: 32.6 g/dL (ref 31.5–36.0)
MCV: 101.5 fL — ABNORMAL HIGH (ref 79.5–101.0)
MONO#: 0.4 10*3/uL (ref 0.1–0.9)
MONO%: 12.7 % (ref 0.0–14.0)
NEUT#: 1.2 10*3/uL — ABNORMAL LOW (ref 1.5–6.5)
NEUT%: 39.2 % (ref 38.4–76.8)
NRBC: 9 % — AB (ref 0–0)
Platelets: 103 10*3/uL — ABNORMAL LOW (ref 145–400)
RBC: 2.63 10*6/uL — AB (ref 3.70–5.45)
RDW: 15.9 % — AB (ref 11.2–14.5)
WBC: 3.1 10*3/uL — ABNORMAL LOW (ref 3.9–10.3)
lymph#: 1.3 10*3/uL (ref 0.9–3.3)

## 2016-10-11 LAB — TECHNOLOGIST REVIEW

## 2016-10-11 MED ORDER — DENOSUMAB 120 MG/1.7ML ~~LOC~~ SOLN
120.0000 mg | Freq: Once | SUBCUTANEOUS | Status: AC
  Administered 2016-10-11: 120 mg via SUBCUTANEOUS
  Filled 2016-10-11: qty 1.7

## 2016-10-11 NOTE — Patient Instructions (Signed)
Denosumab injection  What is this medicine?  DENOSUMAB (den oh sue mab) slows bone breakdown. Prolia is used to treat osteoporosis in women after menopause and in men. Xgeva is used to prevent bone fractures and other bone problems caused by cancer bone metastases. Xgeva is also used to treat giant cell tumor of the bone.  This medicine may be used for other purposes; ask your health care provider or pharmacist if you have questions.  What should I tell my health care provider before I take this medicine?  They need to know if you have any of these conditions:  -dental disease  -eczema  -infection or history of infections  -kidney disease or on dialysis  -low blood calcium or vitamin D  -malabsorption syndrome  -scheduled to have surgery or tooth extraction  -taking medicine that contains denosumab  -thyroid or parathyroid disease  -an unusual reaction to denosumab, other medicines, foods, dyes, or preservatives  -pregnant or trying to get pregnant  -breast-feeding  How should I use this medicine?  This medicine is for injection under the skin. It is given by a health care professional in a hospital or clinic setting.  If you are getting Prolia, a special MedGuide will be given to you by the pharmacist with each prescription and refill. Be sure to read this information carefully each time.  For Prolia, talk to your pediatrician regarding the use of this medicine in children. Special care may be needed. For Xgeva, talk to your pediatrician regarding the use of this medicine in children. While this drug may be prescribed for children as young as 13 years for selected conditions, precautions do apply.  Overdosage: If you think you have taken too much of this medicine contact a poison control center or emergency room at once.  NOTE: This medicine is only for you. Do not share this medicine with others.  What if I miss a dose?  It is important not to miss your dose. Call your doctor or health care professional if you are  unable to keep an appointment.  What may interact with this medicine?  Do not take this medicine with any of the following medications:  -other medicines containing denosumab  This medicine may also interact with the following medications:  -medicines that suppress the immune system  -medicines that treat cancer  -steroid medicines like prednisone or cortisone  This list may not describe all possible interactions. Give your health care provider a list of all the medicines, herbs, non-prescription drugs, or dietary supplements you use. Also tell them if you smoke, drink alcohol, or use illegal drugs. Some items may interact with your medicine.  What should I watch for while using this medicine?  Visit your doctor or health care professional for regular checks on your progress. Your doctor or health care professional may order blood tests and other tests to see how you are doing.  Call your doctor or health care professional if you get a cold or other infection while receiving this medicine. Do not treat yourself. This medicine may decrease your body's ability to fight infection.  You should make sure you get enough calcium and vitamin D while you are taking this medicine, unless your doctor tells you not to. Discuss the foods you eat and the vitamins you take with your health care professional.  See your dentist regularly. Brush and floss your teeth as directed. Before you have any dental work done, tell your dentist you are receiving this medicine.  Do   not become pregnant while taking this medicine or for 5 months after stopping it. Women should inform their doctor if they wish to become pregnant or think they might be pregnant. There is a potential for serious side effects to an unborn child. Talk to your health care professional or pharmacist for more information.  What side effects may I notice from receiving this medicine?  Side effects that you should report to your doctor or health care professional as soon as  possible:  -allergic reactions like skin rash, itching or hives, swelling of the face, lips, or tongue  -breathing problems  -chest pain  -fast, irregular heartbeat  -feeling faint or lightheaded, falls  -fever, chills, or any other sign of infection  -muscle spasms, tightening, or twitches  -numbness or tingling  -skin blisters or bumps, or is dry, peels, or red  -slow healing or unexplained pain in the mouth or jaw  -unusual bleeding or bruising  Side effects that usually do not require medical attention (Report these to your doctor or health care professional if they continue or are bothersome.):  -muscle pain  -stomach upset, gas  This list may not describe all possible side effects. Call your doctor for medical advice about side effects. You may report side effects to FDA at 1-800-FDA-1088.  Where should I keep my medicine?  This medicine is only given in a clinic, doctor's office, or other health care setting and will not be stored at home.  NOTE: This sheet is a summary. It may not cover all possible information. If you have questions about this medicine, talk to your doctor, pharmacist, or health care provider.      2016, [employer] Standard. (2011-08-19 12:37:47)

## 2016-10-12 LAB — CANCER ANTIGEN 27.29

## 2016-11-05 ENCOUNTER — Other Ambulatory Visit: Payer: Self-pay

## 2016-11-05 DIAGNOSIS — C50919 Malignant neoplasm of unspecified site of unspecified female breast: Secondary | ICD-10-CM

## 2016-11-05 DIAGNOSIS — C7951 Secondary malignant neoplasm of bone: Principal | ICD-10-CM

## 2016-11-05 NOTE — Telephone Encounter (Signed)
ANC 1.2.  Pt is in off week and scheduled to restart Ibrance on 9/06.  Pt has lab appt on 9/06.  Pt informed we will re evaluate when we have lab results on Thurs.

## 2016-11-07 ENCOUNTER — Ambulatory Visit (HOSPITAL_BASED_OUTPATIENT_CLINIC_OR_DEPARTMENT_OTHER): Payer: BC Managed Care – PPO

## 2016-11-07 ENCOUNTER — Telehealth: Payer: Self-pay | Admitting: Oncology

## 2016-11-07 ENCOUNTER — Ambulatory Visit (HOSPITAL_BASED_OUTPATIENT_CLINIC_OR_DEPARTMENT_OTHER): Payer: BC Managed Care – PPO | Admitting: Oncology

## 2016-11-07 ENCOUNTER — Telehealth: Payer: Self-pay | Admitting: Pharmacist

## 2016-11-07 ENCOUNTER — Other Ambulatory Visit (HOSPITAL_BASED_OUTPATIENT_CLINIC_OR_DEPARTMENT_OTHER): Payer: BC Managed Care – PPO

## 2016-11-07 ENCOUNTER — Ambulatory Visit (HOSPITAL_COMMUNITY)
Admission: RE | Admit: 2016-11-07 | Discharge: 2016-11-07 | Disposition: A | Discharge status: Home / Self Care | Payer: BC Managed Care – PPO | Source: Ambulatory Visit | Attending: Oncology | Admitting: Oncology

## 2016-11-07 ENCOUNTER — Telehealth: Payer: Self-pay

## 2016-11-07 VITALS — BP 155/88 | HR 102 | Temp 98.5°F | Resp 20 | Ht 67.0 in | Wt 360.2 lb

## 2016-11-07 DIAGNOSIS — Z170 Estrogen receptor positive status [ER+]: Secondary | ICD-10-CM

## 2016-11-07 DIAGNOSIS — C50411 Malignant neoplasm of upper-outer quadrant of right female breast: Secondary | ICD-10-CM

## 2016-11-07 DIAGNOSIS — C7951 Secondary malignant neoplasm of bone: Secondary | ICD-10-CM

## 2016-11-07 DIAGNOSIS — C50911 Malignant neoplasm of unspecified site of right female breast: Secondary | ICD-10-CM

## 2016-11-07 DIAGNOSIS — D649 Anemia, unspecified: Secondary | ICD-10-CM | POA: Diagnosis not present

## 2016-11-07 DIAGNOSIS — E559 Vitamin D deficiency, unspecified: Secondary | ICD-10-CM | POA: Diagnosis not present

## 2016-11-07 LAB — CBC WITH DIFFERENTIAL/PLATELET
BASO%: 3.1 % — AB (ref 0.0–2.0)
BASOS ABS: 0.1 10*3/uL (ref 0.0–0.1)
EOS%: 1.6 % (ref 0.0–7.0)
Eosinophils Absolute: 0 10*3/uL (ref 0.0–0.5)
HCT: 23.9 % — ABNORMAL LOW (ref 34.8–46.6)
HEMOGLOBIN: 7.6 g/dL — AB (ref 11.6–15.9)
LYMPH%: 45.1 % (ref 14.0–49.7)
MCH: 32.5 pg (ref 25.1–34.0)
MCHC: 31.8 g/dL (ref 31.5–36.0)
MCV: 102.1 fL — AB (ref 79.5–101.0)
MONO#: 0.3 10*3/uL (ref 0.1–0.9)
MONO%: 11.8 % (ref 0.0–14.0)
NEUT#: 1 10*3/uL — ABNORMAL LOW (ref 1.5–6.5)
NEUT%: 38.4 % (ref 38.4–76.8)
Platelets: 95 10*3/uL — ABNORMAL LOW (ref 145–400)
RBC: 2.34 10*6/uL — AB (ref 3.70–5.45)
RDW: 16.5 % — ABNORMAL HIGH (ref 11.2–14.5)
WBC: 2.6 10*3/uL — ABNORMAL LOW (ref 3.9–10.3)
lymph#: 1.2 10*3/uL (ref 0.9–3.3)
nRBC: 13 % — ABNORMAL HIGH (ref 0–0)

## 2016-11-07 LAB — COMPREHENSIVE METABOLIC PANEL
ALBUMIN: 3.4 g/dL — AB (ref 3.5–5.0)
ALK PHOS: 123 U/L (ref 40–150)
ALT: 35 U/L (ref 0–55)
AST: 37 U/L — AB (ref 5–34)
Anion Gap: 10 mEq/L (ref 3–11)
BILIRUBIN TOTAL: 1.18 mg/dL (ref 0.20–1.20)
BUN: 18.4 mg/dL (ref 7.0–26.0)
CO2: 23 mEq/L (ref 22–29)
Calcium: 9.4 mg/dL (ref 8.4–10.4)
Chloride: 108 mEq/L (ref 98–109)
Creatinine: 1.1 mg/dL (ref 0.6–1.1)
EGFR: 68 mL/min/{1.73_m2} — AB (ref >90)
GLUCOSE: 101 mg/dL (ref 70–140)
Potassium: 4.4 mEq/L (ref 3.5–5.1)
SODIUM: 140 meq/L (ref 136–145)
Total Protein: 7 g/dL (ref 6.4–8.3)

## 2016-11-07 MED ORDER — PALBOCICLIB 75 MG PO CAPS: 75.0000 mg | ORAL_CAPSULE | Freq: Every day | ORAL | 6 refills | Status: DC

## 2016-11-07 MED ORDER — DENOSUMAB 120 MG/1.7ML ~~LOC~~ SOLN
120.0000 mg | Freq: Once | SUBCUTANEOUS | Status: AC
  Administered 2016-11-07: 120 mg via SUBCUTANEOUS
  Filled 2016-11-07: qty 1.7

## 2016-11-07 NOTE — Telephone Encounter (Signed)
Medication refill request for Ibrance not refilled at this time d/t pt's [place] is 1 and Dr [name]'s note from today's visit states to stop Ibrance for now.

## 2016-11-07 NOTE — Progress Notes (Signed)
Visit  [place]  [phone] [phone]   ID: KADISHA GOODINE DOB: [birth year]  MR#: [id number]  MKL#:[id number]  Patient Care Team: Helane Rima, MD as PCP - General (Family Medicine) [name], [name], MD as Consulting Physician (Oncology) Alphonsa Overall, MD as Consulting Physician (General Surgery) [name], MD as Consulting Physician (Otolaryngology) PCP: Helane Rima, MD GYN: OTHER MD: [name] MD  CHIEF COMPLAINT: Stage IV breast cancer  CURRENT TREATMENT: Anastrozole, palbociclib, denosumab   BREAST CANCER HISTORY: From Dr Krista Blue [facility] 03/11/2014 intake note:  "She was diagnosed with stage III left breast cancer in 1991 when she was 56 years old. Unfortunately her breast cancer diagnosis and treatment history was not available for review today. Apparently she had left mastectomy, and 4 cycles of adjuvant chemotherapy under Dr. Mariana Kaufman care at our cancer center. Patient does not recall the name of the chemotherapy medication. She was also offered a clinical trial of bone marrow transplant for her breast cancer and she declined. She was followed by January mammograms and had no evidence of recurrence. Her last screening mammogram was in 2009 which was benign.  She noticed a lump in her right breast about 20 weeks ago. It was hard and the tender on palpitation. She denies any skin change or nipple discharge. She has no appetite change, weight loss, or other new symptoms. She had underwent a diagnostic mammogram on 01/12/2014, which showed a 3.6 cm lobulated solid mass in right upper quadrant of right breast, and an oval axillary lymph nodes was cortical thickening. 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 Her right axillary lymph node biopsy also showed invasive adenocarcinoma."  Her subsequent history is as detailed  below  INTERVAL HISTORY: Jayde returns today for follow-up and treatment of her estrogen receptor positive breast cancer. She continues on anastrozole, with good tolerance Hot flashes and vaginal dryness are not a major issue. She never developed the arthralgias or myalgias that many patients can experience on this medication. She obtains it at a good price.  She also receives denosumab today and every 28 days. She has no side effects from that that she is aware of. She is on calcium and vitamin D supplementation.  The problem is her palbociclib. She is currently receiving 100 mg dose. However her counts are very low and we have asked her to hold the treatment. In addition to the low white cell count she also has a significantly low hemoglobin.   REVIEW OF SYSTEMS: Jagger feels very fatigued. She leaves home about 7 in the morning and returns about 7 in the evening, picking up her daughter from her afterschool program. She goes to bed about 10 PM. She is has a treadmill at home but she is not exercising. She feels much more short of breath than prior, but denies cough, fever, phlegm production. The ear pain she was experiencing is gone. She has minimal numbness in her lower lip bilaterally. Sometimes she feels a little dizzy. A detailed review of systems today was otherwise stable  PAST MEDICAL HISTORY: Past Medical History:  Diagnosis Date  . Arthritis   . Breast cancer ([place])   . Dysrhythmia   . Gout   . Heart murmur    one Dr told her, "nothing to worry about"  . Hypertension   . Morbid obesity with body mass index of 50.0-59.9 in adult [facility])   .  Pneumonia 2013 ish  . Shortness of breath dyspnea    With exertion    PAST SURGICAL HISTORY: Past Surgical History:  Procedure Laterality Date  . BREAST LUMPECTOMY WITH RADIOACTIVE SEED LOCALIZATION Right 02/17/2015   Procedure: RIGHT BREAST LUMPECTOMY WITH RADIOACTIVE SEED LOCALIZATION;  Surgeon: Alphonsa Overall, MD;  Location: [facility];   Service: General;  Laterality: Right;  . COLONOSCOPY    . FRACTURE SURGERY Right   . MASTECTOMY Left 1992  . TONSILLECTOMY      FAMILY HISTORY Family History  Problem Relation Age of Onset  . Colon cancer Father    the patient's father died at 57 from colon cancer which was diagnosed shortly before his death. The patient's mother is living, age 25 as of July 2016. The patient is an only child. The only other cancer in the family to her knowledge is a paternal great aunt who developed breast cancer in her late 32s. There is no history of ovarian cancer and no other history of colon cancer in the family to her knowledge  GYNECOLOGIC HISTORY:  No LMP recorded. Patient is postmenopausal. menarche age 8 first live birth age 39 the patient is [place] P3. She stopped having periods in 2013. She did not take hormone replacement.   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 She is divorced. At home is just she and her son [name], 58 years old. Son [name], 27, lives in [name] where he works as a Fish farm manager. Son [name], 25, lives in [place] and is working on a Oceanographer in counseling at [employer]. The patient has one granddaughter, [name], in [name]. She is not a church attender    ADVANCED DIRECTIVES: Not in place   HEALTH MAINTENANCE: Social History  Substance Use Topics  . Smoking status: Never Smoker  . Smokeless tobacco: Not on file  . Alcohol use No     Comment: rare     Colonoscopy: remote/ [name]  PAP: 2014  Bone density:  Lipid panel:  Allergies  Allergen Reactions  . Iodinated Diagnostic Agents Anaphylaxis  . Penicillins Rash    Has patient had a PCN reaction causing immediate rash, facial/tongue/throat swelling, SOB or lightheadedness with hypotension: Yes  Has patient had a PCN reaction causing severe rash involving mucus membranes or skin necrosis: No Has patient had a PCN reaction that required hospitalization No Has patient  had a PCN reaction occurring within the last 10 years: Yes If all of the above answers are "NO", then may proceed with Cephalosporin use.    Current Outpatient Prescriptions  Medication Sig Dispense Refill  . amLODipine-olmesartan (AZOR) 10-40 MG tablet Take 1 tablet by mouth daily. 90 tablet 4  . anastrozole (ARIMIDEX) 1 MG tablet Take 1 tablet (1 mg total) by mouth daily. 90 tablet 3  . Calcium Carb-Cholecalciferol (CALTRATE 600+D) 600-800 MG-UNIT TABS Take by mouth 2 (two) times daily. 60 tablet   . Denosumab (XGEVA [place]) Inject into the skin.    . fluticasone (FLONASE) 50 MCG/ACT nasal spray Place into both nostrils daily.    Marland Kitchen gabapentin (NEURONTIN) 300 MG capsule Take 1 capsule (300 mg total) by mouth at bedtime. 90 capsule 4  . ibuprofen (ADVIL,MOTRIN) 200 MG tablet Take 200-400 mg by mouth daily as needed. Reported on 05/22/2015    . levocetirizine (XYZAL) 5 MG tablet Take 5 mg by mouth every evening.    . lovastatin (MEVACOR) 40 MG tablet Take 40 mg by mouth daily.    Marland Kitchen  palbociclib (IBRANCE) 100 MG capsule Take 1 capsule (100 mg total) by mouth daily with breakfast. 21 capsule 0   No current facility-administered medications for this visit.     OBJECTIVE:Morbidly obese African-American woman In no acute distress  Vitals:   11/07/16 0923  BP: (!) 155/88  Pulse: (!) 102  Resp: 20  Temp: 98.5 F (36.9 C)  SpO2: 98%     Body mass index is 56.42 kg/m.    ECOG FS:1 - Symptomatic but completely ambulatory  Sclerae unicteric, EOMs intact Oropharynx clear and moist No cervical or supraclavicular adenopathy Lungs no rales or rhonchi Heart regular rate and rhythm Abd soft, nontender, positive bowel sounds MSK no focal spinal tenderness, no upper extremity lymphedema Neuro: nonfocal, well oriented, appropriate affect Breasts: Deferred  LAB RESULTS:  CMP     Component Value Date/Time   NA 142 10/11/2016 1345   K 4.0 10/11/2016 1345   CL 110 02/16/2015 1152   CO2 25  10/11/2016 1345   GLUCOSE 95 10/11/2016 1345   BUN 14.0 10/11/2016 1345   CREATININE 1.0 10/11/2016 1345   CALCIUM 8.3 (L) 10/11/2016 1345   PROT 6.7 10/11/2016 1345   ALBUMIN 3.3 (L) 10/11/2016 1345   AST 29 10/11/2016 1345   ALT 28 10/11/2016 1345   ALKPHOS 139 10/11/2016 1345   BILITOT 0.87 10/11/2016 1345   GFRNONAA >60 02/16/2015 1152   GFRAA >60 02/16/2015 1152     INo results found for: SPEP, UPEP  Lab Results  Component Value Date   WBC 2.6 (L) 11/07/2016   NEUTROABS 1.0 (L) 11/07/2016   HGB 7.6 (L) 11/07/2016   HCT 23.9 (L) 11/07/2016   MCV 102.1 (H) 11/07/2016   PLT 95 (L) 11/07/2016      Chemistry      Component Value Date/Time   NA 142 10/11/2016 1345   K 4.0 10/11/2016 1345   CL 110 02/16/2015 1152   CO2 25 10/11/2016 1345   BUN 14.0 10/11/2016 1345   CREATININE 1.0 10/11/2016 1345      Component Value Date/Time   CALCIUM 8.3 (L) 10/11/2016 1345   ALKPHOS 139 10/11/2016 1345   AST 29 10/11/2016 1345   ALT 28 10/11/2016 1345   BILITOT 0.87 10/11/2016 1345       Lab Results  Component Value Date   LABCA2 85 (H) 05/22/2015    No components found for: [id number]  No results for input(s): INR in the last 168 hours.  Urinalysis    Component Value Date/Time   LABSPEC 1.015 04/27/2007 1654   PHURINE 6.0 04/27/2007 1654   GLUCOSEU NEGATIVE 04/27/2007 1654   HGBUR NEGATIVE 04/27/2007 1654   BILIRUBINUR NEGATIVE 04/27/2007 1654   KETONESUR NEGATIVE 04/27/2007 1654   PROTEINUR NEGATIVE 04/27/2007 1654   UROBILINOGEN 0.2 04/27/2007 1654   NITRITE NEGATIVE 04/27/2007 1654   LEUKOCYTESUR  04/27/2007 1654    NEGATIVE Biochemical Testing Only. Please order routine urinalysis from main lab if confirmatory testing is needed.   STUDIES: Most recent staging scan was 07/17/2016, CT of the chest with contrast, showing no visceral disease. She has known widespread metastatic bone involvement  ASSESSMENT: 56 y.o. [place] woman  (1) status post left  mastectomy in 1991 followed by adjuvant chemotherapy Consisting of doxorubicin and cyclophosphamide given every 21 days 4 (records under "media")  (2) status post right breast upper outer quadrant biopsy 01/13/2014 for a  cT2 p N1, stage IIB invasive ductal carcinoma, estrogen and progesterone receptor positive, HER-2 negative, with an MIB-1  of 10%.  METASTATIC DISEASE: CT scan 03/18/2014 shows multiple sclerotic bone mets but no visceral disease  (1) no bone biopsy obtained to this point  (b) CA 27-29 is informative  (3) anastrozole started January 2016  (a)  palbociclib added 11/21/2014 at 125 mg daily, 21/7  (b) Palbociclib dose decreased to 100 mg daily, 21/ 7, with cycle 2, starting 12/21/2014  (4) vitamin D deficiency: On replacement  (5) status post right lumpectomy 02/17/2015 for a pT2 pNX invasive ductal carcinoma, grade 1, estrogen receptor 100% positive, progesterone receptor 20% positive, HER-2 not amplified. Margins were negative  (a) radiation therapy to lumpectomy site declined by patient  (6) denosumab/ Xgeva started 12/15/2014, repeated monthly  (7) genetics testing offered. Patient demurs   PLAN:  Kimaria is significantly anemic, doubtless secondary to her palbociclib. This is a sufficient explanation of her shortness of breath and fatigue, but we are going to obtain a chest x-ray today just to make sure nothing else is going on.  She is on iron supplementation. I'm going to check a ferritin iron studies and a reticulocyte count with her next set of labs which will be 12/06/2016.  We are also going to stop the palbociclib at this point. This will give [place] a little "medication" during which she can explore whether she is having any side effects from this medication beyond the low counts. If we resume meds in October it will be at the lower dose, 75 mg daily.  We discussed the possibility of transfusion and this is available to her but at this point we both agree to  defer  She will then see me 12/14/2016. At that point we will consider restarting palbociclib, or switching to a different regimen because of the continuing low count problem  At some point we will obtain restaging studies, likely without contrast given her poor access and history of allergy to contrast.  She knows to call for any problems that may develop before the next visit here.   Chauncey Cruel, MD

## 2016-11-07 NOTE — Telephone Encounter (Signed)
Gave patient avs and calendar with appts.  °

## 2016-11-07 NOTE — Telephone Encounter (Signed)
Oral Chemotherapy Pharmacist Encounter  Received prescription Ibrance at a dose reduction for the treatment of metastatic, hormone-receptor positive breast cancer in conjunction with anastrazole until disease progression or unacceptable toxicity.  This has been e-scribed to the [facility] for benefits analysis and approval.  Patient had been receiving [name] from [place]. Patient is OK with transfer of Rx to [facility] if her copayment can be reduced to $0 as it was from [facility]. Oral Oncology Patient Advocate will be referred for copayment assistance if needed.  I spoke with patient for overview of: Ibrance. Ibrance start date: 11/21/14   Patient with reduced blood counts and fatigue. Ibrance treatment will be placed on hold until MD visit on 12/12/16 where continuation of [name] will be discussed. Ibrance prescription will be placed on hold at the pharmacy. Ibrance prescription at [facility] [facility] has been discontinued.  Counseled patient on administration, dosing, side effects, safe handling, and monitoring.  Patient will take Ibrance 75mg  capsule, 1 capsule by mouth once daily with breakfast for 21 days on, 7 days off. Patient knows to avoid grapefruit or grapefruit juice.  Side effects include but not limited to: fatigue, decreased blood counts, hair loss, GI upset, and increased upper respiratory infections.    Reviewed with patient importance of keeping a medication schedule and plan for any missed doses.  Danielle Oliver voiced understanding and appreciation.   All questions answered.  Will follow up with patient regarding Ibrance restart date after 12/12/16 MD appointment..   Patient knows to call the office with questions or concerns. Oral Oncology Clinic will continue to follow.  Thank you,  Danielle Oliver, PharmD, BCPS, BCOP 11/07/2016  4:32 PM Oral Oncology Clinic [phone]

## 2016-11-07 NOTE — Patient Instructions (Signed)
Denosumab injection  What is this medicine?  DENOSUMAB (den oh sue mab) slows bone breakdown. Prolia is used to treat osteoporosis in women after menopause and in men. Xgeva is used to prevent bone fractures and other bone problems caused by cancer bone metastases. Xgeva is also used to treat giant cell tumor of the bone.  This medicine may be used for other purposes; ask your health care provider or pharmacist if you have questions.  What should I tell my health care provider before I take this medicine?  They need to know if you have any of these conditions:  -dental disease  -eczema  -infection or history of infections  -kidney disease or on dialysis  -low blood calcium or vitamin D  -malabsorption syndrome  -scheduled to have surgery or tooth extraction  -taking medicine that contains denosumab  -thyroid or parathyroid disease  -an unusual reaction to denosumab, other medicines, foods, dyes, or preservatives  -pregnant or trying to get pregnant  -breast-feeding  How should I use this medicine?  This medicine is for injection under the skin. It is given by a health care professional in a hospital or clinic setting.  If you are getting Prolia, a special MedGuide will be given to you by the pharmacist with each prescription and refill. Be sure to read this information carefully each time.  For Prolia, talk to your pediatrician regarding the use of this medicine in children. Special care may be needed. For Xgeva, talk to your pediatrician regarding the use of this medicine in children. While this drug may be prescribed for children as young as 13 years for selected conditions, precautions do apply.  Overdosage: If you think you have taken too much of this medicine contact a poison control center or emergency room at once.  NOTE: This medicine is only for you. Do not share this medicine with others.  What if I miss a dose?  It is important not to miss your dose. Call your doctor or health care professional if you are  unable to keep an appointment.  What may interact with this medicine?  Do not take this medicine with any of the following medications:  -other medicines containing denosumab  This medicine may also interact with the following medications:  -medicines that suppress the immune system  -medicines that treat cancer  -steroid medicines like prednisone or cortisone  This list may not describe all possible interactions. Give your health care provider a list of all the medicines, herbs, non-prescription drugs, or dietary supplements you use. Also tell them if you smoke, drink alcohol, or use illegal drugs. Some items may interact with your medicine.  What should I watch for while using this medicine?  Visit your doctor or health care professional for regular checks on your progress. Your doctor or health care professional may order blood tests and other tests to see how you are doing.  Call your doctor or health care professional if you get a cold or other infection while receiving this medicine. Do not treat yourself. This medicine may decrease your body's ability to fight infection.  You should make sure you get enough calcium and vitamin D while you are taking this medicine, unless your doctor tells you not to. Discuss the foods you eat and the vitamins you take with your health care professional.  See your dentist regularly. Brush and floss your teeth as directed. Before you have any dental work done, tell your dentist you are receiving this medicine.  Do   not become pregnant while taking this medicine or for 5 months after stopping it. Women should inform their doctor if they wish to become pregnant or think they might be pregnant. There is a potential for serious side effects to an unborn child. Talk to your health care professional or pharmacist for more information.  What side effects may I notice from receiving this medicine?  Side effects that you should report to your doctor or health care professional as soon as  possible:  -allergic reactions like skin rash, itching or hives, swelling of the face, lips, or tongue  -breathing problems  -chest pain  -fast, irregular heartbeat  -feeling faint or lightheaded, falls  -fever, chills, or any other sign of infection  -muscle spasms, tightening, or twitches  -numbness or tingling  -skin blisters or bumps, or is dry, peels, or red  -slow healing or unexplained pain in the mouth or jaw  -unusual bleeding or bruising  Side effects that usually do not require medical attention (Report these to your doctor or health care professional if they continue or are bothersome.):  -muscle pain  -stomach upset, gas  This list may not describe all possible side effects. Call your doctor for medical advice about side effects. You may report side effects to FDA at 1-800-FDA-1088.  Where should I keep my medicine?  This medicine is only given in a clinic, doctor's office, or other health care setting and will not be stored at home.  NOTE: This sheet is a summary. It may not cover all possible information. If you have questions about this medicine, talk to your doctor, pharmacist, or health care provider.      2016, [employer] Standard. (2011-08-19 12:37:47)

## 2016-11-08 LAB — CANCER ANTIGEN 27.29: CA 27.29: 1087.9 U/mL — ABNORMAL HIGH (ref 0.0–38.6)

## 2016-11-29 ENCOUNTER — Other Ambulatory Visit: Payer: Self-pay

## 2016-12-06 ENCOUNTER — Other Ambulatory Visit (HOSPITAL_BASED_OUTPATIENT_CLINIC_OR_DEPARTMENT_OTHER): Payer: BC Managed Care – PPO

## 2016-12-06 ENCOUNTER — Ambulatory Visit (HOSPITAL_BASED_OUTPATIENT_CLINIC_OR_DEPARTMENT_OTHER): Payer: BC Managed Care – PPO

## 2016-12-06 VITALS — BP 157/84 | HR 95 | Temp 98.7°F | Resp 20

## 2016-12-06 DIAGNOSIS — C50911 Malignant neoplasm of unspecified site of right female breast: Secondary | ICD-10-CM

## 2016-12-06 DIAGNOSIS — C50411 Malignant neoplasm of upper-outer quadrant of right female breast: Secondary | ICD-10-CM

## 2016-12-06 DIAGNOSIS — Z170 Estrogen receptor positive status [ER+]: Secondary | ICD-10-CM

## 2016-12-06 DIAGNOSIS — C7951 Secondary malignant neoplasm of bone: Secondary | ICD-10-CM

## 2016-12-06 LAB — IRON AND TIBC
%SAT: 26 % (ref 21–57)
IRON: 80 ug/dL (ref 41–142)
TIBC: 306 ug/dL (ref 236–444)
UIBC: 226 ug/dL (ref 120–384)

## 2016-12-06 LAB — COMPREHENSIVE METABOLIC PANEL
ALBUMIN: 3.2 g/dL — AB (ref 3.5–5.0)
ALK PHOS: 150 U/L (ref 40–150)
ALT: 42 U/L (ref 0–55)
ANION GAP: 12 meq/L — AB (ref 3–11)
AST: 49 U/L — ABNORMAL HIGH (ref 5–34)
BUN: 12.5 mg/dL (ref 7.0–26.0)
CO2: 23 mEq/L (ref 22–29)
Calcium: 8.4 mg/dL (ref 8.4–10.4)
Chloride: 107 mEq/L (ref 98–109)
Creatinine: 0.9 mg/dL (ref 0.6–1.1)
EGFR: 84 mL/min/{1.73_m2} — AB (ref >90)
GLUCOSE: 119 mg/dL (ref 70–140)
POTASSIUM: 3.8 meq/L (ref 3.5–5.1)
SODIUM: 142 meq/L (ref 136–145)
Total Bilirubin: 1.87 mg/dL — ABNORMAL HIGH (ref 0.20–1.20)
Total Protein: 7.2 g/dL (ref 6.4–8.3)

## 2016-12-06 LAB — FERRITIN: FERRITIN: 638 ng/mL — AB (ref 9–269)

## 2016-12-06 LAB — CBC & DIFF AND RETIC
BASO%: 1.1 % (ref 0.0–2.0)
Basophils Absolute: 0.1 10*3/uL (ref 0.0–0.1)
EOS ABS: 0.1 10*3/uL (ref 0.0–0.5)
EOS%: 2.3 % (ref 0.0–7.0)
HEMATOCRIT: 27.9 % — AB (ref 34.8–46.6)
HGB: 8.9 g/dL — ABNORMAL LOW (ref 11.6–15.9)
IMMATURE RETIC FRACT: 25 % — AB (ref 1.60–10.00)
LYMPH#: 1.3 10*3/uL (ref 0.9–3.3)
LYMPH%: 21.6 % (ref 14.0–49.7)
MCH: 32.3 pg (ref 25.1–34.0)
MCHC: 32 g/dL (ref 31.5–36.0)
MCV: 100.9 fL (ref 79.5–101.0)
MONO#: 0.8 10*3/uL (ref 0.1–0.9)
MONO%: 12.8 % (ref 0.0–14.0)
NEUT%: 62.2 % (ref 38.4–76.8)
NEUTROS ABS: 3.8 10*3/uL (ref 1.5–6.5)
PLATELETS: 77 10*3/uL — AB (ref 145–400)
RBC: 2.77 10*6/uL — AB (ref 3.70–5.45)
RDW: 18 % — ABNORMAL HIGH (ref 11.2–14.5)
RETIC %: 12.4 % — AB (ref 0.70–2.10)
RETIC CT ABS: 343.48 10*3/uL — AB (ref 33.70–90.70)
WBC: 6.2 10*3/uL (ref 3.9–10.3)
nRBC: 24 % — ABNORMAL HIGH (ref 0–0)

## 2016-12-06 LAB — TECHNOLOGIST REVIEW: Technologist Review: 21

## 2016-12-06 MED ORDER — DENOSUMAB 120 MG/1.7ML ~~LOC~~ SOLN
120.0000 mg | Freq: Once | SUBCUTANEOUS | Status: AC
  Administered 2016-12-06: 120 mg via SUBCUTANEOUS
  Filled 2016-12-06: qty 1.7

## 2016-12-07 LAB — CANCER ANTIGEN 27.29

## 2016-12-11 NOTE — Progress Notes (Signed)
Visit  [place]  [phone] [phone]   ID: HYDEE FLEECE DOB: [birth year]  MR#: [id number]  QAS#:[id number]  Patient Care Team: Helane Rima, MD as PCP - General (Family Medicine) [name], [name], MD as Consulting Physician (Oncology) Alphonsa Overall, MD as Consulting Physician (General Surgery) [name], MD as Consulting Physician (Otolaryngology) PCP: Helane Rima, MD GYN: OTHER MD: [name] MD  CHIEF COMPLAINT: Stage IV breast cancer  CURRENT TREATMENT: Faslodex,  denosumab/Xgeva   BREAST CANCER HISTORY: From Dr Krista Blue [facility] 03/11/2014 intake note:  "She was diagnosed with stage III left breast cancer in 1991 when she was 56 years old. Unfortunately her breast cancer diagnosis and treatment history was not available for review today. Apparently she had left mastectomy, and 4 cycles of adjuvant chemotherapy under Dr. Mariana Kaufman care at our cancer center. Patient does not recall the name of the chemotherapy medication. She was also offered a clinical trial of bone marrow transplant for her breast cancer and she declined. She was followed by January mammograms and had no evidence of recurrence. Her last screening mammogram was in 2009 which was benign.  She noticed a lump in her right breast about 20 weeks ago. It was hard and the tender on palpitation. She denies any skin change or nipple discharge. She has no appetite change, weight loss, or other new symptoms. She had underwent a diagnostic mammogram on 01/12/2014, which showed a 3.6 cm lobulated solid mass in right upper quadrant of right breast, and an oval axillary lymph nodes was cortical thickening. 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 Her right axillary lymph node biopsy also showed invasive adenocarcinoma."  Her subsequent history is as detailed  below  INTERVAL HISTORY: Ethyl returns today for a follow-up and treatment of her stage IV estrogen receptor positive breast cancer. She continues on anastrozole, generally with good tolerance. She also also on palbociclib but we held that at the last visit because of counts and energy problems.  In fact she is feeling much better off that medication. She is no longer "dragging". It also helps that she started her gabapentin at bedtime, and this is helping her sleep a little bit better.  She received denosumab/Xgeva every 4 weeks. She has no complications from that that she is aware of.  She has developed a painful spot in her left lower jaw area, with a little bit of numbness in the left lower lip area. She tells me she has made an appointment with her dentist for next week to evaluate this further.  REVIEW OF SYSTEMS: Maniyah reports she is doing better, however, not quite 100 percent. She notes being able to sleep better after taking gabapentin as prescribed. Pt has noticed increased bruising with minimal trauma. She recently went back to her ENT doctor for her allergies and was given a zpak with relief. She is going to dental works next week for dental pain to the bottom left side of her mouth. She denies unusual headaches, visual changes, nausea, vomiting, or dizziness. There has been no unusual cough, phlegm production, or pleurisy. This been no change in bowel or bladder habits. She denies unexplained fatigue or unexplained weight loss, bleeding, rash, or fever. A detailed review of systems was otherwise entirely stable  PAST MEDICAL HISTORY: Past Medical History:  Diagnosis Date  . Arthritis   . Breast cancer ([place])   . Dysrhythmia   .  Gout   . Heart murmur    one Dr told her, "nothing to worry about"  . Hypertension   . Morbid obesity with body mass index of 50.0-59.9 in adult [facility])   . Pneumonia 2013 ish  . Shortness of breath dyspnea    With exertion    PAST SURGICAL  HISTORY: Past Surgical History:  Procedure Laterality Date  . BREAST LUMPECTOMY WITH RADIOACTIVE SEED LOCALIZATION Right 02/17/2015   Procedure: RIGHT BREAST LUMPECTOMY WITH RADIOACTIVE SEED LOCALIZATION;  Surgeon: Alphonsa Overall, MD;  Location: [facility];  Service: General;  Laterality: Right;  . COLONOSCOPY    . FRACTURE SURGERY Right   . MASTECTOMY Left 1992  . TONSILLECTOMY      FAMILY HISTORY Family History  Problem Relation Age of Onset  . Colon cancer Father    [name] died at 3 from colon cancer which was diagnosed shortly before his death. The patient's mother is living, age 32 as of July 2016. The patient is an only child. The only other cancer in the family to her knowledge is a paternal great aunt who developed breast cancer in her late 55s. There is no history of ovarian cancer and no other history of colon cancer in the family to her knowledge  GYNECOLOGIC HISTORY:  No LMP recorded. Patient is postmenopausal. menarche age 71 first live birth age 48 the patient is [place] P3. She stopped having periods in 2013. She did not take hormone replacement.   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 She is divorced. At home is just she and her son [name], 46 years old. Son, [name], 27, lives in [name] where he works as a Fish farm manager. Son, [name], 25, lives in [place] and is working on a Oceanographer in counseling at [employer]. The patient has one granddaughter, [name], in [name]. She is not a church attender    ADVANCED DIRECTIVES: Not in place   HEALTH MAINTENANCE: Social History  Substance Use Topics  . Smoking status: Never Smoker  . Smokeless tobacco: Not on file  . Alcohol use No     Comment: rare     Colonoscopy: remote/ [name]  PAP: 2014  Bone density:  Lipid panel:  Allergies  Allergen Reactions  . Iodinated Diagnostic Agents Anaphylaxis  . Penicillins Rash    Has patient had a PCN reaction causing immediate  rash, facial/tongue/throat swelling, SOB or lightheadedness with hypotension: Yes  Has patient had a PCN reaction causing severe rash involving mucus membranes or skin necrosis: No Has patient had a PCN reaction that required hospitalization No Has patient had a PCN reaction occurring within the last 10 years: Yes If all of the above answers are "NO", then may proceed with Cephalosporin use.    Current Outpatient Prescriptions  Medication Sig Dispense Refill  . amLODipine-olmesartan (AZOR) 10-40 MG tablet Take 1 tablet by mouth daily. 90 tablet 4  . anastrozole (ARIMIDEX) 1 MG tablet Take 1 tablet (1 mg total) by mouth daily. 90 tablet 3  . Calcium Carb-Cholecalciferol (CALTRATE 600+D) 600-800 MG-UNIT TABS Take by mouth 2 (two) times daily. 60 tablet   . Denosumab (XGEVA [place]) Inject into the skin.    . fluticasone (FLONASE) 50 MCG/ACT nasal spray Place into both nostrils daily.    Marland Kitchen ibuprofen (ADVIL,MOTRIN) 200 MG tablet Take 200-400 mg by mouth daily as needed. Reported on 05/22/2015    . levocetirizine (XYZAL) 5 MG tablet Take 5 mg by mouth every evening.    Marland Kitchen  palbociclib (IBRANCE) 75 MG capsule Take 1 capsule (75 mg total) by mouth daily with breakfast. Take whole with food. 21 capsule 6   No current facility-administered medications for this visit.     OBJECTIVE:Morbidly obese African-American womanWho appears stated age  3:   12/12/16 1447  BP: (!) 165/108  Pulse: 93  Resp: 18  Temp: (!) 97.5 F (36.4 C)  SpO2: 99%     Body mass index is 55.16 kg/m.    ECOG FS:1 - Symptomatic but completely ambulatory  Sclerae unicteric, pupils round and equal Oropharynx clear and moist No cervical or supraclavicular adenopathy Lungs no rales or rhonchi Heart regular rate and rhythm Abd soft, nontender, positive bowel sounds MSK no focal spinal tenderness, no upper extremity lymphedema Neuro: nonfocal, well oriented, appropriate affect Breasts: Deferred  LAB RESULTS:  CMP      Component Value Date/Time   NA 142 12/06/2016 1331   K 3.8 12/06/2016 1331   CL 110 02/16/2015 1152   CO2 23 12/06/2016 1331   GLUCOSE 119 12/06/2016 1331   BUN 12.5 12/06/2016 1331   CREATININE 0.9 12/06/2016 1331   CALCIUM 8.4 12/06/2016 1331   PROT 7.2 12/06/2016 1331   ALBUMIN 3.2 (L) 12/06/2016 1331   AST 49 (H) 12/06/2016 1331   ALT 42 12/06/2016 1331   ALKPHOS 150 12/06/2016 1331   BILITOT 1.87 (H) 12/06/2016 1331   GFRNONAA >60 02/16/2015 1152   GFRAA >60 02/16/2015 1152     INo results found for: SPEP, UPEP  Lab Results  Component Value Date   WBC 6.2 12/06/2016   NEUTROABS 3.8 12/06/2016   HGB 8.9 (L) 12/06/2016   HCT 27.9 (L) 12/06/2016   MCV 100.9 12/06/2016   PLT 77 (L) 12/06/2016      Chemistry      Component Value Date/Time   NA 142 12/06/2016 1331   K 3.8 12/06/2016 1331   CL 110 02/16/2015 1152   CO2 23 12/06/2016 1331   BUN 12.5 12/06/2016 1331   CREATININE 0.9 12/06/2016 1331      Component Value Date/Time   CALCIUM 8.4 12/06/2016 1331   ALKPHOS 150 12/06/2016 1331   AST 49 (H) 12/06/2016 1331   ALT 42 12/06/2016 1331   BILITOT 1.87 (H) 12/06/2016 1331       Lab Results  Component Value Date   LABCA2 85 (H) 05/22/2015    No components found for: [id number]  No results for input(s): INR in the last 168 hours.  Urinalysis    Component Value Date/Time   LABSPEC 1.015 04/27/2007 1654   PHURINE 6.0 04/27/2007 1654   GLUCOSEU NEGATIVE 04/27/2007 1654   HGBUR NEGATIVE 04/27/2007 1654   BILIRUBINUR NEGATIVE 04/27/2007 1654   KETONESUR NEGATIVE 04/27/2007 1654   PROTEINUR NEGATIVE 04/27/2007 1654   UROBILINOGEN 0.2 04/27/2007 1654   NITRITE NEGATIVE 04/27/2007 1654   LEUKOCYTESUR  04/27/2007 1654    NEGATIVE Biochemical Testing Only. Please order routine urinalysis from main lab if confirmatory testing is needed.   STUDIES: 1. 11/07/2016, CT of the chest, showing extensive bony metastatic disease. Lungs clear. No evident thoracic  adenopathy.  2. 07/17/2016, CT of the chest with contrast, showing no visceral disease. She has known widespread metastatic bone involvement  ASSESSMENT: 56 y.o. [place] woman  (1) status post left mastectomy in 1991 followed by adjuvant chemotherapy Consisting of doxorubicin and cyclophosphamide given every 21 days 4 (records under "media")  (2) status post right breast upper outer quadrant biopsy 01/13/2014 for a  cT2 p N1, stage IIB invasive ductal carcinoma, estrogen and progesterone receptor positive, HER-2 negative, with an MIB-1 of 10%.  METASTATIC DISEASE: CT scan 03/18/2014 shows multiple sclerotic bone mets but no visceral disease  (1) no bone biopsy obtained to this point  (b) CA 27-29 is informative  (3) anastrozole started January 2016, discontinued September 2018  (a)  palbociclib added 11/21/2014 at 125 mg daily, 21/7  (b) Palbociclib dose decreased to 100 mg daily, 21/ 7, with cycle 2, starting 12/21/2014  (c) palbociclib held after 11/07/2016 because of continuing low counts  (4) vitamin D deficiency: On replacement  (5) status post right lumpectomy 02/17/2015 for a pT2 pNX invasive ductal carcinoma, grade 1, estrogen receptor 100% positive, progesterone receptor 20% positive, HER-2 not amplified. Margins were negative  (a) radiation therapy to lumpectomy site declined by patient  (6) denosumab/ Xgeva started 12/15/2014, repeated monthly  (7) genetics testing discussed  (8) to start fulvestrant 12/20/2016   PLAN:  Aanya Had a good run on the anastrozole/palbociclib combination, and would be interested in continuing it, perhaps at a lower palbociclib dose. That is not unreasonable, of course.  However I am concerned about the discomfort she is experiencing in her left jaw. I suspect this is a new bone metastases. She is already scheduled for dental appointment and of course should have a Panorex to evaluate this further.  I gave her a copy of her diagnosis and  treatment so her dentist will be aware that she is on denosumab/Xgeva and minimize any interventions involving the jaw bone in that area.  My suggestion to her is that we switch over to Faslodex. She understands this is not chemotherapy but a form of anti-estrogen treatment. We discussed the possible toxicities side effects and complications of this agent.  She is going to be started on 12/20/2016. I will see her again when she returns for her next dose, 01/03/2017. At that time she will be also due for her next denosumab dose  She knows to call for any problems that may develop before that visit.     [name], [name], MD  12/12/16 3:01 PM Medical Oncology and Hematology [facility] [address] [place], [place] [address] Tel. [phone]    Fax. [phone]  This document serves as a record of services personally performed by Chauncey Cruel, MD. It was created on her behalf by Margit Banda, a trained medical scribe. The creation of this record is based on the scribe's personal observations and the provider's statements to them. This document has been checked and approved by the attending provider.

## 2016-12-12 ENCOUNTER — Ambulatory Visit (HOSPITAL_BASED_OUTPATIENT_CLINIC_OR_DEPARTMENT_OTHER): Payer: BC Managed Care – PPO | Admitting: Oncology

## 2016-12-12 VITALS — BP 165/108 | HR 93 | Temp 97.5°F | Resp 18 | Ht 67.0 in | Wt 352.2 lb

## 2016-12-12 DIAGNOSIS — E559 Vitamin D deficiency, unspecified: Secondary | ICD-10-CM

## 2016-12-12 DIAGNOSIS — R6884 Jaw pain: Secondary | ICD-10-CM | POA: Diagnosis not present

## 2016-12-12 DIAGNOSIS — C50911 Malignant neoplasm of unspecified site of right female breast: Secondary | ICD-10-CM

## 2016-12-12 DIAGNOSIS — C7951 Secondary malignant neoplasm of bone: Secondary | ICD-10-CM

## 2016-12-12 DIAGNOSIS — C50411 Malignant neoplasm of upper-outer quadrant of right female breast: Secondary | ICD-10-CM | POA: Diagnosis not present

## 2016-12-12 DIAGNOSIS — R209 Unspecified disturbances of skin sensation: Secondary | ICD-10-CM | POA: Diagnosis not present

## 2016-12-12 DIAGNOSIS — Z170 Estrogen receptor positive status [ER+]: Secondary | ICD-10-CM

## 2016-12-12 MED ORDER — GABAPENTIN 300 MG PO CAPS
300.0000 mg | ORAL_CAPSULE | Freq: Every day | ORAL | 4 refills | Status: DC
Start: 2016-12-12 — End: 2016-12-19

## 2016-12-14 NOTE — Telephone Encounter (Signed)
Oral Oncology Pharmacist Encounter  Received notification from MD that palbociclib will be discontinued at this time.  No further needs from [facility] identified at this time. Oral Oncology Clinic will sign off. Please let us know if we can be of assistance in the future.  Johny Drilling, PharmD, BCPS, BCOP 12/14/2016 11:11 AM Oral Oncology Clinic [phone]

## 2016-12-17 ENCOUNTER — Telehealth: Payer: Self-pay | Admitting: Oncology

## 2016-12-17 NOTE — Telephone Encounter (Signed)
Called patient regarding schedule °

## 2016-12-19 ENCOUNTER — Telehealth: Payer: Self-pay

## 2016-12-19 ENCOUNTER — Other Ambulatory Visit: Payer: Self-pay

## 2016-12-19 MED ORDER — GABAPENTIN 300 MG PO CAPS: 300.0000 mg | ORAL_CAPSULE | Freq: Every day | ORAL | 4 refills | Status: DC

## 2016-12-19 MED ORDER — PREDNISONE 50 MG PO TABS: ORAL_TABLET | ORAL | 0 refills | Status: DC

## 2016-12-19 NOTE — Telephone Encounter (Signed)
Spoke with pt by phone to instruct on dye allergy protocol to be followed prior to CT scan.  Pt instructed to take Prednisone 50mg  at 13 hours, 7 hours, and 1 hour prior to CT scan.  And Benadryl 50mg  1 hour prior to scan.  Pt verbalizes understanding.  Pt states also that her pharmacy did not get script for gabapentin.  Pt states her preferred pharmacy is [facility] [address] [facility].  This information updated in our system and scripts for gabapentin and prednisone sent to this pharmacy.

## 2016-12-20 ENCOUNTER — Ambulatory Visit (HOSPITAL_BASED_OUTPATIENT_CLINIC_OR_DEPARTMENT_OTHER): Payer: BC Managed Care – PPO

## 2016-12-20 ENCOUNTER — Telehealth: Payer: Self-pay | Admitting: *Deleted

## 2016-12-20 ENCOUNTER — Ambulatory Visit (HOSPITAL_COMMUNITY): Payer: BC Managed Care – PPO

## 2016-12-20 ENCOUNTER — Other Ambulatory Visit (HOSPITAL_BASED_OUTPATIENT_CLINIC_OR_DEPARTMENT_OTHER): Payer: BC Managed Care – PPO

## 2016-12-20 VITALS — BP 158/84 | HR 92 | Temp 97.5°F | Resp 18

## 2016-12-20 DIAGNOSIS — C50411 Malignant neoplasm of upper-outer quadrant of right female breast: Secondary | ICD-10-CM

## 2016-12-20 DIAGNOSIS — Z5111 Encounter for antineoplastic chemotherapy: Secondary | ICD-10-CM

## 2016-12-20 DIAGNOSIS — C50911 Malignant neoplasm of unspecified site of right female breast: Secondary | ICD-10-CM

## 2016-12-20 DIAGNOSIS — C7951 Secondary malignant neoplasm of bone: Secondary | ICD-10-CM

## 2016-12-20 DIAGNOSIS — Z170 Estrogen receptor positive status [ER+]: Secondary | ICD-10-CM

## 2016-12-20 LAB — CBC WITH DIFFERENTIAL/PLATELET
BASO%: 1.4 % (ref 0.0–2.0)
Basophils Absolute: 0.1 10*3/uL (ref 0.0–0.1)
EOS ABS: 0.2 10*3/uL (ref 0.0–0.5)
EOS%: 2.5 % (ref 0.0–7.0)
HCT: 30 % — ABNORMAL LOW (ref 34.8–46.6)
HEMOGLOBIN: 9.6 g/dL — AB (ref 11.6–15.9)
LYMPH#: 1.6 10*3/uL (ref 0.9–3.3)
LYMPH%: 19.9 % (ref 14.0–49.7)
MCH: 32.3 pg (ref 25.1–34.0)
MCHC: 32 g/dL (ref 31.5–36.0)
MCV: 100.8 fL (ref 79.5–101.0)
MONO#: 0.9 10*3/uL (ref 0.1–0.9)
MONO%: 11.2 % (ref 0.0–14.0)
NEUT#: 5.1 10*3/uL (ref 1.5–6.5)
NEUT%: 65 % (ref 38.4–76.8)
PLATELETS: 84 10*3/uL — AB (ref 145–400)
RBC: 2.97 10*6/uL — AB (ref 3.70–5.45)
RDW: 17.7 % — AB (ref 11.2–14.5)
WBC: 7.8 10*3/uL (ref 3.9–10.3)

## 2016-12-20 LAB — TECHNOLOGIST REVIEW

## 2016-12-20 MED ORDER — FULVESTRANT 250 MG/5ML IM SOLN
500.0000 mg | Freq: Once | INTRAMUSCULAR | Status: AC
  Administered 2016-12-20: 500 mg via INTRAMUSCULAR
  Filled 2016-12-20: qty 10

## 2016-12-20 NOTE — Telephone Encounter (Signed)
Patient reported to flush room to have first Faslodex injection. Patient reports she went to the dentist and found she had an infection under her crown on her left. She is taking clindamycin for 10 days. Patient wanted to make Dr. Jana Hakim aware.

## 2016-12-21 LAB — CANCER ANTIGEN 27.29: CA 27.29: 1187.2 U/mL — ABNORMAL HIGH (ref 0.0–38.6)

## 2017-01-02 NOTE — Progress Notes (Signed)
Visit  [facility]  [phone] [phone]   ID: Danielle Oliver DOB: 10/25/1960  MR#: [id number]  QJF#:[id number]  Patient Care Team: Danielle Rima, MD as PCP - General (Family Medicine) Danielle Oliver, Danielle Dad, MD as Consulting Physician (Oncology) Danielle Overall, MD as Consulting Physician (General Surgery) Danielle Sartorius, MD as Consulting Physician (Otolaryngology) PCP: Danielle Rima, MD GYN: OTHER MD: Danielle Heck MD  Danielle COMPLAINT: Stage IV breast cancer  CURRENT TREATMENT: Faslodex,  denosumab/Xgeva   BREAST CANCER HISTORY: From Danielle Oliver 03/11/2014 intake note:  "She was diagnosed with stage III left breast cancer in 1991 when she was 56 years old. Unfortunately her breast cancer diagnosis and treatment history was not available for review today. Apparently she had left mastectomy, and 4 cycles of adjuvant chemotherapy under Danielle. Mariana Oliver care at our cancer center. Patient does not recall the name of the chemotherapy medication. She was also offered a clinical trial of bone marrow transplant for her breast cancer and she declined. She was followed by Danielle Oliver and had no evidence of recurrence. Her last screening mammogram was in 2009 which was benign.  She noticed a lump in her right breast about 20 weeks ago. It was hard and the tender on palpitation. She denies any skin change or nipple discharge. She has no appetite change, weight loss, or other new symptoms. She had underwent a diagnostic mammogram on 01/12/2014, which showed a 3.6 cm lobulated solid mass in right upper quadrant of right breast, and an oval axillary lymph nodes was cortical thickening. 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 Her right axillary lymph node biopsy also showed invasive adenocarcinoma."  Her subsequent history is as detailed  below  INTERVAL HISTORY: Danielle Oliver returns today for follow-up and treatment of her estrogen receptor positive breast cancer.  At the last visit here, we stopped her anastrozole and palbociclib--we had a had several delays and dose reductions because of low counts--and started her on fulvestrant, with her first dose December 20, 2016.  She is due for repeat dose today  She also receives denosumab/Xgeva every 28 days, with a dose due today. She has not had any new issues. After having the shot in her buttocks, she noticed a mal odor to her urine for a couple of days.   She was just restaged with a CTs of the chest with contrast today.  My preliminary review shows no visceral disease but we are waiting on the final reading  REVIEW OF SYSTEMS: Danielle Oliver is feeling better and has more energy. She notes she is starting to feel more like herself. She went to the dentist and found an infection under her crown. She was prescribed Clindamycin for 10 days and was referred to an Danielle Oliver. She is still experiencing mild pain. She denies unusual headaches, visual changes, nausea, vomiting, or dizziness. There has been no unusual cough, phlegm production, or pleurisy. This been no change in bowel or bladder habits. No dysuria, hematuria, and urinary frequency. She denies unexplained fatigue or unexplained weight loss, bleeding, rash, or fever. A detailed review of systems was otherwise entirely stable.   PAST MEDICAL HISTORY: Past Medical History:  Diagnosis Date  . Arthritis   . Breast cancer (Danielle Oliver)   . Dysrhythmia   . Gout   . Heart murmur    one Danielle told her, "nothing to worry about"  . Hypertension   .  Morbid obesity with body mass index of 50.0-59.9 in adult Danielle Oliver)   . Pneumonia 2013 ish  . Shortness of breath dyspnea    With exertion    PAST SURGICAL HISTORY: Past Surgical History:  Procedure Laterality Date  . BREAST LUMPECTOMY WITH RADIOACTIVE SEED LOCALIZATION Right 02/17/2015   Procedure: RIGHT  BREAST LUMPECTOMY WITH RADIOACTIVE SEED LOCALIZATION;  Surgeon: Danielle Overall, MD;  Location: Danielle Oliver;  Service: General;  Laterality: Right;  . COLONOSCOPY    . FRACTURE SURGERY Right   . MASTECTOMY Left 1992  . TONSILLECTOMY      FAMILY HISTORY Family History  Problem Relation Age of Onset  . Colon cancer Father    [name] died at 63 from colon cancer which was diagnosed shortly before his death. The patient's mother is living, age 55 as of July 2016. The patient is an only child. The only other cancer in the family to her knowledge is a paternal great aunt who developed breast cancer in her late 6s. There is no history of ovarian cancer and no other history of colon cancer in the family to her knowledge  GYNECOLOGIC HISTORY:  No LMP recorded. Patient is postmenopausal. menarche age 66 first live birth age 27 the patient is Red Danielle P3. She stopped having periods in 2013. She did not take hormone replacement.   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 She is divorced. At home is just she and her son Danielle Oliver, 9 years old. Son, Danielle Oliver, 27, lives in [name] where he works as a Fish farm manager. Son, Danielle Oliver, 25, lives in [place] and is working on a Oceanographer in counseling at [employer]. The patient has one granddaughter, Danielle Oliver, in [name]. She is not a church attender    ADVANCED DIRECTIVES: Not in place   HEALTH MAINTENANCE: Social History  Substance Use Topics  . Smoking status: Never Smoker  . Smokeless tobacco: Not on file  . Alcohol use No     Comment: rare     Colonoscopy: remote/ Danielle Oliver  PAP: 2014  Bone density:  Lipid panel:  Allergies  Allergen Reactions  . Iodinated Diagnostic Agents Anaphylaxis  . Penicillins Rash    Has patient had a PCN reaction causing immediate rash, facial/tongue/throat swelling, SOB or lightheadedness with hypotension: Yes  Has patient had a PCN reaction causing severe rash involving mucus  membranes or skin necrosis: No Has patient had a PCN reaction that required hospitalization No Has patient had a PCN reaction occurring within the last 10 years: Yes If all of the above answers are "NO", then may proceed with Cephalosporin use.    Current Outpatient Prescriptions  Medication Sig Dispense Refill  . amLODipine-olmesartan (AZOR) 10-40 MG tablet Take 1 tablet by mouth daily. 90 tablet 4  . Calcium Carb-Cholecalciferol (CALTRATE 600+D) 600-800 MG-UNIT TABS Take by mouth 2 (two) times daily. 60 tablet   . Denosumab (XGEVA [place]) Inject into the skin.    . fluticasone (FLONASE) 50 MCG/ACT nasal spray Place into both nostrils daily.    Marland Kitchen gabapentin (NEURONTIN) 300 MG capsule Take 1 capsule (300 mg total) by mouth at bedtime. 90 capsule 4  . ibuprofen (ADVIL,MOTRIN) 200 MG tablet Take 200-400 mg by mouth daily as needed. Reported on 05/22/2015    . levocetirizine (XYZAL) 5 MG tablet Take 5 mg by mouth every evening.    . predniSONE (DELTASONE) 50 MG tablet Take 1 tablet at 13 hours, 7 hours and 1 hour before procedure. 3  tablet 0   No current facility-administered medications for this visit.    Facility-Administered Medications Ordered in Other Visits  Medication Dose Route Frequency Provider Last Rate Last Dose  . denosumab (XGEVA) injection 120 mg  120 mg Subcutaneous Once Arham Symmonds, Danielle Dad, MD      . fulvestrant (FASLODEX) injection 500 mg  500 mg Intramuscular Once Jennene Downie, Danielle Dad, MD      . iopamidol (ISOVUE-300) 61 % injection             OBJECTIVE:Morbidly obese African-American woman in no acute distress  Vitals:   01/03/17 1418  BP: (!) 161/99  Pulse: (!) 113  Resp: 18  Temp: (!) 97.5 F (36.4 C)  SpO2: 95%     Body mass index is 54.35 kg/m.    ECOG FS:0 - Asymptomatic  Sclerae unicteric, EOMs intact Oropharynx clear and moist No cervical or supraclavicular adenopathy Lungs no rales or rhonchi Heart regular rate and rhythm Abd soft, nontender, positive  bowel sounds MSK no focal spinal tenderness, no upper extremity lymphedema Neuro: nonfocal, well oriented, appropriate affect Breasts: Deferred   LAB RESULTS:  CMP     Component Value Date/Time   NA 141 01/03/2017 1158   K 4.0 01/03/2017 1158   CL 110 02/16/2015 1152   CO2 22 01/03/2017 1158   GLUCOSE 138 01/03/2017 1158   BUN 12.4 01/03/2017 1158   CREATININE 0.9 01/03/2017 1158   CALCIUM 9.1 01/03/2017 1158   PROT 8.0 01/03/2017 1158   ALBUMIN 3.3 (L) 01/03/2017 1158   AST 59 (H) 01/03/2017 1158   ALT 56 (H) 01/03/2017 1158   ALKPHOS 189 (H) 01/03/2017 1158   BILITOT 1.87 (H) 01/03/2017 1158   GFRNONAA >60 02/16/2015 1152   GFRAA >60 02/16/2015 1152     INo results found for: SPEP, UPEP  Lab Results  Component Value Date   WBC 13.2 (H) 01/03/2017   NEUTROABS 8.6 (H) 01/03/2017   HGB 10.8 (L) 01/03/2017   HCT 35.1 01/03/2017   MCV 102.0 (H) 01/03/2017   PLT 92 (L) 01/03/2017      Chemistry      Component Value Date/Time   NA 141 01/03/2017 1158   K 4.0 01/03/2017 1158   CL 110 02/16/2015 1152   CO2 22 01/03/2017 1158   BUN 12.4 01/03/2017 1158   CREATININE 0.9 01/03/2017 1158      Component Value Date/Time   CALCIUM 9.1 01/03/2017 1158   ALKPHOS 189 (H) 01/03/2017 1158   AST 59 (H) 01/03/2017 1158   ALT 56 (H) 01/03/2017 1158   BILITOT 1.87 (H) 01/03/2017 1158       Lab Results  Component Value Date   LABCA2 85 (H) 05/22/2015    No components found for: [id number]  No results for input(s): INR in the last 168 hours.  Urinalysis    Component Value Date/Time   LABSPEC 1.015 04/27/2007 1654   PHURINE 6.0 04/27/2007 1654   GLUCOSEU NEGATIVE 04/27/2007 1654   HGBUR NEGATIVE 04/27/2007 1654   BILIRUBINUR NEGATIVE 04/27/2007 1654   KETONESUR NEGATIVE 04/27/2007 1654   PROTEINUR NEGATIVE 04/27/2007 1654   UROBILINOGEN 0.2 04/27/2007 1654   NITRITE NEGATIVE 04/27/2007 1654   LEUKOCYTESUR  04/27/2007 1654    NEGATIVE Biochemical Testing Only.  Please order routine urinalysis from main lab if confirmatory testing is needed.   STUDIES:  CT scan of the chest today, final results pending, preliminary results no visceral disease  ASSESSMENT: 56 y.o. [place] woman  (1) status post  left mastectomy in 1991 followed by adjuvant chemotherapy Consisting of doxorubicin and cyclophosphamide given every 21 days 4 (records under "media")  (2) status post right breast upper outer quadrant biopsy 01/13/2014 for a  cT2 p N1, stage IIB invasive ductal carcinoma, estrogen and progesterone receptor positive, HER-2 negative, with an MIB-1 of 10%.  METASTATIC DISEASE: CT scan 03/18/2014 shows multiple sclerotic bone mets but no visceral disease  (1) no bone biopsy obtained to this point  (b) CA 27-29 is informative  (3) anastrozole started Danielle 2016, discontinued September 2018 because of persistent low counts with palbociclib  (a)  palbociclib added 11/21/2014 at 125 mg daily, 21/7  (b) Palbociclib dose decreased to 100 mg daily, 21/ 7, with cycle 2, starting 12/21/2014  (c) palbociclib held after 11/07/2016 because of continuing low counts  (4) vitamin D deficiency: On replacement  (5) status post right lumpectomy 02/17/2015 for a pT2 pNX invasive ductal carcinoma, grade 1, estrogen receptor 100% positive, progesterone receptor 20% positive, HER-2 not amplified. Margins were negative  (a) radiation therapy to lumpectomy site declined by patient  (6) denosumab/ Xgeva started 12/15/2014, repeated monthly  (7) genetics testing discussed--patient demurs  (8) started fulvestrant 12/20/2016   PLAN:  Danielle Oliver is now nearly 3 years out from definitive diagnosis of metastatic breast cancer, with no clinical symptoms of active disease.  This is very favorable.  She is tolerating the fulvestrant/denosumab treatments much better, with more energy and less side effects.  This is also favorable.  My reading of the CT today so far is good.  I am  waiting on the final radiology reading and she will get a call with those.  Also she needs a bone scan which we are scheduling at the end of this year.  The plan at this point is to continue fulvestrant and denosumab/Xgeva every 28 days indefinitely until there is evidence of disease progression  She does have problems with her soft tissues in the left jaw area.  She is going to be seeing Danielle. Romie Minus.  Hopefully she will not need extractions.  Otherwise she will see me again in Danielle.  She knows to call for any problems that may develop before the next visit.  Gibril Mastro, Danielle Dad, MD  01/03/17 2:49 PM Medical Oncology and Hematology [facility] [address] [place], [place] [address] Tel. [phone]    Fax. [phone]  This document serves as a record of services personally performed by Chauncey Cruel, MD. It was created on his behalf by Margit Banda, a trained medical scribe. The creation of this record is based on the scribe's personal observations and the provider's statements to them. This document has been checked and approved by the attending provider.

## 2017-01-03 ENCOUNTER — Ambulatory Visit (HOSPITAL_COMMUNITY)
Admission: RE | Admit: 2017-01-03 | Discharge: 2017-01-03 | Disposition: A | Discharge status: Home / Self Care | Payer: BC Managed Care – PPO | Source: Ambulatory Visit | Attending: Oncology | Admitting: Oncology

## 2017-01-03 ENCOUNTER — Ambulatory Visit (HOSPITAL_BASED_OUTPATIENT_CLINIC_OR_DEPARTMENT_OTHER): Payer: BC Managed Care – PPO | Admitting: Oncology

## 2017-01-03 ENCOUNTER — Telehealth: Payer: Self-pay | Admitting: Oncology

## 2017-01-03 ENCOUNTER — Other Ambulatory Visit (HOSPITAL_BASED_OUTPATIENT_CLINIC_OR_DEPARTMENT_OTHER): Payer: BC Managed Care – PPO

## 2017-01-03 ENCOUNTER — Ambulatory Visit (HOSPITAL_BASED_OUTPATIENT_CLINIC_OR_DEPARTMENT_OTHER): Payer: BC Managed Care – PPO

## 2017-01-03 VITALS — BP 161/99 | HR 113 | Temp 97.5°F | Resp 18 | Ht 67.0 in | Wt 347.0 lb

## 2017-01-03 DIAGNOSIS — R918 Other nonspecific abnormal finding of lung field: Secondary | ICD-10-CM | POA: Insufficient documentation

## 2017-01-03 DIAGNOSIS — C7951 Secondary malignant neoplasm of bone: Secondary | ICD-10-CM

## 2017-01-03 DIAGNOSIS — C50411 Malignant neoplasm of upper-outer quadrant of right female breast: Secondary | ICD-10-CM | POA: Insufficient documentation

## 2017-01-03 DIAGNOSIS — R932 Abnormal findings on diagnostic imaging of liver and biliary tract: Secondary | ICD-10-CM | POA: Diagnosis not present

## 2017-01-03 DIAGNOSIS — K8020 Calculus of gallbladder without cholecystitis without obstruction: Secondary | ICD-10-CM | POA: Insufficient documentation

## 2017-01-03 DIAGNOSIS — C50911 Malignant neoplasm of unspecified site of right female breast: Secondary | ICD-10-CM

## 2017-01-03 DIAGNOSIS — Z170 Estrogen receptor positive status [ER+]: Secondary | ICD-10-CM

## 2017-01-03 DIAGNOSIS — E559 Vitamin D deficiency, unspecified: Secondary | ICD-10-CM | POA: Diagnosis not present

## 2017-01-03 LAB — COMPREHENSIVE METABOLIC PANEL
ALBUMIN: 3.3 g/dL — AB (ref 3.5–5.0)
ALK PHOS: 189 U/L — AB (ref 40–150)
ALT: 56 U/L — ABNORMAL HIGH (ref 0–55)
AST: 59 U/L — AB (ref 5–34)
Anion Gap: 12 mEq/L — ABNORMAL HIGH (ref 3–11)
BILIRUBIN TOTAL: 1.87 mg/dL — AB (ref 0.20–1.20)
BUN: 12.4 mg/dL (ref 7.0–26.0)
CO2: 22 meq/L (ref 22–29)
Calcium: 9.1 mg/dL (ref 8.4–10.4)
Chloride: 107 mEq/L (ref 98–109)
Creatinine: 0.9 mg/dL (ref 0.6–1.1)
EGFR: >60 mL/min/{1.73_m2} (ref >60)
GLUCOSE: 138 mg/dL (ref 70–140)
Potassium: 4 mEq/L (ref 3.5–5.1)
SODIUM: 141 meq/L (ref 136–145)
TOTAL PROTEIN: 8 g/dL (ref 6.4–8.3)

## 2017-01-03 LAB — CBC WITH DIFFERENTIAL/PLATELET
BASO%: 2.1 % — AB (ref 0.0–2.0)
BASOS ABS: 0.3 10*3/uL — AB (ref 0.0–0.1)
EOS%: 2.2 % (ref 0.0–7.0)
Eosinophils Absolute: 0.3 10*3/uL (ref 0.0–0.5)
HEMATOCRIT: 35.1 % (ref 34.8–46.6)
HEMOGLOBIN: 10.8 g/dL — AB (ref 11.6–15.9)
LYMPH%: 22.1 % (ref 14.0–49.7)
MCH: 31.4 pg (ref 25.1–34.0)
MCHC: 30.8 g/dL — AB (ref 31.5–36.0)
MCV: 102 fL — AB (ref 79.5–101.0)
MONO#: 1.1 10*3/uL — AB (ref 0.1–0.9)
MONO%: 8.5 % (ref 0.0–14.0)
NEUT#: 8.6 10*3/uL — ABNORMAL HIGH (ref 1.5–6.5)
NEUT%: 65.1 % (ref 38.4–76.8)
Platelets: 92 10*3/uL — ABNORMAL LOW (ref 145–400)
RBC: 3.44 10*6/uL — ABNORMAL LOW (ref 3.70–5.45)
RDW: 17.2 % — ABNORMAL HIGH (ref 11.2–14.5)
WBC: 13.2 10*3/uL — ABNORMAL HIGH (ref 3.9–10.3)
lymph#: 2.9 10*3/uL (ref 0.9–3.3)
nRBC: 10 % — ABNORMAL HIGH (ref 0–0)

## 2017-01-03 LAB — TECHNOLOGIST REVIEW: Technologist Review: 1

## 2017-01-03 MED ORDER — IOPAMIDOL (ISOVUE-300) INJECTION 61%
75.0000 mL | Freq: Once | INTRAVENOUS | Status: AC | PRN
  Administered 2017-01-03: 75 mL via INTRAVENOUS

## 2017-01-03 MED ORDER — DENOSUMAB 120 MG/1.7ML ~~LOC~~ SOLN
120.0000 mg | Freq: Once | SUBCUTANEOUS | Status: AC
  Administered 2017-01-03: 120 mg via SUBCUTANEOUS
  Filled 2017-01-03: qty 1.7

## 2017-01-03 MED ORDER — IOPAMIDOL (ISOVUE-300) INJECTION 61%
INTRAVENOUS | Status: AC
  Filled 2017-01-03: qty 75

## 2017-01-03 MED ORDER — FULVESTRANT 250 MG/5ML IM SOLN
500.0000 mg | Freq: Once | INTRAMUSCULAR | Status: DC
  Filled 2017-01-03: qty 10

## 2017-01-03 NOTE — Telephone Encounter (Signed)
Gave patient calendar with appts per 11/2 los

## 2017-01-04 LAB — CANCER ANTIGEN 27.29: CA 27.29: 1818.3 U/mL — ABNORMAL HIGH (ref 0.0–38.6)

## 2017-01-17 ENCOUNTER — Other Ambulatory Visit (HOSPITAL_BASED_OUTPATIENT_CLINIC_OR_DEPARTMENT_OTHER): Payer: BC Managed Care – PPO

## 2017-01-17 ENCOUNTER — Ambulatory Visit (HOSPITAL_BASED_OUTPATIENT_CLINIC_OR_DEPARTMENT_OTHER): Payer: BC Managed Care – PPO

## 2017-01-17 DIAGNOSIS — Z5111 Encounter for antineoplastic chemotherapy: Secondary | ICD-10-CM | POA: Diagnosis not present

## 2017-01-17 DIAGNOSIS — C50411 Malignant neoplasm of upper-outer quadrant of right female breast: Secondary | ICD-10-CM | POA: Diagnosis not present

## 2017-01-17 DIAGNOSIS — C7951 Secondary malignant neoplasm of bone: Secondary | ICD-10-CM

## 2017-01-17 DIAGNOSIS — C50911 Malignant neoplasm of unspecified site of right female breast: Secondary | ICD-10-CM

## 2017-01-17 DIAGNOSIS — Z170 Estrogen receptor positive status [ER+]: Principal | ICD-10-CM

## 2017-01-17 LAB — CBC WITH DIFFERENTIAL/PLATELET
BASO%: 3.4 % — ABNORMAL HIGH (ref 0.0–2.0)
BASOS ABS: 0.3 10*3/uL — AB (ref 0.0–0.1)
EOS%: 2.6 % (ref 0.0–7.0)
Eosinophils Absolute: 0.3 10*3/uL (ref 0.0–0.5)
HCT: 32.8 % — ABNORMAL LOW (ref 34.8–46.6)
HGB: 10.2 g/dL — ABNORMAL LOW (ref 11.6–15.9)
LYMPH%: 25.9 % (ref 14.0–49.7)
MCH: 31.9 pg (ref 25.1–34.0)
MCHC: 31.1 g/dL — AB (ref 31.5–36.0)
MCV: 102.5 fL — AB (ref 79.5–101.0)
MONO#: 0.9 10*3/uL (ref 0.1–0.9)
MONO%: 9.2 % (ref 0.0–14.0)
NEUT#: 5.7 10*3/uL (ref 1.5–6.5)
NEUT%: 58.9 % (ref 38.4–76.8)
NRBC: 11 % — AB (ref 0–0)
Platelets: 42 10*3/uL — ABNORMAL LOW (ref 145–400)
RBC: 3.2 10*6/uL — AB (ref 3.70–5.45)
RDW: 17.7 % — AB (ref 11.2–14.5)
WBC: 9.7 10*3/uL (ref 3.9–10.3)
lymph#: 2.5 10*3/uL (ref 0.9–3.3)

## 2017-01-17 LAB — TECHNOLOGIST REVIEW

## 2017-01-17 MED ORDER — FULVESTRANT 250 MG/5ML IM SOLN
500.0000 mg | Freq: Once | INTRAMUSCULAR | Status: AC
  Administered 2017-01-17: 500 mg via INTRAMUSCULAR
  Filled 2017-01-17: qty 10

## 2017-01-18 LAB — CANCER ANTIGEN 27.29: CA 27.29: 2205.8 U/mL — ABNORMAL HIGH (ref 0.0–38.6)

## 2017-02-11 ENCOUNTER — Other Ambulatory Visit: Payer: Self-pay | Admitting: Oncology

## 2017-02-11 NOTE — Progress Notes (Unsigned)
Visit  [place]  [phone] [phone]   ID: Danielle Oliver DOB: Mar 19, 1960  MR#: [id number]  XVQ#:[id number]  Patient Care Team: Danielle Rima, Oliver as PCP - General (Family Medicine) Danielle Oliver, Danielle Dad, Oliver as Consulting Physician (Oncology) Danielle Overall, Oliver as Consulting Physician (General Surgery) Danielle Sartorius, Oliver as Consulting Physician (Otolaryngology) Danielle Oliver (Inactive) PCP: Danielle Rima, Oliver GYN: Danielle Oliver: Danielle Heck Oliver  Danielle COMPLAINT: Stage IV breast cancer  CURRENT TREATMENT: Faslodex,  denosumab/Xgeva   BREAST CANCER HISTORY: From Danielle Oliver [facility] 03/11/2014 intake note:  "She was diagnosed with stage III left breast cancer in 1991 when she was 56 years old. Unfortunately her breast cancer diagnosis and treatment history was not available for review today. Apparently she had left mastectomy, and 4 cycles of adjuvant chemotherapy under Danielle. Mariana Oliver care at our cancer center. Patient does not recall the name of the chemotherapy medication. She was also offered a clinical trial of bone marrow transplant for her breast cancer and she declined. She was followed by January mammograms and had no evidence of recurrence. Her last screening mammogram was in 2009 which was benign.  She noticed a lump in her right breast about 20 weeks ago. It was hard and the tender on palpitation. She denies any skin change or nipple discharge. She has no appetite change, weight loss, or Danielle new symptoms. She had underwent a diagnostic mammogram on 01/12/2014, which showed a 3.6 cm lobulated solid mass in right upper quadrant of right breast, and an oval axillary lymph nodes was cortical thickening. 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 Her right axillary lymph node biopsy also showed invasive adenocarcinoma."  Her subsequent history  is as detailed below  INTERVAL HISTORY: Danielle Oliver returns today for follow-up and treatment of her estrogen receptor positive breast cancer.  At the last visit here, we stopped her anastrozole and palbociclib--we had a had several delays and dose reductions because of low counts--and started her on fulvestrant, with her first dose December 20, 2016.  She is due for repeat dose today  She also receives denosumab/Xgeva every 28 days, with a dose due today. She has not had any new issues. After having the shot in her buttocks, she noticed a mal odor to her urine for a couple of days.   She was just restaged with a CTs of the chest with contrast today.  My preliminary review shows no visceral disease but we are waiting on the final reading  REVIEW OF SYSTEMS: Danielle Oliver is feeling better and has more energy. She notes she is starting to feel more like herself. She went to the dentist and found an infection under her crown. She was prescribed Clindamycin for 10 days and was referred to an Danielle Oliver. She is still experiencing mild pain. She denies unusual headaches, visual changes, nausea, vomiting, or dizziness. There has been no unusual cough, phlegm production, or pleurisy. This been no change in bowel or bladder habits. No dysuria, hematuria, and urinary frequency. She denies unexplained fatigue or unexplained weight loss, bleeding, rash, or fever. A detailed review of systems was otherwise entirely stable.   PAST MEDICAL HISTORY: Past Medical History:  Diagnosis Date  . Arthritis   . Breast cancer ([place])   . Dysrhythmia   . Gout   . Heart murmur    one Danielle told her, "nothing to worry about"  .  Hypertension   . Morbid obesity with body mass index of 50.0-59.9 in adult [facility])   . Pneumonia 2013 ish  . Shortness of breath dyspnea    With exertion    PAST SURGICAL HISTORY: Past Surgical History:  Procedure Laterality Date  . BREAST LUMPECTOMY WITH RADIOACTIVE SEED LOCALIZATION Right 02/17/2015    Procedure: RIGHT BREAST LUMPECTOMY WITH RADIOACTIVE SEED LOCALIZATION;  Surgeon: Danielle Overall, Oliver;  Location: [place];  Service: General;  Laterality: Right;  . COLONOSCOPY    . FRACTURE SURGERY Right   . MASTECTOMY Left 1992  . TONSILLECTOMY      FAMILY HISTORY Family History  Problem Relation Age of Onset  . Colon cancer Father    [name] died at 60 from colon cancer which was diagnosed shortly before his death. The patient's mother is living, age 54 as of July 2016. The patient is an only child. The only Danielle cancer in the family to her knowledge is a paternal great aunt who developed breast cancer in her late 41s. There is no history of ovarian cancer and no Danielle history of colon cancer in the family to her knowledge  GYNECOLOGIC HISTORY:  No LMP recorded. Patient is postmenopausal. menarche age 42 first live birth age 82 the patient is [place] P3. She stopped having periods in 2013. She did not take hormone replacement.   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 She is divorced. At home is just she and her son Danielle Oliver, 45 years old. Son, Danielle Oliver, 27, lives in [name] where he works as a Fish farm manager. Son, Danielle Oliver, 25, lives in [place] and is working on a Oceanographer in counseling at [employer]. The patient has one granddaughter, Danielle Oliver, in [name]. She is not a church attender    ADVANCED DIRECTIVES: Not in place   HEALTH MAINTENANCE: Social History   Tobacco Use  . Smoking status: Never Smoker  Substance Use Topics  . Alcohol use: No    Comment: rare  . Drug use: No     Colonoscopy: remote/ Danielle Oliver  PAP: 2014  Bone density:  Lipid panel:  Allergies  Allergen Reactions  . Iodinated Diagnostic Agents Anaphylaxis  . Penicillins Rash    Has patient had a PCN reaction causing immediate rash, facial/tongue/throat swelling, SOB or lightheadedness with hypotension: Yes  Has patient had a PCN reaction causing severe rash  involving mucus membranes or skin necrosis: No Has patient had a PCN reaction that required hospitalization No Has patient had a PCN reaction occurring within the last 10 years: Yes If all of the above answers are "NO", then may proceed with Cephalosporin use.    Current Outpatient Medications  Medication Sig Dispense Refill  . amLODipine-olmesartan (AZOR) 10-40 MG tablet Take 1 tablet by mouth daily. 90 tablet 4  . Calcium Carb-Cholecalciferol (CALTRATE 600+D) 600-800 MG-UNIT TABS Take by mouth 2 (two) times daily. 60 tablet   . Denosumab (XGEVA [place]) Inject into the skin.    . fluticasone (FLONASE) 50 MCG/ACT nasal spray Place into both nostrils daily.    Marland Kitchen gabapentin (NEURONTIN) 300 MG capsule Take 1 capsule (300 mg total) by mouth at bedtime. 90 capsule 4  . ibuprofen (ADVIL,MOTRIN) 200 MG tablet Take 200-400 mg by mouth daily as needed. Reported on 05/22/2015    . levocetirizine (XYZAL) 5 MG tablet Take 5 mg by mouth every evening.    . predniSONE (DELTASONE) 50 MG tablet Take 1 tablet at 13 hours, 7 hours and  1 hour before procedure. 3 tablet 0   No current facility-administered medications for this visit.     OBJECTIVE:Morbidly obese African-American woman in no acute distress  There were no vitals filed for this visit.   There is no height or weight on file to calculate BMI.    ECOG FS:0 - Asymptomatic  Sclerae unicteric, EOMs intact Oropharynx clear and moist No cervical or supraclavicular adenopathy Lungs no rales or rhonchi Heart regular rate and rhythm Abd soft, nontender, positive bowel sounds MSK no focal spinal tenderness, no upper extremity lymphedema Neuro: nonfocal, well oriented, appropriate affect Breasts: Deferred   LAB RESULTS:  CMP     Component Value Date/Time   NA 141 01/03/2017 1158   K 4.0 01/03/2017 1158   CL 110 02/16/2015 1152   CO2 22 01/03/2017 1158   GLUCOSE 138 01/03/2017 1158   BUN 12.4 01/03/2017 1158   CREATININE 0.9 01/03/2017 1158    CALCIUM 9.1 01/03/2017 1158   PROT 8.0 01/03/2017 1158   ALBUMIN 3.3 (L) 01/03/2017 1158   AST 59 (H) 01/03/2017 1158   ALT 56 (H) 01/03/2017 1158   ALKPHOS 189 (H) 01/03/2017 1158   BILITOT 1.87 (H) 01/03/2017 1158   GFRNONAA >60 02/16/2015 1152   GFRAA >60 02/16/2015 1152     INo results found for: SPEP, UPEP  Lab Results  Component Value Date   WBC 9.7 01/17/2017   NEUTROABS 5.7 01/17/2017   HGB 10.2 (L) 01/17/2017   HCT 32.8 (L) 01/17/2017   MCV 102.5 (H) 01/17/2017   PLT 42 (L) 01/17/2017      Chemistry      Component Value Date/Time   NA 141 01/03/2017 1158   K 4.0 01/03/2017 1158   CL 110 02/16/2015 1152   CO2 22 01/03/2017 1158   BUN 12.4 01/03/2017 1158   CREATININE 0.9 01/03/2017 1158      Component Value Date/Time   CALCIUM 9.1 01/03/2017 1158   ALKPHOS 189 (H) 01/03/2017 1158   AST 59 (H) 01/03/2017 1158   ALT 56 (H) 01/03/2017 1158   BILITOT 1.87 (H) 01/03/2017 1158       Lab Results  Component Value Date   LABCA2 85 (H) 05/22/2015    No components found for: NOTRR116  No results for input(s): INR in the last 168 hours.  Urinalysis    Component Value Date/Time   LABSPEC 1.015 04/27/2007 1654   PHURINE 6.0 04/27/2007 1654   GLUCOSEU NEGATIVE 04/27/2007 1654   HGBUR NEGATIVE 04/27/2007 1654   BILIRUBINUR NEGATIVE 04/27/2007 1654   KETONESUR NEGATIVE 04/27/2007 1654   PROTEINUR NEGATIVE 04/27/2007 1654   UROBILINOGEN 0.2 04/27/2007 1654   NITRITE NEGATIVE 04/27/2007 1654   LEUKOCYTESUR  04/27/2007 1654    NEGATIVE Biochemical Testing Only. Please order routine urinalysis from main lab if confirmatory testing is needed.   STUDIES:  CT scan of the chest today, final results pending, preliminary results no visceral disease  ASSESSMENT: 56 y.o. Danielle Oliver woman  (1) status post left mastectomy in 1991 followed by adjuvant chemotherapy Consisting of doxorubicin and cyclophosphamide given every 21 days 4 (records under "media")  (2)  status post right breast upper outer quadrant biopsy 01/13/2014 for a  cT2 p N1, stage IIB invasive ductal carcinoma, estrogen and progesterone receptor positive, HER-2 negative, with an MIB-1 of 10%.  METASTATIC DISEASE: CT scan 03/18/2014 shows multiple sclerotic bone mets but no visceral disease  (1) no bone biopsy obtained to this point  (b) CA 27-29 is informative  (  3) anastrozole started January 2016, discontinued September 2018 because of persistent low counts with palbociclib  (a)  palbociclib added 11/21/2014 at 125 mg daily, 21/7  (b) Palbociclib dose decreased to 100 mg daily, 21/ 7, with cycle 2, starting 12/21/2014  (c) palbociclib held after 11/07/2016 because of continuing low counts  (4) vitamin D deficiency: On replacement  (5) status post right lumpectomy 02/17/2015 for a pT2 pNX invasive ductal carcinoma, grade 1, estrogen receptor 100% positive, progesterone receptor 20% positive, HER-2 not amplified. Margins were negative  (a) radiation therapy to lumpectomy site declined by patient  (6) denosumab/ Xgeva started 12/15/2014, repeated monthly  (7) genetics testing discussed--patient demurs  (8) started fulvestrant 12/20/2016   PLAN:  Danielle Oliver is now nearly 3 years out from definitive diagnosis of metastatic breast cancer, with no clinical symptoms of active disease.  This is very favorable.  She is tolerating the fulvestrant/denosumab treatments much better, with more energy and less side effects.  This is also favorable.  My reading of the CT today so far is good.  I am waiting on the final radiology reading and she will get a call with those.  Also she needs a bone scan which we are scheduling at the end of this year.  The plan at this point is to continue fulvestrant and denosumab/Xgeva every 28 days indefinitely until there is evidence of disease progression  She does have problems with her soft tissues in the left jaw area.  She is going to be seeing Danielle. Romie Minus.   Hopefully she will not need extractions.  Otherwise she will see me again in January.  She knows to call for any problems that may develop before the next visit.  Taelyn Broecker, Danielle Dad, Oliver  02/11/17 2:26 PM Medical Oncology and Hematology [facility] [address] [place], [place] [address] Tel. [phone]    Fax. [phone]  This document serves as a record of services personally performed by Chauncey Cruel, Oliver. It was created on his behalf by Margit Banda, a trained medical scribe. The creation of this record is based on the scribe's personal observations and the provider's statements to them. This document has been checked and approved by the attending provider.

## 2017-02-14 ENCOUNTER — Other Ambulatory Visit: Payer: Self-pay | Admitting: Oncology

## 2017-02-14 ENCOUNTER — Ambulatory Visit (HOSPITAL_BASED_OUTPATIENT_CLINIC_OR_DEPARTMENT_OTHER): Payer: BC Managed Care – PPO

## 2017-02-14 ENCOUNTER — Other Ambulatory Visit (HOSPITAL_BASED_OUTPATIENT_CLINIC_OR_DEPARTMENT_OTHER): Payer: BC Managed Care – PPO

## 2017-02-14 VITALS — BP 135/70 | HR 95 | Temp 97.8°F | Resp 18

## 2017-02-14 DIAGNOSIS — Z5111 Encounter for antineoplastic chemotherapy: Secondary | ICD-10-CM

## 2017-02-14 DIAGNOSIS — C7951 Secondary malignant neoplasm of bone: Secondary | ICD-10-CM

## 2017-02-14 DIAGNOSIS — C50411 Malignant neoplasm of upper-outer quadrant of right female breast: Secondary | ICD-10-CM

## 2017-02-14 DIAGNOSIS — C50911 Malignant neoplasm of unspecified site of right female breast: Secondary | ICD-10-CM

## 2017-02-14 DIAGNOSIS — Z170 Estrogen receptor positive status [ER+]: Principal | ICD-10-CM

## 2017-02-14 DIAGNOSIS — K769 Liver disease, unspecified: Secondary | ICD-10-CM

## 2017-02-14 LAB — CBC WITH DIFFERENTIAL/PLATELET
BASO%: 6.3 % — AB (ref 0.0–2.0)
Basophils Absolute: 0.7 10*3/uL — ABNORMAL HIGH (ref 0.0–0.1)
EOS%: 4.6 % (ref 0.0–7.0)
Eosinophils Absolute: 0.5 10*3/uL (ref 0.0–0.5)
HEMATOCRIT: 31.4 % — AB (ref 34.8–46.6)
HGB: 9.5 g/dL — ABNORMAL LOW (ref 11.6–15.9)
LYMPH%: 27.2 % (ref 14.0–49.7)
MCH: 31.7 pg (ref 25.1–34.0)
MCHC: 30.3 g/dL — AB (ref 31.5–36.0)
MCV: 104.7 fL — ABNORMAL HIGH (ref 79.5–101.0)
MONO#: 1.3 10*3/uL — ABNORMAL HIGH (ref 0.1–0.9)
MONO%: 12.9 % (ref 0.0–14.0)
NEUT%: 49 % (ref 38.4–76.8)
NEUTROS ABS: 5.1 10*3/uL (ref 1.5–6.5)
Platelets: 20 10*3/uL — ABNORMAL LOW (ref 145–400)
RBC: 3 10*6/uL — AB (ref 3.70–5.45)
RDW: 19.9 % — ABNORMAL HIGH (ref 11.2–14.5)
WBC: 10.4 10*3/uL — AB (ref 3.9–10.3)
lymph#: 2.8 10*3/uL (ref 0.9–3.3)
nRBC: 37 % — ABNORMAL HIGH (ref 0–0)

## 2017-02-14 LAB — COMPREHENSIVE METABOLIC PANEL
ALT: 53 U/L (ref 0–55)
AST: 94 U/L — AB (ref 5–34)
Albumin: 2.4 g/dL — ABNORMAL LOW (ref 3.5–5.0)
Alkaline Phosphatase: 337 U/L — ABNORMAL HIGH (ref 40–150)
Anion Gap: 14 mEq/L — ABNORMAL HIGH (ref 3–11)
BUN: 14.7 mg/dL (ref 7.0–26.0)
CALCIUM: 8.7 mg/dL (ref 8.4–10.4)
CHLORIDE: 106 meq/L (ref 98–109)
CO2: 19 meq/L — AB (ref 22–29)
Creatinine: 1.1 mg/dL (ref 0.6–1.1)
EGFR: >60 mL/min/{1.73_m2} (ref >60)
Glucose: 134 mg/dl (ref 70–140)
POTASSIUM: 3.6 meq/L (ref 3.5–5.1)
Sodium: 139 mEq/L (ref 136–145)
Total Bilirubin: 5.29 mg/dL (ref 0.20–1.20)
Total Protein: 7.1 g/dL (ref 6.4–8.3)

## 2017-02-14 LAB — TECHNOLOGIST REVIEW

## 2017-02-14 MED ORDER — FULVESTRANT 250 MG/5ML IM SOLN
500.0000 mg | Freq: Once | INTRAMUSCULAR | Status: AC
  Administered 2017-02-14: 500 mg via INTRAMUSCULAR

## 2017-02-14 MED ORDER — DENOSUMAB 120 MG/1.7ML ~~LOC~~ SOLN
120.0000 mg | Freq: Once | SUBCUTANEOUS | Status: AC
  Administered 2017-02-14: 120 mg via SUBCUTANEOUS

## 2017-02-14 NOTE — Progress Notes (Unsigned)
Danielle Oliver came today for her regular treatment and we obtain the usual labs.  Her bili has jumped to a little bit over 5 and her alkaline phosphatase is also more elevated.  The transaminases are not significantly different and we know that she has fatty liver.  Nevertheless this is of concern.  This could be biliary obstruction or it could relate to metastases to the liver.  The quickest test we can obtain for her has been scheduled for Monday 1217 at 1130.  She knows to not have breakfast that day.  I have asked her to have a very simple diet this weekend and if she develops a temperature above 100 or significant pain in the abdomen she is to go straight to the emergency room  I am also setting her up for an MRI of the liver hopefully sometime next week.  I wrote her a prescription for lorazepam that she can take to help her get through that test.  I also wrote her for a Z-Pak for her sinus symptoms  She will call us with any other issues that may develop before her next visit

## 2017-02-15 LAB — CANCER ANTIGEN 27.29

## 2017-02-17 ENCOUNTER — Other Ambulatory Visit: Payer: Self-pay | Admitting: Oncology

## 2017-02-17 ENCOUNTER — Ambulatory Visit (HOSPITAL_COMMUNITY)
Admission: RE | Admit: 2017-02-17 | Discharge: 2017-02-17 | Disposition: A | Discharge status: Home / Self Care | Payer: BC Managed Care – PPO | Source: Ambulatory Visit | Attending: Oncology | Admitting: Oncology

## 2017-02-17 ENCOUNTER — Encounter (HOSPITAL_COMMUNITY): Payer: Self-pay

## 2017-02-17 ENCOUNTER — Ambulatory Visit (HOSPITAL_COMMUNITY): Admission: RE | Admit: 2017-02-17 | Payer: BC Managed Care – PPO | Source: Ambulatory Visit

## 2017-02-17 ENCOUNTER — Ambulatory Visit: Payer: BC Managed Care – PPO | Admitting: Adult Health

## 2017-02-17 DIAGNOSIS — K8020 Calculus of gallbladder without cholecystitis without obstruction: Secondary | ICD-10-CM | POA: Diagnosis not present

## 2017-02-17 DIAGNOSIS — K769 Liver disease, unspecified: Secondary | ICD-10-CM | POA: Insufficient documentation

## 2017-02-17 DIAGNOSIS — R160 Hepatomegaly, not elsewhere classified: Secondary | ICD-10-CM | POA: Insufficient documentation

## 2017-02-17 NOTE — Progress Notes (Unsigned)
I called Danielle Oliver and gave her the report on her ultrasound which confirms multiple liver lesions.  This is the reason she has a dull ache in that area and the reason her liver function tests and CA-27-29 are so abnormal.  She is going to come to see me 02/19/2017 at 4 PM.  At that time we will discuss chemotherapy options.

## 2017-02-18 ENCOUNTER — Telehealth: Payer: Self-pay | Admitting: Oncology

## 2017-02-18 NOTE — Telephone Encounter (Signed)
Added appt per 12/17 sch msg - patient is aware.

## 2017-02-19 ENCOUNTER — Ambulatory Visit (HOSPITAL_BASED_OUTPATIENT_CLINIC_OR_DEPARTMENT_OTHER): Payer: BC Managed Care – PPO | Admitting: Oncology

## 2017-02-19 ENCOUNTER — Telehealth: Payer: Self-pay | Admitting: Oncology

## 2017-02-19 VITALS — BP 135/80 | HR 104 | Temp 97.5°F | Resp 17 | Ht 67.0 in | Wt 331.7 lb

## 2017-02-19 DIAGNOSIS — C50919 Malignant neoplasm of unspecified site of unspecified female breast: Secondary | ICD-10-CM

## 2017-02-19 DIAGNOSIS — D696 Thrombocytopenia, unspecified: Secondary | ICD-10-CM

## 2017-02-19 DIAGNOSIS — C50411 Malignant neoplasm of upper-outer quadrant of right female breast: Secondary | ICD-10-CM

## 2017-02-19 DIAGNOSIS — Z170 Estrogen receptor positive status [ER+]: Secondary | ICD-10-CM | POA: Diagnosis not present

## 2017-02-19 DIAGNOSIS — C7951 Secondary malignant neoplasm of bone: Secondary | ICD-10-CM | POA: Diagnosis not present

## 2017-02-19 DIAGNOSIS — C787 Secondary malignant neoplasm of liver and intrahepatic bile duct: Secondary | ICD-10-CM | POA: Insufficient documentation

## 2017-02-19 MED ORDER — PREDNISONE 50 MG PO TABS: ORAL_TABLET | ORAL | 0 refills | Status: DC

## 2017-02-19 NOTE — Progress Notes (Signed)
Visit  [place]  Telephone:([phone]) [phone] Fax:(336) [phone]   ID: Danielle Oliver DOB: 1960/07/28  MR#: [id number]  FBP#:[id number]  Patient Care Team: Helane Rima, MD as PCP - General (Family Medicine) Kesean Serviss, [name], MD as Consulting Physician (Oncology) Alphonsa Overall, MD as Consulting Physician (General Surgery) [name], MD as Consulting Physician (Otolaryngology) Feliberto Harts R (Inactive) GYN: OTHER MD: [name] MD  CHIEF COMPLAINT: Stage IV breast cancer  CURRENT TREATMENT: Consider paclitaxel   BREAST CANCER HISTORY: From Dr Krista Blue Feng's 03/11/2014 intake note:  "She was diagnosed with stage III left breast cancer in 1991 when she was 56 years old. Unfortunately her breast cancer diagnosis and treatment history was not available for review today. Apparently she had left mastectomy, and 4 cycles of adjuvant chemotherapy under Dr. Mariana Kaufman care at our cancer center. Patient does not recall the name of the chemotherapy medication. She was also offered a clinical trial of bone marrow transplant for her breast cancer and she declined. She was followed by January mammograms and had no evidence of recurrence. Her last screening mammogram was in 2009 which was benign.  She noticed a lump in her right breast about 20 weeks ago. It was hard and the tender on palpitation. She denies any skin change or nipple discharge. She has no appetite change, weight loss, or other new symptoms. She had underwent a diagnostic mammogram on 01/12/2014, which showed a 3.6 cm lobulated solid mass in right upper quadrant of right breast, and an oval axillary lymph nodes was cortical thickening. 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 Her right axillary lymph node biopsy also showed invasive adenocarcinoma."  Her subsequent history is as detailed below  INTERVAL  HISTORY: Danielle Oliver returns today for follow-up of her her estrogen receptor positive breast cancer, accompanied by her mother Danielle Oliver.  At her last visit here we noted changes in Danielle Oliver's liver function tests and a further elevation in her CA-27-29 as well as symptoms consistent with her liver being involved by cancer.  This was confirmed with ultrasonography performed 02/17/2017.  This study describes innumerable small hypoechoic liver masses, the largest measuring 1.8 cm on the left and 2.1 cm on the right.  The spleen was not enlarged.  Danielle Oliver is here today to further discuss those results.  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  She feels very dizzy.  She feels very short of breath.  She has no appetite.  She denies altered taste nausea and vomiting.  She tells me she is having small but regular bowel movements.  She is having headaches which she attributes to sinus.  She tells me she has no cough but her mother tells me she has been coughing constantly.  She has been eating some crushed ice.  Otherwise she has not been drinking or eating much.  She tells me she essentially has had nothing to eat today.  She has been having some bruising and at the site of the her last Xgeva treatment there is a large ecchymosis.  Aside from these issues a detailed review of systems today was stable  PAST MEDICAL HISTORY: Past Medical History:  Diagnosis Date  . Arthritis   . Breast cancer ([place])   . Dysrhythmia   . Gout   . Heart murmur    one Dr  told her, "nothing to worry about"  . Hypertension   . Morbid obesity with body mass index of 50.0-59.9 in adult [facility])   . Pneumonia 2013 ish  . Shortness of breath dyspnea    With exertion    PAST SURGICAL HISTORY: Past Surgical History:  Procedure Laterality Date  . BREAST LUMPECTOMY WITH RADIOACTIVE SEED LOCALIZATION Right 02/17/2015   Procedure: RIGHT BREAST LUMPECTOMY WITH RADIOACTIVE SEED  LOCALIZATION;  Surgeon: Alphonsa Overall, MD;  Location: [facility];  Service: General;  Laterality: Right;  . COLONOSCOPY    . FRACTURE SURGERY Right   . MASTECTOMY Left 1992  . TONSILLECTOMY      FAMILY HISTORY Family History  Problem Relation Age of Onset  . Colon cancer Father    [name] died at 34 from colon cancer which was diagnosed shortly before his death. The patient's mother is living, age 19 as of July 2016. The patient is an only child. The only other cancer in the family to her knowledge is a paternal great aunt who developed breast cancer in her late 34s. There is no history of ovarian cancer and no other history of colon cancer in the family to her knowledge  GYNECOLOGIC HISTORY:  No LMP recorded. Patient is postmenopausal. menarche age 27 first live birth age 77 the patient is Gleed P3. She stopped having periods in 2013. She did not take hormone replacement.   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 She is divorced. At home is just she and her son Danielle Oliver, 3 years old. Son, Danielle Oliver, 27, lives in [name] where he works as a Fish farm manager. Son, Danielle Oliver, 25, lives in [place] and is working on a Oceanographer in counseling at [employer]. The patient has one granddaughter, Danielle Oliver, in [name]. She is not a church attender    ADVANCED DIRECTIVES: Not in place   HEALTH MAINTENANCE: Social History   Tobacco Use  . Smoking status: Never Smoker  Substance Use Topics  . Alcohol use: No    Comment: rare  . Drug use: No     Colonoscopy: remote/ [name]  PAP: 2014  Bone density:  Lipid panel:  Allergies  Allergen Reactions  . Iodinated Diagnostic Agents Anaphylaxis  . Penicillins Rash    Has patient had a PCN reaction causing immediate rash, facial/tongue/throat swelling, SOB or lightheadedness with hypotension: Yes  Has patient had a PCN reaction causing severe rash involving mucus membranes or skin necrosis: No Has patient had  a PCN reaction that required hospitalization No Has patient had a PCN reaction occurring within the last 10 years: Yes If all of the above answers are "NO", then may proceed with Cephalosporin use.    Current Outpatient Medications  Medication Sig Dispense Refill  . amLODipine-olmesartan (AZOR) 10-40 MG tablet Take 1 tablet by mouth daily. 90 tablet 4  . Calcium Carb-Cholecalciferol (CALTRATE 600+D) 600-800 MG-UNIT TABS Take by mouth 2 (two) times daily. 60 tablet   . Denosumab (XGEVA [place]) Inject into the skin.    . fluticasone (FLONASE) 50 MCG/ACT nasal spray Place into both nostrils daily.    Marland Kitchen gabapentin (NEURONTIN) 300 MG capsule Take 1 capsule (300 mg total) by mouth at bedtime. 90 capsule 4  . ibuprofen (ADVIL,MOTRIN) 200 MG tablet Take 200-400 mg by mouth daily as needed. Reported on 05/22/2015    . levocetirizine (XYZAL) 5 MG tablet Take 5 mg by mouth every evening.    . predniSONE (DELTASONE) 50 MG tablet Take  1 tablet at 13 hours, 7 hours and 1 hour before procedure. 3 tablet 0   No current facility-administered medications for this visit.     OBJECTIVE:Morbidly obese African-American woman who was not able to step up to the examination table today  Vitals:   02/19/17 1606  BP: 135/80  Pulse: (!) 104  Resp: 17  Temp: (!) 97.5 F (36.4 C)  SpO2: 98%     Body mass index is 51.95 kg/m.    ECOG FS:2 - Symptomatic, <50% confined to bed  Sclerae unicteric, pupils round and equal Oropharynx clear and moist No cervical or supraclavicular adenopathy Lungs no rales or rhonchi Heart regular rate and rhythm Abd soft, obese, nontender, positive bowel sounds MSK no focal spinal tenderness, no upper extremity lymphedema Neuro: nonfocal, well oriented, appropriate affect Breasts: Deferred  LAB RESULTS:  CMP     Component Value Date/Time   NA 139 02/14/2017 1515   K 3.6 02/14/2017 1515   CL 110 02/16/2015 1152   CO2 19 (L) 02/14/2017 1515   GLUCOSE 134 02/14/2017 1515   BUN  14.7 02/14/2017 1515   CREATININE 1.1 02/14/2017 1515   CALCIUM 8.7 02/14/2017 1515   PROT 7.1 02/14/2017 1515   ALBUMIN 2.4 (L) 02/14/2017 1515   AST 94 (H) 02/14/2017 1515   ALT 53 02/14/2017 1515   ALKPHOS 337 (H) 02/14/2017 1515   BILITOT 5.29 (HH) 02/14/2017 1515   GFRNONAA >60 02/16/2015 1152   GFRAA >60 02/16/2015 1152     INo results found for: SPEP, UPEP  Lab Results  Component Value Date   WBC 10.4 (H) 02/14/2017   NEUTROABS 5.1 02/14/2017   HGB 9.5 (L) 02/14/2017   HCT 31.4 (L) 02/14/2017   MCV 104.7 (H) 02/14/2017   PLT 20 few Large platelets present (L) 02/14/2017      Chemistry      Component Value Date/Time   NA 139 02/14/2017 1515   K 3.6 02/14/2017 1515   CL 110 02/16/2015 1152   CO2 19 (L) 02/14/2017 1515   BUN 14.7 02/14/2017 1515   CREATININE 1.1 02/14/2017 1515      Component Value Date/Time   CALCIUM 8.7 02/14/2017 1515   ALKPHOS 337 (H) 02/14/2017 1515   AST 94 (H) 02/14/2017 1515   ALT 53 02/14/2017 1515   BILITOT 5.29 (HH) 02/14/2017 1515       Lab Results  Component Value Date   LABCA2 85 (H) 05/22/2015    No components found for: [id number]  No results for input(s): INR in the last 168 hours.  Urinalysis    Component Value Date/Time   LABSPEC 1.015 04/27/2007 1654   PHURINE 6.0 04/27/2007 1654   GLUCOSEU NEGATIVE 04/27/2007 1654   HGBUR NEGATIVE 04/27/2007 1654   BILIRUBINUR NEGATIVE 04/27/2007 1654   KETONESUR NEGATIVE 04/27/2007 1654   PROTEINUR NEGATIVE 04/27/2007 1654   UROBILINOGEN 0.2 04/27/2007 1654   NITRITE NEGATIVE 04/27/2007 1654   LEUKOCYTESUR  04/27/2007 1654    NEGATIVE Biochemical Testing Only. Please order routine urinalysis from main lab if confirmatory testing is needed.   STUDIES: US Abdomen Complete  Result Date: 02/17/2017 CLINICAL DATA:  Right upper quadrant abdominal pain and nausea for 3 days. History of breast cancer. EXAM: ABDOMEN ULTRASOUND COMPLETE COMPARISON:  03/18/2014 CT abdomen/ pelvis.  FINDINGS: Gallbladder: Cholelithiasis, with shadowing calcified gallstones measuring up to 2.7 cm. Mild diffuse gallbladder wall thickening. No pericholecystic fluid. Sonographic Percell Miller sign is present. Common bile duct: Diameter: 3 mm Liver: Hepatomegaly. Liver parenchyma  is markedly heterogeneous, with the suggestion of innumerable small hypoechoic liver masses throughout the liver, largest 1.8 x 1.6 x 1.6 cm in the left liver lobe and 2.1 x 1.5 x 1.7 cm in the posterior right liver lobe. Portal vein is patent on color Doppler imaging with normal direction of blood flow towards the liver. IVC: No abnormality visualized. Pancreas: Visualized portion unremarkable. Spleen: Size and appearance within normal limits. Right Kidney: Length: 10.0 cm. Echogenicity within normal limits. No mass or hydronephrosis visualized. Left Kidney: Length: 10.4 cm. Echogenicity within normal limits. No mass or hydronephrosis visualized. Abdominal aorta: No aneurysm visualized. Other findings: None. IMPRESSION: 1. Cholelithiasis. Mild diffuse gallbladder wall thickening. Sonographic Percell Miller sign is present. Findings are compatible with acute calculous cholecystitis in the correct clinical setting. 2. No biliary ductal dilatation. 3. Hepatomegaly with markedly heterogeneous liver parenchyma, with the suggestion of innumerable small hypoechoic liver masses throughout the liver, worrisome for hepatic metastases given the history of breast cancer. Recommend further evaluation with either staging CT of the chest, abdomen and pelvis with IV and oral contrast, MRI abdomen without and with IV contrast or PET-CT, as clinically warranted. These results will be called to the ordering clinician or representative by the Radiologist Assistant, and communication documented in the PACS or zVision Dashboard. Electronically Signed   By: Ilona Sorrel M.D.   On: 02/17/2017 17:30   ASSESSMENT: 56 y.o. [place] woman  (1) status post left mastectomy in  1991 followed by adjuvant chemotherapy Consisting of doxorubicin and cyclophosphamide given every 21 days 4 (records under "media")  (2) status post right breast upper outer quadrant biopsy 01/13/2014 for a  cT2 p N1, stage IIB invasive ductal carcinoma, estrogen and progesterone receptor positive, HER-2 negative, with an MIB-1 of 10%.  METASTATIC DISEASE: CT scan 03/18/2014 shows multiple sclerotic bone mets but no visceral disease at that time  (1) no bone biopsy obtained to this point  (b) CA 27-29 is informative  (3) anastrozole started January 2016, discontinued September 2018 because of persistent low counts with palbociclib  (a)  palbociclib added 11/21/2014 at 125 mg daily, 21/7  (b) Palbociclib dose decreased to 100 mg daily, 21/ 7, with cycle 2, starting 12/21/2014  (c) palbociclib held after 11/07/2016 because of continuing low counts  (d) anastrozole discontinued December 2018 with evidence of disease progression  (4) vitamin D deficiency: On replacement  (5) status post right lumpectomy 02/17/2015 for a pT2 pNX invasive ductal carcinoma, grade 1, estrogen receptor 100% positive, progesterone receptor 20% positive, HER-2 not amplified. Margins were negative  (a) radiation therapy to lumpectomy site declined by patient  (6) denosumab/ Xgeva started 12/15/2014, repeated monthly  (7) genetics testing discussed--patient demurs  (8) started fulvestrant 12/20/2016, the last dose 02/14/2017, discontinued with disease progression  (9) RESTAGING December 2018  (a) abdominal ultrasound 02/17/2017 shows multiple liver lesions  (b) CT scans of the brain, chest, abdomen and pelvis pending  (c) liver biopsy pending   PLAN: I spent a little over an hour with [name] and her mother going over her situation and clarifying her options.  Sharee initially had a good response to antiestrogens, namely anastrozole and palbociclib.  Of course she also received denosumab/Xgeva for her bone  lesions.  As of September her palbociclib had to be stopped for persistent low counts.  Fulvestrant was added in October.  Despite this she now has symptoms consistent with spread to the liver and an abdominal ultrasound showing too many liver lesions to count.  We discussed  the fact that visceral disease is very different from bone disease and is more directly life-threatening.  We do need to biopsy this not only to confirm that it is cancer as it is going to be but more importantly to find out if it is still estrogen receptor positive.  If we do document estrogen receptor positivity, one option for her would be to switch to exemestane and everolimus.  However my recommendation is that we moved to chemotherapy at this point.  We would like to try something that has a good chance of a rapid response because if we have rapid growth in the liver and it is not checked we could be in significant trouble within 2-3 months  We discussed the possible toxicity side effects and complications of paclitaxel in detail.  She has no good access and will need a port.  That order has been placed.  She will also be restaged with CT's of the brain (she does not think she could go through a brain MRI), chest abdomen and pelvis.  She will then see me 03/07/2017 to discuss all these results and she will receive her first Taxol treatment that day.  Note that her most recent platelet count was 20,000.  We are going to recheck this on 02/24/2017.  If less than 50,000 she will need a platelet transfusion which is being scheduled for 1226, with a view to her liver biopsy to follow the next day.  Shamariah was understandably distressed with these developments.  She is particularly distressed at having to apply for disability since her work is very much part of her identity.  Hopefully after 3 months she may be able to return to work assuming things have improved.  She also requested a handicap sticker which I was glad to write for  her  She knows to call for any other problems that may develop before her next visit.  Zineb Glade, [name], MD  02/19/17 6:01 PM Medical Oncology and Hematology [facility] [address] [place], [place] [address] Tel. [phone]    Fax. [phone]  This document serves as a record of services personally performed by Chauncey Cruel, MD. It was created on his behalf by Margit Banda, a trained medical scribe. The creation of this record is based on the scribe's personal observations and the provider's statements to them.   I have reviewed the above documentation for accuracy and completeness, and I agree with the above.

## 2017-02-19 NOTE — Telephone Encounter (Signed)
Scheduled appt per 12/19 los - unable to add capped days - will contact patient when scheduled - gave patient AVS with all appts able to be scheduled.

## 2017-02-20 ENCOUNTER — Other Ambulatory Visit: Payer: Self-pay | Admitting: Oncology

## 2017-02-20 ENCOUNTER — Other Ambulatory Visit: Payer: Self-pay | Admitting: *Deleted

## 2017-02-20 DIAGNOSIS — C787 Secondary malignant neoplasm of liver and intrahepatic bile duct: Secondary | ICD-10-CM

## 2017-02-20 DIAGNOSIS — D696 Thrombocytopenia, unspecified: Secondary | ICD-10-CM

## 2017-02-20 NOTE — Progress Notes (Signed)
START OFF PATHWAY REGIMEN - Breast   [id number]:Gemcitabine + Carboplatin (1000/2) q21 Days:   A cycle is every 21 days:     Gemcitabine      Carboplatin   **Always confirm dose/schedule in your pharmacy ordering system**    Patient Characteristics: Metastatic Chemotherapy, HER2 Negative/Unknown/Equivocal, ER Positive, First Line Therapeutic Status: Distant Metastases BRCA Mutation Status: Did Not Order Test ER Status: Positive (+) HER2 Status: Negative (-) Would you be surprised if this patient died  in the next year<= I would NOT be surprised if this patient died in the next year PR Status: Positive (+) Line of therapy: First Line Intent of Therapy: Non-Curative / Palliative Intent, Discussed with Patient

## 2017-02-20 NOTE — Progress Notes (Unsigned)
Danielle Oliver has significantly decreased hepatic function and we are having to choose chemotherapy agents that will minimally affect the liver.  Accordingly she will be receiving carboplatin and gemcitabine on days 1 and 8 of each 21-day cycle, the carboplatin at an AUC of 2 and the Gemzar at 800 mg/M2

## 2017-02-21 NOTE — Telephone Encounter (Signed)
Confirmed appt changed to 1/4 appts - patient is aware and will pick up and updated schedule 12/24

## 2017-02-24 ENCOUNTER — Ambulatory Visit (HOSPITAL_BASED_OUTPATIENT_CLINIC_OR_DEPARTMENT_OTHER): Payer: BC Managed Care – PPO | Admitting: Adult Health

## 2017-02-24 ENCOUNTER — Other Ambulatory Visit: Payer: Self-pay

## 2017-02-24 ENCOUNTER — Encounter: Payer: Self-pay | Admitting: Adult Health

## 2017-02-24 ENCOUNTER — Other Ambulatory Visit (HOSPITAL_BASED_OUTPATIENT_CLINIC_OR_DEPARTMENT_OTHER): Payer: BC Managed Care – PPO

## 2017-02-24 ENCOUNTER — Ambulatory Visit (HOSPITAL_BASED_OUTPATIENT_CLINIC_OR_DEPARTMENT_OTHER): Payer: BC Managed Care – PPO

## 2017-02-24 ENCOUNTER — Inpatient Hospital Stay (HOSPITAL_COMMUNITY)
Admission: AD | Admit: 2017-02-24 | Discharge: 2017-03-02 | DRG: 981 | Disposition: A | Discharge status: Home / Self Care | Payer: BC Managed Care – PPO | Source: Ambulatory Visit | Attending: Internal Medicine | Admitting: Internal Medicine

## 2017-02-24 ENCOUNTER — Encounter (HOSPITAL_COMMUNITY): Payer: Self-pay

## 2017-02-24 ENCOUNTER — Other Ambulatory Visit (HOSPITAL_BASED_OUTPATIENT_CLINIC_OR_DEPARTMENT_OTHER): Payer: BC Managed Care – PPO | Admitting: *Deleted

## 2017-02-24 VITALS — BP 109/66 | HR 59 | Temp 97.5°F | Resp 20

## 2017-02-24 DIAGNOSIS — C7800 Secondary malignant neoplasm of unspecified lung: Secondary | ICD-10-CM | POA: Diagnosis present

## 2017-02-24 DIAGNOSIS — D630 Anemia in neoplastic disease: Secondary | ICD-10-CM | POA: Diagnosis present

## 2017-02-24 DIAGNOSIS — K769 Liver disease, unspecified: Secondary | ICD-10-CM

## 2017-02-24 DIAGNOSIS — Z515 Encounter for palliative care: Secondary | ICD-10-CM

## 2017-02-24 DIAGNOSIS — D696 Thrombocytopenia, unspecified: Secondary | ICD-10-CM

## 2017-02-24 DIAGNOSIS — I10 Essential (primary) hypertension: Secondary | ICD-10-CM | POA: Diagnosis present

## 2017-02-24 DIAGNOSIS — R627 Adult failure to thrive: Secondary | ICD-10-CM | POA: Diagnosis present

## 2017-02-24 DIAGNOSIS — J9621 Acute and chronic respiratory failure with hypoxia: Secondary | ICD-10-CM | POA: Diagnosis present

## 2017-02-24 DIAGNOSIS — Z91041 Radiographic dye allergy status: Secondary | ICD-10-CM | POA: Diagnosis not present

## 2017-02-24 DIAGNOSIS — K7200 Acute and subacute hepatic failure without coma: Secondary | ICD-10-CM | POA: Diagnosis not present

## 2017-02-24 DIAGNOSIS — Z880 Allergy status to penicillin: Secondary | ICD-10-CM

## 2017-02-24 DIAGNOSIS — C50919 Malignant neoplasm of unspecified site of unspecified female breast: Secondary | ICD-10-CM

## 2017-02-24 DIAGNOSIS — Z853 Personal history of malignant neoplasm of breast: Secondary | ICD-10-CM | POA: Diagnosis not present

## 2017-02-24 DIAGNOSIS — E43 Unspecified severe protein-calorie malnutrition: Secondary | ICD-10-CM | POA: Diagnosis present

## 2017-02-24 DIAGNOSIS — R531 Weakness: Secondary | ICD-10-CM

## 2017-02-24 DIAGNOSIS — Z170 Estrogen receptor positive status [ER+]: Secondary | ICD-10-CM | POA: Diagnosis not present

## 2017-02-24 DIAGNOSIS — Z7189 Other specified counseling: Secondary | ICD-10-CM | POA: Diagnosis not present

## 2017-02-24 DIAGNOSIS — D6959 Other secondary thrombocytopenia: Secondary | ICD-10-CM | POA: Diagnosis present

## 2017-02-24 DIAGNOSIS — D649 Anemia, unspecified: Secondary | ICD-10-CM

## 2017-02-24 DIAGNOSIS — Z800 Family history of malignant neoplasm of digestive organs: Secondary | ICD-10-CM | POA: Diagnosis not present

## 2017-02-24 DIAGNOSIS — E559 Vitamin D deficiency, unspecified: Secondary | ICD-10-CM

## 2017-02-24 DIAGNOSIS — Z0189 Encounter for other specified special examinations: Secondary | ICD-10-CM

## 2017-02-24 DIAGNOSIS — C787 Secondary malignant neoplasm of liver and intrahepatic bile duct: Secondary | ICD-10-CM

## 2017-02-24 DIAGNOSIS — C50411 Malignant neoplasm of upper-outer quadrant of right female breast: Secondary | ICD-10-CM

## 2017-02-24 DIAGNOSIS — Z9012 Acquired absence of left breast and nipple: Secondary | ICD-10-CM

## 2017-02-24 DIAGNOSIS — D72829 Elevated white blood cell count, unspecified: Secondary | ICD-10-CM | POA: Diagnosis not present

## 2017-02-24 DIAGNOSIS — C7951 Secondary malignant neoplasm of bone: Secondary | ICD-10-CM | POA: Diagnosis present

## 2017-02-24 DIAGNOSIS — J189 Pneumonia, unspecified organism: Secondary | ICD-10-CM | POA: Diagnosis present

## 2017-02-24 DIAGNOSIS — Z6841 Body Mass Index (BMI) 40.0 and over, adult: Secondary | ICD-10-CM | POA: Diagnosis not present

## 2017-02-24 DIAGNOSIS — C50911 Malignant neoplasm of unspecified site of right female breast: Secondary | ICD-10-CM

## 2017-02-24 DIAGNOSIS — R630 Anorexia: Secondary | ICD-10-CM | POA: Diagnosis not present

## 2017-02-24 DIAGNOSIS — E869 Volume depletion, unspecified: Secondary | ICD-10-CM | POA: Diagnosis present

## 2017-02-24 DIAGNOSIS — Z5111 Encounter for antineoplastic chemotherapy: Secondary | ICD-10-CM

## 2017-02-24 LAB — COMPREHENSIVE METABOLIC PANEL
ALT: 59 U/L — AB (ref 0–55)
ANION GAP: 15 meq/L — AB (ref 3–11)
AST: 107 U/L — AB (ref 5–34)
Albumin: 2.3 g/dL — ABNORMAL LOW (ref 3.5–5.0)
Alkaline Phosphatase: 467 U/L — ABNORMAL HIGH (ref 40–150)
BILIRUBIN TOTAL: 11.98 mg/dL — AB (ref 0.20–1.20)
BUN: 21.4 mg/dL (ref 7.0–26.0)
CHLORIDE: 105 meq/L (ref 98–109)
CO2: 18 meq/L — AB (ref 22–29)
CREATININE: 1.2 mg/dL — AB (ref 0.6–1.1)
Calcium: 8.7 mg/dL (ref 8.4–10.4)
EGFR: 60 mL/min/{1.73_m2} — ABNORMAL LOW (ref >60)
Glucose: 116 mg/dl (ref 70–140)
Potassium: 4.9 mEq/L (ref 3.5–5.1)
Sodium: 138 mEq/L (ref 136–145)
TOTAL PROTEIN: 7.1 g/dL (ref 6.4–8.3)

## 2017-02-24 LAB — CBC WITH DIFFERENTIAL/PLATELET
BASO%: 4.2 % — AB (ref 0.0–2.0)
BASOS ABS: 0.5 10*3/uL — AB (ref 0.0–0.1)
EOS%: 3.7 % (ref 0.0–7.0)
Eosinophils Absolute: 0.4 10*3/uL (ref 0.0–0.5)
HCT: 31 % — ABNORMAL LOW (ref 34.8–46.6)
HEMOGLOBIN: 9.3 g/dL — AB (ref 11.6–15.9)
LYMPH#: 2.1 10*3/uL (ref 0.9–3.3)
LYMPH%: 19.4 % (ref 14.0–49.7)
MCH: 32 pg (ref 25.1–34.0)
MCHC: 30 g/dL — ABNORMAL LOW (ref 31.5–36.0)
MCV: 106.5 fL — AB (ref 79.5–101.0)
MONO#: 1.3 10*3/uL — ABNORMAL HIGH (ref 0.1–0.9)
MONO%: 11.6 % (ref 0.0–14.0)
NEUT#: 6.6 10*3/uL — ABNORMAL HIGH (ref 1.5–6.5)
NEUT%: 61.1 % (ref 38.4–76.8)
NRBC: 23 % — AB (ref 0–0)
Platelets: 14 10*3/uL — ABNORMAL LOW (ref 145–400)
RBC: 2.91 10*6/uL — AB (ref 3.70–5.45)
RDW: 21.8 % — AB (ref 11.2–14.5)
WBC: 10.8 10*3/uL — ABNORMAL HIGH (ref 3.9–10.3)

## 2017-02-24 LAB — PROTIME-INR
INR: 2.2 (ref 2.00–3.50)
PROTIME: 26.4 s — AB (ref 10.6–13.4)

## 2017-02-24 LAB — MAGNESIUM: Magnesium: 2 mg/dL (ref 1.7–2.4)

## 2017-02-24 LAB — TECHNOLOGIST REVIEW

## 2017-02-24 LAB — TSH: TSH: 2.116 u[IU]/mL (ref 0.350–4.500)

## 2017-02-24 LAB — PHOSPHORUS: Phosphorus: 2.6 mg/dL (ref 2.5–4.6)

## 2017-02-24 LAB — CHCC SMEAR

## 2017-02-24 MED ORDER — AMLODIPINE BESYLATE 10 MG PO TABS
10.0000 mg | ORAL_TABLET | Freq: Every day | ORAL | Status: DC
  Administered 2017-02-28 – 2017-03-02 (×3): 10 mg via ORAL
  Filled 2017-02-24 (×6): qty 1

## 2017-02-24 MED ORDER — AMLODIPINE-OLMESARTAN 10-40 MG PO TABS: 1.0000 | ORAL_TABLET | Freq: Every day | ORAL | Status: DC

## 2017-02-24 MED ORDER — SODIUM CHLORIDE 0.9 % IV SOLN
INTRAVENOUS | Status: DC
  Administered 2017-02-24 – 2017-03-01 (×4): via INTRAVENOUS

## 2017-02-24 MED ORDER — OXYCODONE HCL 5 MG PO TABS
5.0000 mg | ORAL_TABLET | ORAL | Status: DC | PRN
  Administered 2017-02-24 – 2017-03-02 (×5): 5 mg via ORAL
  Filled 2017-02-24 (×5): qty 1

## 2017-02-24 MED ORDER — SODIUM CHLORIDE 0.9 % IV SOLN
Freq: Once | INTRAVENOUS | Status: AC
  Administered 2017-02-24: 13:00:00 via INTRAVENOUS

## 2017-02-24 MED ORDER — LORATADINE 10 MG PO TABS
10.0000 mg | ORAL_TABLET | Freq: Every day | ORAL | Status: DC
  Administered 2017-02-26 – 2017-03-02 (×5): 10 mg via ORAL
  Filled 2017-02-24 (×5): qty 1

## 2017-02-24 MED ORDER — IRBESARTAN 300 MG PO TABS
300.0000 mg | ORAL_TABLET | Freq: Every day | ORAL | Status: DC
  Administered 2017-02-28 – 2017-03-02 (×3): 300 mg via ORAL
  Filled 2017-02-24 (×6): qty 1

## 2017-02-24 MED ORDER — LEVOCETIRIZINE DIHYDROCHLORIDE 5 MG PO TABS: 5.0000 mg | ORAL_TABLET | Freq: Every evening | ORAL | Status: DC

## 2017-02-24 MED ORDER — ONDANSETRON HCL 4 MG/2ML IJ SOLN
4.0000 mg | Freq: Four times a day (QID) | INTRAMUSCULAR | Status: DC | PRN
  Administered 2017-02-24 – 2017-02-25 (×2): 4 mg via INTRAVENOUS
  Filled 2017-02-24 (×3): qty 2

## 2017-02-24 MED ORDER — SODIUM CHLORIDE 0.9 % IV SOLN
INTRAVENOUS | Status: DC
  Administered 2017-02-24: 15:00:00 via INTRAVENOUS

## 2017-02-24 NOTE — Progress Notes (Signed)
Visit  Danielle Oliver  [phone] [phone]   ID: Danielle Oliver DOB: November 09, 1960  MR#: [id number]  LKT#:[id number]  Patient Care Team: Danielle Rima, MD as PCP - General (Family Medicine) Oliver, Danielle Dad, MD as Consulting Physician (Oncology) Danielle Overall, MD as Consulting Physician (General Surgery) Danielle Sartorius, MD as Consulting Physician (Otolaryngology) Danielle Oliver (Inactive) GYN: OTHER MD: Danielle Heck MD  CHIEF COMPLAINT: Stage IV breast cancer  CURRENT TREATMENT: Consider paclitaxel   BREAST CANCER HISTORY: From Dr Danielle Oliver's 03/11/2014 intake note:  "She was diagnosed with stage III left breast cancer in 1991 when she was 56 years old. Unfortunately her breast cancer diagnosis and treatment history was not available for review today. Apparently she had left mastectomy, and 4 cycles of adjuvant chemotherapy under Dr. Mariana Oliver care at our cancer center. Patient does not recall the name of the chemotherapy medication. She was also offered a clinical trial of bone marrow transplant for her breast cancer and she declined. She was followed by January mammograms and had no evidence of recurrence. Her last screening mammogram was in 2009 which was benign.  She noticed a lump in her right breast about 20 weeks ago. It was hard and the tender on palpitation. She denies any skin change or nipple discharge. She has no appetite change, weight loss, or other new symptoms. She had underwent a diagnostic mammogram on 01/12/2014, which showed a 3.6 cm lobulated solid mass in right upper quadrant of right breast, and an oval axillary lymph nodes was cortical thickening. 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 Her right axillary lymph node biopsy also showed invasive adenocarcinoma."  Her subsequent history is as detailed below  INTERVAL  HISTORY: Danielle Oliver returns today for evaluation of her metastatic breast cancer.  She last saw Dr. Jana Oliver about a week ago to discuss her rapidly progressing metastatic breast cancer in her liver.  She is supposed to have liver biopsy and port placement later this week and needs to have her counts checked to arrange pre transfusions if necessary.    REVIEW OF SYSTEMS: Danielle Oliver is feeling much worse today.  She has no appetite and isn't really taking in many foods or fluids.  She is weak.  She says she is having difficulty with moving, and getting up.  She had a hard time getting up from the toilet today and it reportedly took her 2 hours to get down the steps in her house.  She is fatigued and is sleeping often as well.  Her mom is with her today who is very worried about her.  Her mom thinks she needs to be admitted because of her weakness and declining functional status.    PAST MEDICAL HISTORY: Past Medical History:  Diagnosis Date  . Arthritis   . Breast cancer ([place])   . Dysrhythmia   . Gout   . Heart murmur    one Dr told her, "nothing to worry about"  . Hypertension   . Morbid obesity with body mass index of 50.0-59.9 in adult [facility])   . Pneumonia 2013 ish  . Shortness of breath dyspnea    With exertion    PAST SURGICAL HISTORY: Past Surgical History:  Procedure Laterality Date  . BREAST LUMPECTOMY WITH RADIOACTIVE SEED LOCALIZATION Right 02/17/2015   Procedure: RIGHT BREAST LUMPECTOMY WITH RADIOACTIVE SEED LOCALIZATION;  Surgeon: Danielle Overall, MD;  Location: [facility]  OR;  Service: General;  Laterality: Right;  . COLONOSCOPY    . FRACTURE SURGERY Right   . MASTECTOMY Left 1992  . TONSILLECTOMY      FAMILY HISTORY Family History  Problem Relation Age of Onset  . Colon cancer Father    [name] died at 32 from colon cancer which was diagnosed shortly before his death. The patient's mother is living, age 77 as of July 2016. The patient is an only child. The only other  cancer in the family to her knowledge is a paternal great aunt who developed breast cancer in her late 26s. There is no history of ovarian cancer and no other history of colon cancer in the family to her knowledge  GYNECOLOGIC HISTORY:  No LMP recorded. Patient is postmenopausal. menarche age 53 first live birth age 25 the patient is Danielle Oliver P3. She stopped having periods in 2013. She did not take hormone replacement.   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 She is divorced. At home is just she and her son Danielle Oliver, 52 years old. Son, Danielle Oliver, 27, lives in [name] where he works as a Fish farm manager. Son, Danielle Oliver, 25, lives in [place] and is working on a Oceanographer in counseling at [employer]. The patient has one granddaughter, Danielle Oliver, in [name]. She is not a church attender    ADVANCED DIRECTIVES: Not in place   HEALTH MAINTENANCE: Social History   Tobacco Use  . Smoking status: Never Smoker  Substance Use Topics  . Alcohol use: No    Comment: rare  . Drug use: No     Colonoscopy: remote/ Danielle Oliver  PAP: 2014  Bone density:  Lipid panel:  Allergies  Allergen Reactions  . Iodinated Diagnostic Agents Anaphylaxis  . Penicillins Rash    Has patient had a PCN reaction causing immediate rash, facial/tongue/throat swelling, SOB or lightheadedness with hypotension: Yes  Has patient had a PCN reaction causing severe rash involving mucus membranes or skin necrosis: No Has patient had a PCN reaction that required hospitalization No Has patient had a PCN reaction occurring within the last 10 years: Yes If all of the above answers are "NO", then may proceed with Cephalosporin use.    Current Outpatient Medications  Medication Sig Dispense Refill  . amLODipine-olmesartan (AZOR) 10-40 MG tablet Take 1 tablet by mouth daily. 90 tablet 4  . Calcium Carb-Cholecalciferol (CALTRATE 600+D) 600-800 MG-UNIT TABS Take by mouth 2 (two) times daily. 60 tablet   .  Denosumab (XGEVA [place]) Inject into the skin.    Marland Kitchen levocetirizine (XYZAL) 5 MG tablet Take 5 mg by mouth every evening.    . naproxen sodium (ALEVE) 220 MG tablet Take 220 mg by mouth as needed.    . predniSONE (DELTASONE) 50 MG tablet Take 1 tablet at 13 hours, 7 hours and 1 hour before procedure. 3 tablet 0   Current Facility-Administered Medications  Medication Dose Route Frequency Provider Last Rate Last Dose  . 0.9 %  sodium chloride infusion   Intravenous Continuous Naomia Lenderman, Charlestine Massed, NP       Facility-Administered Medications Ordered in Other Visits  Medication Dose Route Frequency Provider Last Rate Last Dose  . 0.9 %  sodium chloride infusion   Intravenous Once Gardenia Phlegm, NP        OBJECTIVE:  Vitals:   02/24/17 1121  BP: 109/66  Pulse: (!) 59  Resp: 20  Temp: (!) 97.5 F (36.4 C)  SpO2: 100%  There is no height or weight on file to calculate BMI.    ECOG FS:2 - Symptomatic, <50% confined to bed GENERAL: Patient is a chronically ill obese woman in wheelchair, she appears tired HEENT:  Sclerae icteric.  Oropharynx clear and moist. No ulcerations or evidence of oropharyngeal candidiasis. Neck is supple.  NODES:  No cervical, supraclavicular, or axillary lymphadenopathy palpated.  BREAST EXAM:  Deferred. LUNGS:  Clear to auscultation bilaterally.  No wheezes or rhonchi. HEART:  Regular rate and rhythm. No murmur appreciated. ABDOMEN:  Soft, nontender.  Positive, normoactive bowel sounds. No organomegaly palpated. Difficult exam due to inability to get on exam table and body habitus.   SKIN:  Clear with no obvious rashes or skin changes. No nail dyscrasia. NEURO:  Nonfocal. Well oriented.  Appropriate affect.    LAB RESULTS:  CMP     Component Value Date/Time   NA 139 02/14/2017 1515   K 3.6 02/14/2017 1515   CL 110 02/16/2015 1152   CO2 19 (L) 02/14/2017 1515   GLUCOSE 134 02/14/2017 1515   BUN 14.7 02/14/2017 1515   CREATININE 1.1  02/14/2017 1515   CALCIUM 8.7 02/14/2017 1515   PROT 7.1 02/14/2017 1515   ALBUMIN 2.4 (L) 02/14/2017 1515   AST 94 (H) 02/14/2017 1515   ALT 53 02/14/2017 1515   ALKPHOS 337 (H) 02/14/2017 1515   BILITOT 5.29 (HH) 02/14/2017 1515   GFRNONAA >60 02/16/2015 1152   GFRAA >60 02/16/2015 1152     INo results found for: SPEP, UPEP  Lab Results  Component Value Date   WBC 10.8 (H) 02/24/2017   NEUTROABS 6.6 (H) 02/24/2017   HGB 9.3 (L) 02/24/2017   HCT 31.0 (L) 02/24/2017   MCV 106.5 (H) 02/24/2017   PLT 14 (L) 02/24/2017      Chemistry      Component Value Date/Time   NA 139 02/14/2017 1515   K 3.6 02/14/2017 1515   CL 110 02/16/2015 1152   CO2 19 (L) 02/14/2017 1515   BUN 14.7 02/14/2017 1515   CREATININE 1.1 02/14/2017 1515      Component Value Date/Time   CALCIUM 8.7 02/14/2017 1515   ALKPHOS 337 (H) 02/14/2017 1515   AST 94 (H) 02/14/2017 1515   ALT 53 02/14/2017 1515   BILITOT 5.29 (HH) 02/14/2017 1515       Lab Results  Component Value Date   LABCA2 85 (H) 05/22/2015    No components found for: LABCA125  Recent Labs  Lab 02/24/17 1049  INR 2.20    Urinalysis    Component Value Date/Time   LABSPEC 1.015 04/27/2007 1654   PHURINE 6.0 04/27/2007 1654   GLUCOSEU NEGATIVE 04/27/2007 1654   HGBUR NEGATIVE 04/27/2007 1654   BILIRUBINUR NEGATIVE 04/27/2007 1654   KETONESUR NEGATIVE 04/27/2007 1654   PROTEINUR NEGATIVE 04/27/2007 1654   UROBILINOGEN 0.2 04/27/2007 1654   NITRITE NEGATIVE 04/27/2007 1654   LEUKOCYTESUR  04/27/2007 1654    NEGATIVE Biochemical Testing Only. Please order routine urinalysis from main lab if confirmatory testing is needed.   STUDIES: US Abdomen Complete  Result Date: 02/17/2017 CLINICAL DATA:  Right upper quadrant abdominal pain and nausea for 3 days. History of breast cancer. EXAM: ABDOMEN ULTRASOUND COMPLETE COMPARISON:  03/18/2014 CT abdomen/ pelvis. FINDINGS: Gallbladder: Cholelithiasis, with shadowing calcified  gallstones measuring up to 2.7 cm. Mild diffuse gallbladder wall thickening. No pericholecystic fluid. Sonographic Percell Miller sign is present. Common bile duct: Diameter: 3 mm Liver: Hepatomegaly. Liver parenchyma is markedly heterogeneous, with  the suggestion of innumerable small hypoechoic liver masses throughout the liver, largest 1.8 x 1.6 x 1.6 cm in the left liver lobe and 2.1 x 1.5 x 1.7 cm in the posterior right liver lobe. Portal vein is patent on color Doppler imaging with normal direction of blood flow towards the liver. IVC: No abnormality visualized. Pancreas: Visualized portion unremarkable. Spleen: Size and appearance within normal limits. Right Kidney: Length: 10.0 cm. Echogenicity within normal limits. No mass or hydronephrosis visualized. Left Kidney: Length: 10.4 cm. Echogenicity within normal limits. No mass or hydronephrosis visualized. Abdominal aorta: No aneurysm visualized. Other findings: None. IMPRESSION: 1. Cholelithiasis. Mild diffuse gallbladder wall thickening. Sonographic Percell Miller sign is present. Findings are compatible with acute calculous cholecystitis in the correct clinical setting. 2. No biliary ductal dilatation. 3. Hepatomegaly with markedly heterogeneous liver parenchyma, with the suggestion of innumerable small hypoechoic liver masses throughout the liver, worrisome for hepatic metastases given the history of breast cancer. Recommend further evaluation with either staging CT of the chest, abdomen and pelvis with IV and oral contrast, MRI abdomen without and with IV contrast or PET-CT, as clinically warranted. These results will be called to the ordering clinician or representative by the Radiologist Assistant, and communication documented in the PACS or zVision Dashboard. Electronically Signed   By: Ilona Sorrel M.D.   On: 02/17/2017 17:30   ASSESSMENT: 56 y.o. [place] woman  (1) status post left mastectomy in 1991 followed by adjuvant chemotherapy Consisting of doxorubicin  and cyclophosphamide given every 21 days 4 (records under "media")  (2) status post right breast upper outer quadrant biopsy 01/13/2014 for a  cT2 p N1, stage IIB invasive ductal carcinoma, estrogen and progesterone receptor positive, HER-2 negative, with an MIB-1 of 10%.  METASTATIC DISEASE: CT scan 03/18/2014 shows multiple sclerotic bone mets but no visceral disease at that time  (1) no bone biopsy obtained to this point  (b) CA 27-29 is informative  (3) anastrozole started January 2016, discontinued September 2018 because of persistent low counts with palbociclib  (a)  palbociclib added 11/21/2014 at 125 mg daily, 21/7  (b) Palbociclib dose decreased to 100 mg daily, 21/ 7, with cycle 2, starting 12/21/2014  (c) palbociclib held after 11/07/2016 because of continuing low counts  (d) anastrozole discontinued December 2018 with evidence of disease progression  (4) vitamin D deficiency: On replacement  (5) status post right lumpectomy 02/17/2015 for a pT2 pNX invasive ductal carcinoma, grade 1, estrogen receptor 100% positive, progesterone receptor 20% positive, HER-2 not amplified. Margins were negative  (a) radiation therapy to lumpectomy site declined by patient  (6) denosumab/ Xgeva started 12/15/2014, repeated monthly  (7) genetics testing discussed--patient demurs  (8) started fulvestrant 12/20/2016, the last dose 02/14/2017, discontinued with disease progression  (9) RESTAGING December 2018  (a) abdominal ultrasound 02/17/2017 shows multiple liver lesions  (b) CT scans of the brain, chest, abdomen and pelvis pending  (c) liver biopsy pending   PLAN: I am concerned about Danielle Oliver.  She is increasingly weakened and is not eating or drinking well.  We will give her fluids today, while we wait on her labs to come back, however, I am concerned she will need to be admitted to the hospital for failure to thrive.  There she can have the liver biopsy, port placement arranged with the  appropriate transfusions, and they can work on helping her strength, or helping her with accommodations/transfer her to a facility that is safe for her.    After receiving a liter of  IV fluids, she is feeling improved, however remains weak and concerned about going home due to her weakness and immobility.  I also reviewed her labs with her.  Her bilirubin has increased to almost 12.  Her plts are 14.  She will need to have her counts closely followed and transfused when indicated.  I have paged the hospitalists for a direct admission to the hospital.    A total of (30) minutes of face-to-face time was spent with this patient with greater than 50% of that time in counseling and care-coordination.   Wilber Bihari, NP  02/24/17 12:41 PM Medical Oncology and Hematology [facility] [address] [place], [place] [address] Tel. [phone]    Fax. [phone]

## 2017-02-24 NOTE — Patient Instructions (Signed)
Dehydration, Adult Dehydration is a condition in which there is not enough fluid or water in the body. This happens when you lose more fluids than you take in. Important organs, such as the kidneys, brain, and heart, cannot function without a proper amount of fluids. Any loss of fluids from the body can lead to dehydration. Dehydration can range from mild to severe. This condition should be treated right away to prevent it from becoming severe. What are the causes? This condition may be caused by:  Vomiting.  Diarrhea.  Excessive sweating, such as from heat exposure or exercise.  Not drinking enough fluid, especially: ? When ill. ? While doing activity that requires a lot of energy.  Excessive urination.  Fever.  Infection.  Certain medicines, such as medicines that cause the body to lose excess fluid (diuretics).  Inability to access safe drinking water.  Reduced physical ability to get adequate water and food.  What increases the risk? This condition is more likely to develop in people:  Who have a poorly controlled long-term (chronic) illness, such as diabetes, heart disease, or kidney disease.  Who are age 65 or older.  Who are disabled.  Who live in a place with high altitude.  Who play endurance sports.  What are the signs or symptoms? Symptoms of mild dehydration may include:  Thirst.  Dry lips.  Slightly dry mouth.  Dry, warm skin.  Dizziness. Symptoms of moderate dehydration may include:  Very dry mouth.  Muscle cramps.  Dark urine. Urine may be the color of tea.  Decreased urine production.  Decreased tear production.  Heartbeat that is irregular or faster than normal (palpitations).  Headache.  Light-headedness, especially when you stand up from a sitting position.  Fainting (syncope). Symptoms of severe dehydration may include:  Changes in skin, such as: ? Cold and clammy skin. ? Blotchy (mottled) or pale skin. ? Skin that does  not quickly return to normal after being lightly pinched and released (poor skin turgor).  Changes in body fluids, such as: ? Extreme thirst. ? No tear production. ? Inability to sweat when body temperature is high, such as in hot weather. ? Very little urine production.  Changes in vital signs, such as: ? Weak pulse. ? Pulse that is more than 100 beats a minute when sitting still. ? Rapid breathing. ? Low blood pressure.  Other changes, such as: ? Sunken eyes. ? Cold hands and feet. ? Confusion. ? Lack of energy (lethargy). ? Difficulty waking up from sleep. ? Short-term weight loss. ? Unconsciousness. How is this diagnosed? This condition is diagnosed based on your symptoms and a physical exam. Blood and urine tests may be done to help confirm the diagnosis. How is this treated? Treatment for this condition depends on the severity. Mild or moderate dehydration can often be treated at home. Treatment should be started right away. Do not wait until dehydration becomes severe. Severe dehydration is an emergency and it needs to be treated in a hospital. Treatment for mild dehydration may include:  Drinking more fluids.  Replacing salts and minerals in your blood (electrolytes) that you may have lost. Treatment for moderate dehydration may include:  Drinking an oral rehydration solution (ORS). This is a drink that helps you replace fluids and electrolytes (rehydrate). It can be found at pharmacies and retail stores. Treatment for severe dehydration may include:  Receiving fluids through an IV tube.  Receiving an electrolyte solution through a feeding tube that is passed through your nose   and into your stomach (nasogastric tube, or NG tube).  Correcting any abnormalities in electrolytes.  Treating the underlying cause of dehydration. Follow these instructions at home:  If directed by your health care provider, drink an ORS: ? Make an ORS by following instructions on the  package. ? Start by drinking small amounts, about  cup (120 mL) every 5-10 minutes. ? Slowly increase how much you drink until you have taken the amount recommended by your health care provider.  Drink enough clear fluid to keep your urine clear or pale yellow. If you were told to drink an ORS, finish the ORS first, then start slowly drinking other clear fluids. Drink fluids such as: ? Water. Do not drink only water. Doing that can lead to having too little salt (sodium) in the body (hyponatremia). ? Ice chips. ? Fruit juice that you have added water to (diluted fruit juice). ? Low-calorie sports drinks.  Avoid: ? Alcohol. ? Drinks that contain a lot of sugar. These include high-calorie sports drinks, fruit juice that is not diluted, and soda. ? Caffeine. ? Foods that are greasy or contain a lot of fat or sugar.  Take over-the-counter and prescription medicines only as told by your health care provider.  Do not take sodium tablets. This can lead to having too much sodium in the body (hypernatremia).  Eat foods that contain a healthy balance of electrolytes, such as bananas, oranges, potatoes, tomatoes, and spinach.  Keep all follow-up visits as told by your health care provider. This is important. Contact a health care provider if:  You have abdominal pain that: ? Gets worse. ? Stays in one area (localizes).  You have a rash.  You have a stiff neck.  You are more irritable than usual.  You are sleepier or more difficult to wake up than usual.  You feel weak or dizzy.  You feel very thirsty.  You have urinated only a small amount of very dark urine over 6-8 hours. Get help right away if:  You have symptoms of severe dehydration.  You cannot drink fluids without vomiting.  Your symptoms get worse with treatment.  You have a fever.  You have a severe headache.  You have vomiting or diarrhea that: ? Gets worse. ? Does not go away.  You have blood or green matter  (bile) in your vomit.  You have blood in your stool. This may cause stool to look black and tarry.  You have not urinated in 6-8 hours.  You faint.  Your heart rate while sitting still is over 100 beats a minute.  You have trouble breathing. This information is not intended to replace advice given to you by your health care provider. Make sure you discuss any questions you have with your health care provider. Document Released: 02/18/2005 Document Revised: 09/15/2015 Document Reviewed: 04/14/2015 Elsevier Interactive Patient Education  2018 [employer]  

## 2017-02-24 NOTE — H&P (Signed)
History and Physical  [place]le [place] QJF:[id number] DOB: [birth year] DOA: 02/24/2017  Referring physician: Lurline Del, M.D. (Oncologist) PCP: Helane Rima, MD  Outpatient Specialists: Lurline Del, M.D. (Oncologist)   Patient coming from: Home via Oncologist office  Chief Complaint: Weakness and fatigue  HPI: Patient is a 56 year old African American female, morbidly obese, with history of metastatic breast cancer, with mets to liver, bone and lung. Patient was seen at the [place] office earlier today feeling weak, fatigue on minimal exertion, poor po intake with about 30 pounds weight loss in the last one month. No headache, no neck pain, no fever or chills, no chest pain, no SOB, no urinary symptoms. Patient has nausea, but denies vomiting. Patient is volume deplete. Work up done so far revealed Tbil of 11.9, MCV of 106.5, INR of 2.3, Alb of 2.3, alk phos of 467, AST of 107 and ALT of 59. Severe thrombocytopenia with platelet of 14. Patient has been admitted for further assessment and management.  ED Course: Admitted via Oncologist's office  Pertinent labs: See above EKG:  Imaging: independently reviewed.   Review of Systems:  As in HPI. 10 systems were reviewed.  Past Medical History:  Diagnosis Date  . Arthritis   . Breast cancer ([place])   . Dysrhythmia   . Gout   . Heart murmur    one Dr told her, "nothing to worry about"  . Hypertension   . Morbid obesity with body mass index of 50.0-59.9 in adult [facility])   . Pneumonia 2013 ish  . Shortness of breath dyspnea    With exertion    Past Surgical History:  Procedure Laterality Date  . BREAST LUMPECTOMY WITH RADIOACTIVE SEED LOCALIZATION Right 02/17/2015   Procedure: RIGHT BREAST LUMPECTOMY WITH RADIOACTIVE SEED LOCALIZATION;  Surgeon: Alphonsa Overall, MD;  Location: [place];  Service: General;  Laterality: Right;  . COLONOSCOPY    . FRACTURE SURGERY Right   . MASTECTOMY Left 1992  . TONSILLECTOMY       reports that   has never smoked. she has never used smokeless tobacco. She reports that she does not drink alcohol or use drugs.  Allergies  Allergen Reactions  . Iodinated Diagnostic Agents Anaphylaxis  . Penicillins Rash    Has patient had a PCN reaction causing immediate rash, facial/tongue/throat swelling, SOB or lightheadedness with hypotension: Yes  Has patient had a PCN reaction causing severe rash involving mucus membranes or skin necrosis: No Has patient had a PCN reaction that required hospitalization No Has patient had a PCN reaction occurring within the last 10 years: Yes If all of the above answers are "NO", then may proceed with Cephalosporin use.    Family History  Problem Relation Age of Onset  . Colon cancer Father      Prior to Admission medications   Medication Sig Start Date End Date Taking? Authorizing Provider  amLODipine-olmesartan (AZOR) 10-40 MG tablet Take 1 tablet by mouth daily. 05/24/16  Yes [name], [name], MD  Calcium Carb-Cholecalciferol (CALTRATE 600+D) 600-800 MG-UNIT TABS Take by mouth 2 (two) times daily. 01/18/15  Yes [name], [name], MD  Denosumab (XGEVA [place]) Inject into the skin.   Yes [provider]  levocetirizine (XYZAL) 5 MG tablet Take 5 mg by mouth every evening.   Yes [provider]  naproxen sodium (ALEVE) 220 MG tablet Take 220 mg by mouth as needed.   Yes [provider]  predniSONE (DELTASONE) 50 MG tablet Take 1 tablet at 13 hours,  7 hours and 1 hour before procedure. 02/19/17  Yes [name], [name], MD    Physical Exam: Vitals:   02/24/17 1609 02/24/17 1651  BP: 115/68 114/63  Pulse: 88 88  Resp: 18 18  Temp: 97.8 F (36.6 C) 97.8 F (36.6 C)  TempSrc: Oral Oral  SpO2: 100%   Weight: (!) 150.7 kg (332 lb 3.7 oz) (!) 150.7 kg (332 lb 3.7 oz)  Height: _0  (1.702 m) _1  (1.702 m)     Constitutional:  . Appears calm and comfortable. Morbidly obese Eyes:  . Pallor.  ENMT:  . external ears, nose  appear normal Neck:  . Neck is supple. No JVD Respiratory:  . CTA bilaterally, no w/r/r.  . Respiratory effort normal. No retractions or accessory muscle use Cardiovascular:  . S1S2 . No LE extremity edema   Abdomen:  . Abdomen is soft and non tender. Organs are difficult to assess. Abdomen is morbidly obese. Neurologic:  . Awake and alert. . Moves all limbs.  Wt Readings from Last 3 Encounters:  02/24/17 (!) 150.7 kg (332 lb 3.7 oz)  02/19/17 (!) 150.5 kg (331 lb 11.2 oz)  01/03/17 (!) 157.4 kg (347 lb)    I have personally reviewed following labs and imaging studies  Labs on Admission:  CBC: Recent Labs  Lab 02/24/17 1049  WBC 10.8*  NEUTROABS 6.6*  HGB 9.3*  HCT 31.0*  MCV 106.5*  PLT 14*   Basic Metabolic Panel: Recent Labs  Lab 02/24/17 1159  NA 138  K 4.9  CO2 18*  GLUCOSE 116  BUN 21.4  CREATININE 1.2*  CALCIUM 8.7   Liver Function Tests: Recent Labs  Lab 02/24/17 1159  AST 107*  ALT 59*  ALKPHOS 467*  BILITOT 11.98*  PROT 7.1  ALBUMIN 2.3*   No results for input(s): LIPASE, AMYLASE in the last 168 hours. No results for input(s): AMMONIA in the last 168 hours. Coagulation Profile: Recent Labs  Lab 02/24/17 1049  INR 2.20  PROTIME 26.4*   Cardiac Enzymes: No results for input(s): CKTOTAL, CKMB, CKMBINDEX, TROPONINI in the last 168 hours. BNP (last 3 results) No results for input(s): PROBNP in the last 8760 hours. HbA1C: No results for input(s): HGBA1C in the last 72 hours. CBG: No results for input(s): GLUCAP in the last 168 hours. Lipid Profile: No results for input(s): CHOL, HDL, LDLCALC, TRIG, CHOLHDL, LDLDIRECT in the last 72 hours. Thyroid Function Tests: No results for input(s): TSH, T4TOTAL, FREET4, T3FREE, THYROIDAB in the last 72 hours. Anemia Panel: No results for input(s): VITAMINB12, FOLATE, FERRITIN, TIBC, IRON, RETICCTPCT in the last 72 hours. Urine analysis:    Component Value Date/Time   LABSPEC 1.015 04/27/2007  1654   PHURINE 6.0 04/27/2007 1654   GLUCOSEU NEGATIVE 04/27/2007 1654   HGBUR NEGATIVE 04/27/2007 1654   BILIRUBINUR NEGATIVE 04/27/2007 1654   KETONESUR NEGATIVE 04/27/2007 1654   PROTEINUR NEGATIVE 04/27/2007 1654   UROBILINOGEN 0.2 04/27/2007 1654   NITRITE NEGATIVE 04/27/2007 1654   LEUKOCYTESUR  04/27/2007 1654    NEGATIVE Biochemical Testing Only. Please order routine urinalysis from main lab if confirmatory testing is needed.   Sepsis Labs: _2 (procalcitonin:4,lacticidven:4) ) Recent Results (from the past 240 hour(s))  TECHNOLOGIST REVIEW     Status: None   Collection Time: 02/24/17 10:49 AM  Result Value Ref Range Status   Technologist Review Metas and Myelocytes,2% blasts, 26% nrbcs present  Final      Radiological Exams on Admission: No results found.  EKG:  Independently reviewed.   Active Problems:   Metastatic breast cancer (HCC)   Assessment/Plan 1. Metastatic breast cancer with mets to liver, bone and lung 2. Failure to thrive 3. Volume depletion 4. Severe Thrombocytopenia 5. Possible liver failure 6. Weakness and fatigue 7. Morbid obesity 8. Hypertension   Admit patient  Hydrate patient  Low threshold to transfuse patient with platelet  PT/OT consult  Calorie count  Optimize BP  Oncology consult  Low threshold to consult palliative care.  Further patient will depend on hospital course.  DVT prophylaxis:SCD Code Status: Full Family Communication: Mom Disposition Plan: Will depend on hospital course   Consults called: Low threshold to consult Palliative care   Admission status: Observation    Time spent: 65 minutes  Dana Allan, MD  [facility] Pager #: [phone] 7PM-7AM contact night coverage as above   02/24/2017, 5:27 PM

## 2017-02-25 ENCOUNTER — Encounter (HOSPITAL_COMMUNITY): Payer: Self-pay | Admitting: Internal Medicine

## 2017-02-25 DIAGNOSIS — I10 Essential (primary) hypertension: Secondary | ICD-10-CM

## 2017-02-25 DIAGNOSIS — R531 Weakness: Secondary | ICD-10-CM

## 2017-02-25 DIAGNOSIS — E1159 Type 2 diabetes mellitus with other circulatory complications: Secondary | ICD-10-CM | POA: Insufficient documentation

## 2017-02-25 LAB — CANCER ANTIGEN 27.29

## 2017-02-25 LAB — COMPREHENSIVE METABOLIC PANEL
ALT: 54 U/L (ref 14–54)
AST: 108 U/L — ABNORMAL HIGH (ref 15–41)
Albumin: 2.1 g/dL — ABNORMAL LOW (ref 3.5–5.0)
Alkaline Phosphatase: 366 U/L — ABNORMAL HIGH (ref 38–126)
Anion gap: 7 (ref 5–15)
BUN: 19 mg/dL (ref 6–20)
CO2: 20 mmol/L — ABNORMAL LOW (ref 22–32)
Calcium: 8.2 mg/dL — ABNORMAL LOW (ref 8.9–10.3)
Chloride: 110 mmol/L (ref 101–111)
Creatinine, Ser: 0.9 mg/dL (ref 0.44–1.00)
GFR calc Af Amer: >60 mL/min (ref >60)
GFR calc non Af Amer: >60 mL/min (ref >60)
Glucose, Bld: 94 mg/dL (ref 65–99)
Potassium: 4.7 mmol/L (ref 3.5–5.1)
Sodium: 137 mmol/L (ref 135–145)
Total Bilirubin: 11.9 mg/dL — ABNORMAL HIGH (ref 0.3–1.2)
Total Protein: 6.1 g/dL — ABNORMAL LOW (ref 6.5–8.1)

## 2017-02-25 LAB — CBC
HCT: 24.7 % — ABNORMAL LOW (ref 36.0–46.0)
Hemoglobin: 7.5 g/dL — ABNORMAL LOW (ref 12.0–15.0)
MCH: 32.1 pg (ref 26.0–34.0)
MCHC: 30.4 g/dL (ref 30.0–36.0)
MCV: 105.6 fL — ABNORMAL HIGH (ref 78.0–100.0)
Platelets: 16 10*3/uL — CL (ref 150–400)
RBC: 2.34 MIL/uL — ABNORMAL LOW (ref 3.87–5.11)
RDW: 21.8 % — ABNORMAL HIGH (ref 11.5–15.5)
WBC: 11.4 10*3/uL — ABNORMAL HIGH (ref 4.0–10.5)

## 2017-02-25 LAB — HIV ANTIBODY (ROUTINE TESTING W REFLEX): HIV Screen 4th Generation wRfx: NONREACTIVE

## 2017-02-25 LAB — ABO/RH: ABO/RH(D): O POS

## 2017-02-25 MED ORDER — DIPHENHYDRAMINE HCL 25 MG PO CAPS
25.0000 mg | ORAL_CAPSULE | Freq: Three times a day (TID) | ORAL | Status: DC | PRN
Start: 2017-02-25 — End: 2017-03-02
  Administered 2017-02-25 – 2017-02-26 (×3): 25 mg via ORAL
  Filled 2017-02-25 (×3): qty 1

## 2017-02-25 MED ORDER — IBUPROFEN 200 MG PO TABS
400.0000 mg | ORAL_TABLET | Freq: Four times a day (QID) | ORAL | Status: DC | PRN
  Administered 2017-02-25 – 2017-03-02 (×3): 400 mg via ORAL
  Filled 2017-02-25 (×4): qty 2

## 2017-02-25 MED ORDER — SODIUM CHLORIDE 0.9 % IV SOLN: Freq: Once | INTRAVENOUS | Status: DC

## 2017-02-25 NOTE — Progress Notes (Signed)
Critical Value Alert  Critical Value: Platelet 16  Date & Time Notified: 02/25/2017 0710  Provider Notified: A. Adhikari  Orders Received: Provider to assess and address

## 2017-02-25 NOTE — Progress Notes (Signed)
PROGRESS NOTE    [place]le [place]  VOJ:[id number] DOB: 1961/02/20 DOA: 02/24/2017 PCP: Helane Rima, MD   Brief Narrative:  Patient is a 56 year old African-American female with past medical history of metastatic breast cancer with metastases to liver, bone and lung, morbid obesity who was sent here with a direct admission from [facility]/oncology office.  Patient reported of generalized weakness, fatigue on minimal exertion and 30 pound weight loss in the last month. Patient was found to be anemic, thrombocytopenic and with elevation of bilirubin,  INR and liver enzymes.  Assessment & Plan:   Active Problems:   Metastatic breast cancer [facility])   Metastatic breast cancer: With metastases to liver, bone . Will talk to oncology tomorrow.  Palliative consult also placed. Follows with Dr. Jana Hakim. She was diagnosed with a stage III left breast cancer in 29 when she was 56 years old.  Apparently she had a left mastectomy and 4 cycles of adjuvant chemotherapy at that time. She underwent diagnostic mammogram on 01/12/2014 which showed a 3.6 cm lobulated solid mass in the right upper quadrant of the right breast.  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  Her right axilla lymph node biopsy also showed invasive adenocarcinoma.  She underwent right lumpectomy on 02/17/15. CT scan on 03/18/14 showed multiple sclerotic bone metastases but no visceral disease. She was started on fulvestrant on 12/20/16 but discontinued due to disease progression. At present, cancer has  metastasized to the liver and bone.  Ultrasound of the liver done on 02/17/17 showed innumerable small hypoechoic liver masses the largest measuring 1.8 cm on the left and two-point centimeter on the right. Plan is to do liver biopsy but unfortunately cannot be done at present be due to thrombocytopenia. Plan is also to  put a Port-A-Cath.  These were scheduled as an outpatient. Patient was supposed to have CT chest/abdomen/pelvis and head CT with contrast as an outpatient.  This will be ordered here.  We will also order nuclear bone whole body scan.  Elevated liver enzymes:Also with elevated INR.  Secondary to liver metastases.  Failure to thrive: Reported weight loss of 30 pounds last month.  Nutrition consult will be requested.  Anemia/severe thrombocytopenia: Most likely secondary to liver metastasis.  Platelets count 16,000 today.  Will transfuse her with 2 units of platelets today.  We will continue to monitor her counts.  Generalized weakness/volume depletion: We will continue gentle IV fluids.  PT/OT requested.  Hypertension: We will resume her home medications.  Currently blood pressure stable.  We will continue to monitor.        DVT prophylaxis: SCD Code Status: Full Family Communication: None present at bed side Disposition Plan: Unknown at this point   Consultants: Oncology,Palliative  Procedures:None  Antimicrobials:None  Subjective: Patient seen and examined the bedside this morning.  Feels little better since admission but is still very weak.  Complains of right upper quadrant pain after she eats food.  Objective: Vitals:   02/24/17 1651 02/24/17 2031 02/25/17 0518 02/25/17 1429  BP: 114/63 (!) 122/59 (!) 103/52 115/67  Pulse: 88 91 93 93  Resp: '18 16 16 18  ' Temp: 97.8 F (36.6 C) 98.2 F (36.8 C) 98.2 F (36.8 C) 97.9 F (36.6 C)  TempSrc: Oral Oral Oral Oral  SpO2:  100% 94% 98%  Weight: (!) 150.7 kg (332 lb 3.7 oz)     Height: '5\' 7"'  (1.702  m)       Intake/Output Summary (Last 24 hours) at 02/25/2017 1449 Last data filed at 02/25/2017 1000 Gross per 24 hour  Intake 1497.5 ml  Output -  Net 1497.5 ml   Filed Weights   02/24/17 1609 02/24/17 1651  Weight: (!) 150.7 kg (332 lb 3.7 oz) (!) 150.7 kg (332 lb 3.7 oz)    Examination:  General exam:  Generalized weakness, chronically ill, morbidly obese Respiratory system: Bilateral equal air entry, normal vesicular breath sounds, no wheezes or crackles  Cardiovascular system: S1 & S2 heard, RRR. No JVD, murmurs, rubs, gallops or clicks. No pedal edema. Gastrointestinal system: Abdomen is nondistended, soft .  Hepatomegaly, tenderness in the right upper quadrant, normal bowel sounds heard. Central nervous system: Alert and oriented. No focal neurological deficits. Extremities: No edema, no clubbing ,no cyanosis, distal peripheral pulses palpable. Skin: No cyanosis,No pallor,No Rash,No Ulcer Psychiatry: Judgement and insight appear normal. Mood & affect appropriate.    Data Reviewed: I have personally reviewed following labs and imaging studies  CBC: Recent Labs  Lab 02/24/17 1049 02/25/17 0522  WBC 10.8* 11.4*  NEUTROABS 6.6*  --   HGB 9.3* 7.5*  HCT 31.0* 24.7*  MCV 106.5* 105.6*  PLT 14* 16*   Basic Metabolic Panel: Recent Labs  Lab 02/24/17 1159 02/24/17 1754 02/25/17 0522  NA 138  --  137  K 4.9  --  4.7  CL  --   --  110  CO2 18*  --  20*  GLUCOSE 116  --  94  BUN 21.4  --  19  CREATININE 1.2*  --  0.90  CALCIUM 8.7  --  8.2*  MG  --  2.0  --   PHOS  --  2.6  --    GFR: Estimated Creatinine Clearance: 108.4 mL/min (by C-G formula based on SCr of 0.9 mg/dL). Liver Function Tests: Recent Labs  Lab 02/24/17 1159 02/25/17 0522  AST 107* 108*  ALT 59* 54  ALKPHOS 467* 366*  BILITOT 11.98* 11.9*  PROT 7.1 6.1*  ALBUMIN 2.3* 2.1*   No results for input(s): LIPASE, AMYLASE in the last 168 hours. No results for input(s): AMMONIA in the last 168 hours. Coagulation Profile: Recent Labs  Lab 02/24/17 1049  INR 2.20  PROTIME 26.4*   Cardiac Enzymes: No results for input(s): CKTOTAL, CKMB, CKMBINDEX, TROPONINI in the last 168 hours. BNP (last 3 results) No results for input(s): PROBNP in the last 8760 hours. HbA1C: No results for input(s): HGBA1C in the  last 72 hours. CBG: No results for input(s): GLUCAP in the last 168 hours. Lipid Profile: No results for input(s): CHOL, HDL, LDLCALC, TRIG, CHOLHDL, LDLDIRECT in the last 72 hours. Thyroid Function Tests: Recent Labs    02/24/17 1754  TSH 2.116   Anemia Panel: No results for input(s): VITAMINB12, FOLATE, FERRITIN, TIBC, IRON, RETICCTPCT in the last 72 hours. Sepsis Labs: No results for input(s): PROCALCITON, LATICACIDVEN in the last 168 hours.  Recent Results (from the past 240 hour(s))  TECHNOLOGIST REVIEW     Status: None   Collection Time: 02/24/17 10:49 AM  Result Value Ref Range Status   Technologist Review Metas and Myelocytes,2% blasts, 26% nrbcs present  Final         Radiology Studies: No results found.      Scheduled Meds: . irbesartan  300 mg Oral Daily   And  . amLODipine  10 mg Oral Daily  . loratadine  10 mg Oral Daily  Continuous Infusions: . sodium chloride 75 mL/hr at 02/25/17 0305  . sodium chloride       LOS: 1 day    Time spent: 25 minutes    Chau Savell Jodie Echevaria, MD [facility] Pager [phone]  If 7PM-7AM, please contact night-coverage [email or web address] Password [email or web address] 02/25/2017, 2:49 PM

## 2017-02-25 NOTE — Progress Notes (Signed)
PT Cancellation Note  Patient Details Name: Danielle Oliver MRN: [id number] DOB: December 13, 1960   Cancelled Treatment:     PT order received but eval deferred 2* Hgb @ 7.5 and Platelets 16.  RN aware and agrees with deferral.  Will follow.   Ellakate Gonsalves 02/25/2017, 12:28 PM

## 2017-02-26 ENCOUNTER — Inpatient Hospital Stay (HOSPITAL_COMMUNITY): Payer: BC Managed Care – PPO

## 2017-02-26 ENCOUNTER — Ambulatory Visit: Payer: BC Managed Care – PPO

## 2017-02-26 DIAGNOSIS — Z7189 Other specified counseling: Secondary | ICD-10-CM

## 2017-02-26 DIAGNOSIS — C50919 Malignant neoplasm of unspecified site of unspecified female breast: Secondary | ICD-10-CM

## 2017-02-26 DIAGNOSIS — Z515 Encounter for palliative care: Secondary | ICD-10-CM

## 2017-02-26 LAB — CBC WITH DIFFERENTIAL/PLATELET
BASOS ABS: 0 10*3/uL (ref 0.0–0.1)
Basophils Relative: 0 %
EOS PCT: 3 %
Eosinophils Absolute: 0.4 10*3/uL (ref 0.0–0.7)
HEMATOCRIT: 23.2 % — AB (ref 36.0–46.0)
HEMOGLOBIN: 7.3 g/dL — AB (ref 12.0–15.0)
Lymphocytes Relative: 25 %
Lymphs Abs: 3.6 10*3/uL (ref 0.7–4.0)
MCH: 33.5 pg (ref 26.0–34.0)
MCHC: 31.5 g/dL (ref 30.0–36.0)
MCV: 106.4 fL — AB (ref 78.0–100.0)
METAMYELOCYTES PCT: 1 %
MONOS PCT: 13 %
MYELOCYTES: 5 %
Monocytes Absolute: 1.9 10*3/uL — ABNORMAL HIGH (ref 0.1–1.0)
NRBC: 88 /100{WBCs} — AB
Neutro Abs: 8.5 10*3/uL — ABNORMAL HIGH (ref 1.7–7.7)
Neutrophils Relative %: 53 %
Platelets: 49 10*3/uL — ABNORMAL LOW (ref 150–400)
RBC: 2.18 MIL/uL — AB (ref 3.87–5.11)
RDW: 22.4 % — AB (ref 11.5–15.5)
WBC: 14.4 10*3/uL — AB (ref 4.0–10.5)

## 2017-02-26 LAB — BASIC METABOLIC PANEL
ANION GAP: 7 (ref 5–15)
BUN: 19 mg/dL (ref 6–20)
CHLORIDE: 110 mmol/L (ref 101–111)
CO2: 19 mmol/L — AB (ref 22–32)
Calcium: 8.1 mg/dL — ABNORMAL LOW (ref 8.9–10.3)
Creatinine, Ser: 1.1 mg/dL — ABNORMAL HIGH (ref 0.44–1.00)
GFR calc non Af Amer: 55 mL/min — ABNORMAL LOW (ref >60)
GLUCOSE: 98 mg/dL (ref 65–99)
POTASSIUM: 4.6 mmol/L (ref 3.5–5.1)
Sodium: 136 mmol/L (ref 135–145)

## 2017-02-26 MED ORDER — PREMIER PROTEIN SHAKE
11.0000 [oz_av] | Freq: Two times a day (BID) | ORAL | Status: DC
  Administered 2017-02-27 – 2017-02-28 (×2): 11 [oz_av] via ORAL
  Filled 2017-02-26 (×9): qty 325.31

## 2017-02-26 MED ORDER — DIPHENHYDRAMINE HCL 50 MG/ML IJ SOLN: 50.0000 mg | Freq: Once | INTRAMUSCULAR | Status: AC

## 2017-02-26 MED ORDER — PREDNISONE 50 MG PO TABS
50.0000 mg | ORAL_TABLET | Freq: Four times a day (QID) | ORAL | Status: AC
  Administered 2017-02-26 – 2017-02-27 (×3): 50 mg via ORAL
  Filled 2017-02-26 (×3): qty 1

## 2017-02-26 MED ORDER — TECHNETIUM TC 99M MEDRONATE IV KIT
21.0000 | PACK | Freq: Once | INTRAVENOUS | Status: AC | PRN
  Administered 2017-02-26: 21 via INTRAVENOUS

## 2017-02-26 MED ORDER — LIP MEDEX EX OINT
TOPICAL_OINTMENT | CUTANEOUS | Status: AC
  Filled 2017-02-26: qty 7

## 2017-02-26 MED ORDER — LIP MEDEX EX OINT
TOPICAL_OINTMENT | CUTANEOUS | Status: DC | PRN
  Administered 2017-02-26: 03:00:00 via TOPICAL

## 2017-02-26 MED ORDER — SODIUM CHLORIDE 0.9 % IV SOLN: Freq: Once | INTRAVENOUS | Status: DC

## 2017-02-26 MED ORDER — DIPHENHYDRAMINE HCL 50 MG PO CAPS
50.0000 mg | ORAL_CAPSULE | Freq: Once | ORAL | Status: AC
  Administered 2017-02-27: 50 mg via ORAL
  Filled 2017-02-26: qty 1

## 2017-02-26 NOTE — Consult Note (Signed)
Consultation Note Date: 02/26/2017   Patient Name: Danielle Oliver  DOB: 1960/09/27  MRN: [id number]  Age / Sex: 56 y.o., female  PCP: Helane Rima, MD Referring Physician: Marene Lenz, MD  Reason for Consultation: Establishing goals of care  HPI/Patient Profile: 56 y.o. female  with past medical history of metastatic breast cancer to liver and bone, HTN, SOB with dyspnea, pneumonia admitted on 02/24/2017 with weakness, fatigue, poor intake, thrombocytopenia. Palliative care requested for [place] discussions.   Clinical Assessment and Goals of Care: I met today with Danielle Oliver. No family present at bedside. Danielle Oliver is resting when I come to visit. I introduced palliative care to Danielle Oliver as support for her as her disease has progressed and has been more difficult on her. We discussed the importance of QOL.   Danielle Oliver is tearful as she shares with me that she has really been struggling "to let go" and feeling out of control. She says that she feels this hospitalization has really opened her eyes to recognize her physical limits and that she really is not in control. She also says that she feels she is working on finding a peace with her situation and whatever is to come down the road. She is opening up more to her mother to assist her and come to her appointments so that she doesn't have to bear everything on her own. She says she has shared with her mother what needs to be done "if something happens to her." Her mother did not want to hear this but Danielle [place] says it was important to let her know. She has also been working with her daughter who is 50 yo and preparing her for the side effects of chemotherapy and just gradually allowing her to become more independent. Danielle Oliver is slowly trying to prepare her daughter for her passing. She worries about her mother being too strict on her daughter.   [place]le [place] also has 2 older sons - 1  is about to have a baby of his own in [place] and the other lives in [place] with his father (and is currently not speaking with her which is causing emotional distress but [place]le [place] is also trying to let this go as well). She is preparing herself to begin chemotherapy and for the road ahead. She is learning to live for the day and enjoy each day with time with her family. She is also facing challenges with declining health and loss of independence, stepping away from her job, and even having think about her own mortality.   I will follow up again tomorrow and speak more with Danielle Oliver. Today my focus is on emotional support, listening, and building rapport.   Primary Decision Maker PATIENT    SUMMARY OF RECOMMENDATIONS   Will continue to follow for support and continue [place] conversations Would like to discuss Advance Directives with her  Code Status/Advance Care Planning:  Full code - did not discuss   Symptom Management:   Denies pain or discomfort.   Insomnia: Says this is from IV alarming  all night. Will follow for need for medication assistance.   Palliative Prophylaxis:   Bowel Regimen, Delirium Protocol and Frequent Pain Assessment  Additional Recommendations (Limitations, Scope, Preferences):  Full Scope Treatment  Psycho-social/Spiritual:   Desire for further Chaplaincy support:no  Additional Recommendations: Caregiving  Support/Resources  Prognosis:   Unable to determine  Discharge Planning: To Be Determined      Primary Diagnoses: Present on Admission: . Metastatic breast cancer ([place]) . Thrombocytopenia ([place]) . Metastases to the liver ([place]) . BP (high blood pressure)   I have reviewed the medical record, interviewed the patient and family, and examined the patient. The following aspects are pertinent.  Past Medical History:  Diagnosis Date  . Arthritis   . Breast cancer ([place])   . Dysrhythmia   . Gout   . Heart murmur    one Dr told her, "nothing to  worry about"  . Hypertension   . Morbid obesity with body mass index of 50.0-59.9 in adult [facility])   . Pneumonia 2013 ish  . Shortness of breath dyspnea    With exertion   Social History   Socioeconomic History  . Marital status: Divorced    Spouse name: None  . Number of children: None  . Years of education: None  . Highest education level: None  Social Needs  . Financial resource strain: None  . Food insecurity - worry: None  . Food insecurity - inability: None  . Transportation needs - medical: None  . Transportation needs - non-medical: None  Occupational History  . None  Tobacco Use  . Smoking status: Never Smoker  . Smokeless tobacco: Never Used  Substance and Sexual Activity  . Alcohol use: No    Comment: rare  . Drug use: No  . Sexual activity: None  Other Topics Concern  . None  Social History Narrative  . None   Family History  Problem Relation Age of Onset  . Colon cancer Father    Scheduled Meds: . irbesartan  300 mg Oral Daily   And  . amLODipine  10 mg Oral Daily  . diphenhydrAMINE  50 mg Oral Once   Or  . diphenhydrAMINE  50 mg Intravenous Once  . loratadine  10 mg Oral Daily  . predniSONE  50 mg Oral Q6H  . protein supplement shake  11 oz Oral BID BM   Continuous Infusions: . sodium chloride 75 mL/hr at 02/25/17 0305  . sodium chloride    . sodium chloride     PRN Meds:.diphenhydrAMINE, ibuprofen, lip balm, ondansetron (ZOFRAN) IV, oxyCODONE Allergies  Allergen Reactions  . Iodinated Diagnostic Agents Anaphylaxis  . Penicillins Rash    Has patient had a PCN reaction causing immediate rash, facial/tongue/throat swelling, SOB or lightheadedness with hypotension: Yes  Has patient had a PCN reaction causing severe rash involving mucus membranes or skin necrosis: No Has patient had a PCN reaction that required hospitalization No Has patient had a PCN reaction occurring within the last 10 years: Yes If all of the above answers are "NO", then  may proceed with Cephalosporin use.   Review of Systems  Constitutional: Positive for activity change, appetite change, fatigue and unexpected weight change.  Respiratory: Negative for cough and shortness of breath.   Neurological: Positive for weakness.  Psychiatric/Behavioral: Positive for sleep disturbance.    Physical Exam  Constitutional: She is oriented to person, place, and time. She appears well-developed.  Obese   HENT:  Head: Normocephalic and atraumatic.  Cardiovascular: Normal rate.  Pulmonary/Chest: Effort normal. No accessory muscle usage. No tachypnea. No respiratory distress.  Abdominal: Soft. Normal appearance.  Neurological: She is alert and oriented to person, place, and time.  Nursing note and vitals reviewed.   Vital Signs: BP (!) 85/51   Pulse 87   Temp 97.9 F (36.6 C) (Oral)   Resp 18   Ht '5\' 7"'  (1.702 m)   Wt (!) 150.7 kg (332 lb 3.7 oz)   SpO2 96%   BMI 52.03 kg/m  Pain Assessment: 0-10 POSS *See Group Information*: 1-Acceptable,Awake and alert Pain Score: 7    SpO2: SpO2: 96 % O2 Device:SpO2: 96 % O2 Flow Rate: .   IO: Intake/output summary:   Intake/Output Summary (Last 24 hours) at 02/26/2017 1434 Last data filed at 02/26/2017 1104 Gross per 24 hour  Intake 1122.5 ml  Output -  Net 1122.5 ml    LBM: Last BM Date: 02/26/17 Baseline Weight: Weight: (!) 150.7 kg (332 lb 3.7 oz) Most recent weight: Weight: (!) 150.7 kg (332 lb 3.7 oz)     Palliative Assessment/Data: 40%     Time Total: 60 min  Greater than 50%  of this time was spent counseling and coordinating care related to the above assessment and plan.  Signed by: Vinie Sill, NP Palliative Medicine Team Pager # [phone] (M-F 8a-5p) Team Phone # [phone] (Nights/Weekends)

## 2017-02-26 NOTE — Care Management Note (Signed)
Case Management Note  Patient Details  Name: [place]le [place] MRN: [id number] Date of Birth: [birth year]  Subjective/Objective:                  anemia  Action/Plan: Date: February 26, 2017 Velva Harman, BSN, [place], [place] Chart and notes review for patient progress and needs. Will follow for case management and discharge needs. Next review date: [id number]  Expected Discharge Date:  (unknown)               Expected Discharge Plan:  Home/Self Care  In-House Referral:     Discharge planning Services  CM Consult  Post Acute Care Choice:    Choice offered to:     DME Arranged:    DME Agency:     HH Arranged:    HH Agency:     Status of Service:  In process, will continue to follow  If discussed at Long Length of Stay Meetings, dates discussed:    Additional Comments:  Leeroy Cha, RN 02/26/2017, 8:56 AM

## 2017-02-26 NOTE — Progress Notes (Signed)
PROGRESS NOTE    Danielle Oliver  ZSW:[id number] DOB: 1960/09/28 DOA: 02/24/2017 PCP: Helane Rima, MD   Brief Narrative:  Patient is a 56 year old African-American female with past medical history of metastatic breast cancer with metastases to liver, bone and lung, morbid obesity who was sent here with a direct admission from [facility]/oncology office.  Patient reported of generalized weakness, fatigue on minimal exertion and 30 pound weight loss in the last month. Patient was found to be anemic, thrombocytopenic and with elevation of bilirubin,  INR and liver enzymes.  Assessment & Plan:   Active Problems:   BP (high blood pressure)   Metastases to the liver (HCC)   Thrombocytopenia (HCC)   Metastatic breast cancer (HCC)   Generalized weakness   Metastatic breast cancer: With metastases to liver, bone . Talked to Dr. Julien Nordmann today.  She will be followed by oncology as an outpatient.  In the meantime we can do CT imagings and liver biopsy. Palliative consult was also placed on admission. Patient follows with Dr. Jana Hakim. She was diagnosed with a stage III left breast cancer in 87 when she was 56 years old.  Apparently she had a left mastectomy and 4 cycles of adjuvant chemotherapy at that time. She underwent diagnostic mammogram on 01/12/2014 which showed a 3.6 cm lobulated solid mass in the right upper quadrant of the right breast.  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  Her right axilla lymph node biopsy also showed invasive adenocarcinoma.  She underwent right lumpectomy on 02/17/15. CT scan on 03/18/14 showed multiple sclerotic bone metastases but no visceral disease. She was started on fulvestrant on 12/20/16 but discontinued due to disease progression. At present, cancer has  metastasized to the liver and bone.  Ultrasound of the liver done on 02/17/17  showed innumerable small hypoechoic liver masses the largest measuring 1.8 cm on the left and two-point centimeter on the right. Plan is to do liver biopsy but unfortunately cannot be done at present be due to thrombocytopenia. Plan is also to put a Port-A-Cath.  These were scheduled as an outpatient. Patient was supposed to have CT chest/abdomen/pelvis and head CT with contrast as an outpatient.  This will be ordered here.  We will also order nuclear bone whole body scan. We will order liver biopsy if the platelets reach above 50,000.  Elevated liver enzymes:Also with elevated INR.  Secondary to liver metastases.  Failure to thrive: Reported weight loss of 30 pounds last month.  Nutrition consult  requested.  Anemia/severe thrombocytopenia: Most likely secondary to liver metastasis.  Platelets count 16,000 on admission.  S/P 2 units of platelets with improvement to 49000 today. Will transfuse with 2 more units today. We will continue to monitor her counts.  Generalized weakness/volume depletion: We will continue gentle IV fluids.  PT/OT requested.  Hypertension: We will resume her home medications.  Currently blood pressure stable.  We will continue to monitor.  Headache /Earache: Continue supportive care      DVT prophylaxis: SCD Code Status: Full Family Communication: None present at bed side Disposition Plan: Home in 2-3 days   Consultants: Oncology,Palliative  Procedures:None  Antimicrobials:None  Subjective: Patient seen and examined the patient this morning.  Generalized weakness is improved.  Complains of headache and ear pain.  Objective: Vitals:   02/26/17 0105 02/26/17 0215 02/26/17 1110 02/26/17 1115  BP: (!) 105/51 111/73 (!) 85/51 (!) 85/51  Pulse: 93 94 87   Resp: 18 18    Temp: 97.9 F (36.6 C) 97.9 F (36.6 C)    TempSrc: Oral Oral    SpO2: 92% 96%    Weight:      Height:        Intake/Output Summary (Last 24 hours) at 02/26/2017 1231 Last data  filed at 02/26/2017 1104 Gross per 24 hour  Intake 1122.5 ml  Output -  Net 1122.5 ml   Filed Weights   02/24/17 1609 02/24/17 1651  Weight: (!) 150.7 kg (332 lb 3.7 oz) (!) 150.7 kg (332 lb 3.7 oz)    Examination:  General exam: Appears calm and comfortable ,Not in obvious  Distress,obese Respiratory system: Bilateral equal air entry, normal vesicular breath sounds, no wheezes or crackles  Cardiovascular system: S1 & S2 heard, RRR. No JVD, murmurs, rubs, gallops or clicks. No pedal edema. Gastrointestinal system: Abdomen is mildly distended, soft ,normal bowel sounds heard, mild tenderness on the right upper quadrant, hepatomegaly Central nervous system: Alert and oriented. No focal neurological deficits. Extremities: No edema, no clubbing ,no cyanosis, distal peripheral pulses palpable. Skin: No cyanosis,No pallor,No Rash,No Ulcer Psychiatry: Judgement and insight appear normal. Mood & affect appropriate.  Data Reviewed: I have personally reviewed following labs and imaging studies  CBC: Recent Labs  Lab 02/24/17 1049 02/25/17 0522 02/26/17 0358  WBC 10.8* 11.4* 14.4*  NEUTROABS 6.6*  --  8.5*  HGB 9.3* 7.5* 7.3*  HCT 31.0* 24.7* 23.2*  MCV 106.5* 105.6* 106.4*  PLT 14* 16* 49*   Basic Metabolic Panel: Recent Labs  Lab 02/24/17 1159 02/24/17 1754 02/25/17 0522 02/26/17 0358  NA 138  --  137 136  K 4.9  --  4.7 4.6  CL  --   --  110 110  CO2 18*  --  20* 19*  GLUCOSE 116  --  94 98  BUN 21.4  --  19 19  CREATININE 1.2*  --  0.90 1.10*  CALCIUM 8.7  --  8.2* 8.1*  MG  --  2.0  --   --   PHOS  --  2.6  --   --    GFR: Estimated Creatinine Clearance: 88.7 mL/min (A) (by C-G formula based on SCr of 1.1 mg/dL (H)). Liver Function Tests: Recent Labs  Lab 02/24/17 1159 02/25/17 0522  AST 107* 108*  ALT 59* 54  ALKPHOS 467* 366*  BILITOT 11.98* 11.9*  PROT 7.1 6.1*  ALBUMIN 2.3* 2.1*   No results for input(s): LIPASE, AMYLASE in the last 168 hours. No  results for input(s): AMMONIA in the last 168 hours. Coagulation Profile: Recent Labs  Lab 02/24/17 1049  INR 2.20  PROTIME 26.4*   Cardiac Enzymes: No results for input(s): CKTOTAL, CKMB, CKMBINDEX, TROPONINI in the last 168 hours. BNP (last 3 results) No results for input(s): PROBNP in the last 8760 hours. HbA1C: No results for input(s): HGBA1C in the last 72 hours. CBG: No results for input(s): GLUCAP in the last 168 hours. Lipid Profile: No results for input(s): CHOL, HDL, LDLCALC, TRIG, CHOLHDL, LDLDIRECT in the last 72 hours. Thyroid Function Tests: Recent Labs    02/24/17 1754  TSH 2.116   Anemia Panel: No results for input(s): VITAMINB12, FOLATE, FERRITIN, TIBC, IRON, RETICCTPCT in the last 72 hours. Sepsis Labs: No results for input(s): PROCALCITON, LATICACIDVEN in the last 168 hours.  Recent Results (from the past 240 hour(s))  TECHNOLOGIST REVIEW     Status: None   Collection  Time: 02/24/17 10:49 AM  Result Value Ref Range Status   Technologist Review Metas and Myelocytes,2% blasts, 26% nrbcs present  Final         Radiology Studies: No results found.      Scheduled Meds: . irbesartan  300 mg Oral Daily   And  . amLODipine  10 mg Oral Daily  . diphenhydrAMINE  50 mg Oral Once   Or  . diphenhydrAMINE  50 mg Intravenous Once  . loratadine  10 mg Oral Daily  . predniSONE  50 mg Oral Q6H   Continuous Infusions: . sodium chloride 75 mL/hr at 02/25/17 0305  . sodium chloride    . sodium chloride       LOS: 2 days    Time spent: 25 minutes    Sarah Zerby Jodie Echevaria, MD [facility] Pager [phone]  If 7PM-7AM, please contact night-coverage [email or web address] Password [facility] 02/26/2017, 12:31 PM

## 2017-02-26 NOTE — Progress Notes (Signed)
Initial Nutrition Assessment  DOCUMENTATION CODES:   Morbid obesity  INTERVENTION:   Provide Premier Protein BID, each supplement provides 160kcal and 30g protein.   NUTRITION DIAGNOSIS:   Increased nutrient needs related to cancer and cancer related treatments as evidenced by estimated needs.  GOAL:   Patient will meet greater than or equal to 90% of their needs  MONITOR:   PO intake, Supplement acceptance, Weight trends, Labs  REASON FOR ASSESSMENT:   Consult Assessment of nutrition requirement/status  ASSESSMENT:   Pt with PMH significant for metastatic breast cancer with mets to liver and bone, , Gout, and HTN. Breast cancer history began 44 when she was diagnosed with stage III breast cancer. Received chemotherapy and had left mastectomy at that time. Recently 02/13/15 pt has right lumpectomy. Pt had ultrasound 02/17/17 that showed innumerable liver masses. Pt currently awaiting liver biopsy and is planned for Port-A-Cath placement. Presents this admission with elevated liver enzymes and failure to thrive.    RD consulted for calorie count and assessment. Do not find calorie count appropriate at this time as patient has had decreased appetite for two weeks. During this time pt would eat two meals per day. On a typical day, pt consumes oatmeal/egg whites, skips lunch, and has a bigger dinner. This admission meal completions range from 25-50%. Discussed the importance of protein intake for preservation of lean body mass going forward. Suggested that if appetite remains poor, pt could begin using protein supplementation. Provided examples. Amendable to trying premier protein this stay.  Pt reports she lost 30 lb within one month time frame from her UBW of 360 lb. Records indicate pt has lost 7.7% of body weight since Sept 2018. This is significant.   Nutrition-Focused physical exam completed.   Medications reviewed and include: prednisone Labs reviewed: Creatinine 1.10 (H)  AST 366 (H) AST 108 (H)   NUTRITION - FOCUSED PHYSICAL EXAM:    Most Recent Value  Orbital Region  No depletion  Upper Arm Region  No depletion  Thoracic and Lumbar Region  No depletion  Buccal Region  No depletion  Temple Region  No depletion  Clavicle Bone Region  No depletion  Clavicle and Acromion Bone Region  No depletion  Scapular Bone Region  No depletion  Dorsal Hand  No depletion  Patellar Region  No depletion  Anterior Thigh Region  No depletion  Posterior Calf Region  No depletion  Edema (RD Assessment)  Mild  Hair  Reviewed  Eyes  Reviewed  Mouth  Reviewed  Skin  Reviewed  Nails  Reviewed      Diet Order:  Diet heart healthy/carb modified Room service appropriate? Yes; Fluid consistency: Thin  EDUCATION NEEDS:   Education needs have been addressed  Skin:  Skin Assessment: Reviewed RN Assessment  Last BM:  02/26/17  Height:   Ht Readings from Last 1 Encounters:  02/24/17 5\' 7"  (1.702 m)    Weight:   Wt Readings from Last 1 Encounters:  02/24/17 (!) 332 lb 3.7 oz (150.7 kg)    Ideal Body Weight:  67.3 kg  BMI:  Body mass index is 52.03 kg/m.  Estimated Nutritional Needs:   Kcal:  1800-2000 kcal/day  Protein:  80-90 g/day  Fluid:  >1.6 L/day    Mariana Single RD, LDN Clinical Nutrition Pager # - [phone]

## 2017-02-26 NOTE — Progress Notes (Signed)
PT Cancellation Note  Patient Details Name: Danielle Oliver MRN: [id number] DOB: [birth year]   Cancelled Treatment:     PT deferred this date, pt for bone scan and then to receive 2 units of platelets.  Will follow.   Lafawn Lenoir 02/26/2017, 2:47 PM

## 2017-02-26 NOTE — Progress Notes (Addendum)
CT scan to be done  8:00 02/27/2017. Bone scan unable to be finished today,Pt states she was strapped to the table an it was too uncomfortable for her.

## 2017-02-27 ENCOUNTER — Inpatient Hospital Stay (HOSPITAL_COMMUNITY): Payer: BC Managed Care – PPO

## 2017-02-27 ENCOUNTER — Encounter (HOSPITAL_COMMUNITY): Payer: Self-pay | Admitting: General Surgery

## 2017-02-27 DIAGNOSIS — C787 Secondary malignant neoplasm of liver and intrahepatic bile duct: Principal | ICD-10-CM

## 2017-02-27 DIAGNOSIS — C50411 Malignant neoplasm of upper-outer quadrant of right female breast: Secondary | ICD-10-CM

## 2017-02-27 DIAGNOSIS — E559 Vitamin D deficiency, unspecified: Secondary | ICD-10-CM

## 2017-02-27 DIAGNOSIS — C7951 Secondary malignant neoplasm of bone: Secondary | ICD-10-CM

## 2017-02-27 HISTORY — PX: IR US GUIDE VASC ACCESS RIGHT: IMG2390

## 2017-02-27 HISTORY — PX: IR US GUIDE BX ASP/DRAIN: IMG2392

## 2017-02-27 HISTORY — PX: IR FLUORO GUIDE PORT INSERTION RIGHT: IMG5741

## 2017-02-27 LAB — PREPARE PLATELET PHERESIS
UNIT DIVISION: 0
UNIT DIVISION: 0
Unit division: 0
Unit division: 0

## 2017-02-27 LAB — CBC WITH DIFFERENTIAL/PLATELET
BASOS ABS: 0 10*3/uL (ref 0.0–0.1)
Basophils Relative: 0 %
Eosinophils Absolute: 0.7 10*3/uL (ref 0.0–0.7)
Eosinophils Relative: 4 %
HEMATOCRIT: 25.4 % — AB (ref 36.0–46.0)
Hemoglobin: 7.8 g/dL — ABNORMAL LOW (ref 12.0–15.0)
LYMPHS ABS: 3.6 10*3/uL (ref 0.7–4.0)
Lymphocytes Relative: 20 %
MCH: 32.4 pg (ref 26.0–34.0)
MCHC: 30.7 g/dL (ref 30.0–36.0)
MCV: 105.4 fL — AB (ref 78.0–100.0)
MONOS PCT: 13 %
MYELOCYTES: 4 %
Metamyelocytes Relative: 1 %
Monocytes Absolute: 2.4 10*3/uL — ABNORMAL HIGH (ref 0.1–1.0)
NEUTROS ABS: 11.4 10*3/uL — AB (ref 1.7–7.7)
Neutrophils Relative %: 58 %
Platelets: 53 10*3/uL — ABNORMAL LOW (ref 150–400)
RBC: 2.41 MIL/uL — AB (ref 3.87–5.11)
RDW: 22.1 % — AB (ref 11.5–15.5)
WBC: 18.1 10*3/uL — AB (ref 4.0–10.5)
nRBC: 48 /100 WBC — ABNORMAL HIGH

## 2017-02-27 LAB — BPAM PLATELET PHERESIS
BLOOD PRODUCT EXPIRATION DATE: 201812272359
BLOOD PRODUCT EXPIRATION DATE: 201812282359
Blood Product Expiration Date: 201812272359
Blood Product Expiration Date: 201812272359
ISSUE DATE / TIME: 201812251747
ISSUE DATE / TIME: 201812260038
ISSUE DATE / TIME: 201812261700
ISSUE DATE / TIME: 201812261831
UNIT TYPE AND RH: 6200
Unit Type and Rh: 6200
Unit Type and Rh: 6200
Unit Type and Rh: 6200

## 2017-02-27 LAB — PROTIME-INR
INR: 1.22
Prothrombin Time: 15.3 seconds — ABNORMAL HIGH (ref 11.4–15.2)

## 2017-02-27 MED ORDER — LIDOCAINE-EPINEPHRINE 2 %-1:100000 IJ SOLN
INTRAMUSCULAR | Status: AC | PRN
  Administered 2017-02-27: 20 mL

## 2017-02-27 MED ORDER — MIDAZOLAM HCL 2 MG/2ML IJ SOLN
INTRAMUSCULAR | Status: AC | PRN
  Administered 2017-02-27 (×2): 1 mg via INTRAVENOUS

## 2017-02-27 MED ORDER — IOPAMIDOL (ISOVUE-300) INJECTION 61%
INTRAVENOUS | Status: AC
  Filled 2017-02-27: qty 100

## 2017-02-27 MED ORDER — FENTANYL CITRATE (PF) 100 MCG/2ML IJ SOLN
INTRAMUSCULAR | Status: AC | PRN
  Administered 2017-02-27 (×3): 50 ug via INTRAVENOUS

## 2017-02-27 MED ORDER — MIDAZOLAM HCL 2 MG/2ML IJ SOLN
INTRAMUSCULAR | Status: AC | PRN
  Administered 2017-02-27 (×3): 1 mg via INTRAVENOUS

## 2017-02-27 MED ORDER — FENTANYL CITRATE (PF) 100 MCG/2ML IJ SOLN
INTRAMUSCULAR | Status: AC
  Filled 2017-02-27: qty 8

## 2017-02-27 MED ORDER — LEVOFLOXACIN IN D5W 750 MG/150ML IV SOLN
750.0000 mg | INTRAVENOUS | Status: DC
  Administered 2017-02-27 – 2017-03-01 (×3): 750 mg via INTRAVENOUS
  Filled 2017-02-27 (×5): qty 150

## 2017-02-27 MED ORDER — LIDOCAINE-EPINEPHRINE (PF) 2 %-1:200000 IJ SOLN
INTRAMUSCULAR | Status: AC
  Filled 2017-02-27: qty 20

## 2017-02-27 MED ORDER — IOPAMIDOL (ISOVUE-300) INJECTION 61%
100.0000 mL | Freq: Once | INTRAVENOUS | Status: AC | PRN
  Administered 2017-02-27: 100 mL via INTRAVENOUS

## 2017-02-27 MED ORDER — GELATIN ABSORBABLE 12-7 MM EX MISC
CUTANEOUS | Status: AC
  Filled 2017-02-27: qty 1

## 2017-02-27 MED ORDER — FENTANYL CITRATE (PF) 100 MCG/2ML IJ SOLN
INTRAMUSCULAR | Status: AC | PRN
  Administered 2017-02-27 (×2): 50 ug via INTRAVENOUS

## 2017-02-27 MED ORDER — MIDAZOLAM HCL 2 MG/2ML IJ SOLN
INTRAMUSCULAR | Status: AC
  Filled 2017-02-27: qty 8

## 2017-02-27 NOTE — Progress Notes (Signed)
Daily Progress Note   Patient Name: Danielle Oliver       Date: 02/27/2017 DOB: [birth year]  Age: 56 y.o. MRN#: [id number] Attending Physician: Marene Lenz, MD Primary Care Physician: Helane Rima, MD Admit Date: 02/24/2017  Reason for Consultation/Follow-up: Establishing goals of care  Subjective: Danielle Oliver is lying in bed. Complains of no sleep last night d/t antecubital IV alarming. She has a new IV today.   Length of Stay: 3  Current Medications: Scheduled Meds:  . irbesartan  300 mg Oral Daily   And  . amLODipine  10 mg Oral Daily  . iopamidol      . loratadine  10 mg Oral Daily  . protein supplement shake  11 oz Oral BID BM    Continuous Infusions: . sodium chloride 75 mL/hr at 02/25/17 0305  . sodium chloride    . sodium chloride    . levofloxacin (LEVAQUIN) IV      PRN Meds: diphenhydrAMINE, ibuprofen, lip balm, ondansetron (ZOFRAN) IV, oxyCODONE  Physical Exam         Constitutional: She is oriented to person, place, and time. She appears well-developed.  Obese   HENT:  Head: Normocephalic and atraumatic.  Cardiovascular: Normal rate.  Pulmonary/Chest: Effort normal. No accessory muscle usage. No tachypnea. No respiratory distress.  Abdominal: Soft. Normal appearance.  Neurological: She is alert and oriented to person, place, and time.  Nursing note and vitals reviewed.   Vital Signs: BP (!) 128/59 (BP Location: Right Wrist)   Pulse 91   Temp 97.6 F (36.4 C) (Oral)   Resp 19   Ht 5' 7" (1.702 m)   Wt (!) 150.7 kg (332 lb 3.7 oz)   SpO2 94%   BMI 52.03 kg/m  SpO2: SpO2: 94 % O2 Device: O2 Device: Not Delivered O2 Flow Rate:    Intake/output summary:   Intake/Output Summary (Last 24 hours) at 02/27/2017 1125 Last data filed at 02/26/2017  1944 Gross per 24 hour  Intake 3739.25 ml  Output -  Net 3739.25 ml   LBM: Last BM Date: 02/26/17 Baseline Weight: Weight: (!) 150.7 kg (332 lb 3.7 oz) Most recent weight: Weight: (!) 150.7 kg (332 lb 3.7 oz)       Palliative Assessment/Data: 30%    Flowsheet Rows     Most Recent  Value  Intake Tab  Referral Department  Hospitalist  Unit at Time of Referral  Med/Surg Unit  Palliative Care Primary Diagnosis  Cancer  Date Notified  02/25/17  Palliative Care Type  New Palliative care  Reason for referral  Clarify Goals of Care  Date of Admission  02/24/17  Date first seen by Palliative Care  02/26/17  # of days Palliative referral response time  1 Day(s)  # of days IP prior to Palliative referral  1  Clinical Assessment  Psychosocial & Spiritual Assessment  Palliative Care Outcomes      Patient Active Problem List   Diagnosis Date Noted  . Goals of care, counseling/discussion   . Palliative care encounter   . Generalized weakness 02/25/2017  . Hypertension complicating diabetes (HCC) 02/25/2017  . Metastatic breast cancer (HCC) 02/24/2017  . Metastases to the liver (HCC) 02/19/2017  . Thrombocytopenia (HCC) 02/19/2017  . Breast cancer metastasized to bone (HCC) 11/14/2014  . Malignant neoplasm of upper-outer quadrant of right breast in female, estrogen receptor positive (HCC) 01/14/2014  . BP (high blood pressure) 03/30/2012    Palliative Care Assessment & Plan   HPI: 56 y.o. female  with past medical history of metastatic breast cancer to liver and bone, HTN, SOB with dyspnea, pneumonia admitted on 02/24/2017 with weakness, fatigue, poor intake, thrombocytopenia. Palliative care requested for GOC discussions.   Assessment: I met again today with Danielle Oliver. She has had a busy few days and plans for liver biopsy this afternoon. She is anxious about biopsy and says she had a bad experience with breast biopsy (no done in [facility]). Offered reassurance that IR provider and  nursing staff will ensure her comfort during and after procedure. Also encouraged her to express her fears regarding procedure to IR staff so they will be aware of how to support her.   Introduced the idea of considering Advance Directive planning. Danielle Oliver seems interested but does not wish to pursue currently. Explained that the Cancer Center can assist her with more information as desired and that we can always send a copy of Living Will home with her to review as desired. She is excited but also nervous about potentially going home tomorrow. Discussed nutritional supplements and to keep large cup ice water always to drink throughout day for better hydration. Offered emotional support.   Recommendations/Plan:  Denies pain/discomfort  Anxious about liver biopsy  Goals of Care and Additional Recommendations:  Limitations on Scope of Treatment: Full Scope Treatment  Code Status:  Full code  Prognosis:   Unable to determine  Discharge Planning:  Home with Home Health  Thank you for allowing the Palliative Medicine Team to assist in the care of this patient.   Total Time 15 min Prolonged Time Billed  no       Greater than 50%  of this time was spent counseling and coordinating care related to the above assessment and plan.  Alicia Parker, NP Palliative Medicine Team Pager # [phone] (M-F 8a-5p) Team Phone # [phone] (Nights/Weekends)      

## 2017-02-27 NOTE — Sedation Documentation (Signed)
Pt states she is not comfortable.

## 2017-02-27 NOTE — Evaluation (Signed)
Physical Therapy Evaluation Patient Details Name: Danielle Oliver MRN: [id number] DOB: 1961/01/28 Today's Date: 02/27/2017   History of Present Illness  Danielle Oliver is a 56 y.o. female with a history of breast cancer with recurrent  breast cancer in 2015. She has now been found to have extensive bony involvement and likely extensive disease in her liver.  She was admitted 12/24 secondary to SOB from multifocal PNA, low platelets and low HGb, and increased INR.  Clinical Impression  The patient  Does not require physical assistance for mobility in room environment. Is noted to be SOB with activity. Will assess further mobility next visit. Recommend OT consult. Tresa Endo PT [phone]     Follow Up Recommendations Home health PT    Equipment Recommendations  (TBD)    Recommendations for Other Services OT consult     Precautions / Restrictions Precautions Precautions: Fall Precaution Comments: check labs for low values before seeing patient( PLT, HGB)     Mobility  Bed Mobility Overal bed mobility: Needs Assistance Bed Mobility: Supine to Sit;Sit to Supine     Supine to sit: Independent Sit to supine: Independent      Transfers Overall transfer level: Needs assistance Equipment used: None Transfers: Sit to/from Stand Sit to Stand: Supervision         General transfer comment: no assistance  Ambulation/Gait Ambulation/Gait assistance: Min guard Ambulation Distance (Feet): 20 Feet(x 2) Assistive device: None Gait Pattern/deviations: Step-through pattern;Wide base of support     General Gait Details: generally able to mobilize without UE support, Is noted with dyspnea 2-3/4 after short distance in room. Planning BX soon so limited mobility.  Stairs            Wheelchair Mobility    Modified Rankin (Stroke Patients Only)       Balance Overall balance assessment: Needs assistance   Sitting balance-Leahy Scale: Normal       Standing  balance-Leahy Scale: Good                               Pertinent Vitals/Pain Pain Assessment: No/denies pain    Home Living Family/patient expects to be discharged to:: Private residence Living Arrangements: Parent;Children Available Help at Discharge: Family Type of Home: House Home Access: Stairs to enter Entrance Stairs-Rails: Right Entrance Stairs-Number of Steps: 3 Home Layout: Two level;1/2 bath on main level        Prior Function Level of Independence: Independent               Hand Dominance        Extremity/Trunk Assessment   Upper Extremity Assessment Upper Extremity Assessment: Generalized weakness    Lower Extremity Assessment Lower Extremity Assessment: Generalized weakness    Cervical / Trunk Assessment Cervical / Trunk Assessment: Normal  Communication   Communication: No difficulties  Cognition Arousal/Alertness: Awake/alert Behavior During Therapy: WFL for tasks assessed/performed Overall Cognitive Status: Within Functional Limits for tasks assessed                                        General Comments      Exercises     Assessment/Plan    PT Assessment Patient needs continued PT services  PT Problem List Decreased strength;Decreased activity tolerance;Decreased mobility       PT Treatment Interventions Gait training;DME instruction;Stair training;Functional  mobility training;Therapeutic activities;Therapeutic exercise;Patient/family education    PT Goals (Current goals can be found in the Care Plan section)  Acute Rehab PT Goals Patient Stated Goal: to go home, be able to do things without being SOB. PT Goal Formulation: With patient Time For Goal Achievement: 03/13/17 Potential to Achieve Goals: Good    Frequency Min 3X/week   Barriers to discharge        Co-evaluation               AM-PAC PT "6 Clicks" Daily Activity  Outcome Measure Difficulty turning over in bed (including  adjusting bedclothes, sheets and blankets)?: None Difficulty moving from lying on back to sitting on the side of the bed? : None Difficulty sitting down on and standing up from a chair with arms (e.g., wheelchair, bedside commode, etc,.)?: None Help needed moving to and from a bed to chair (including a wheelchair)?: None Help needed walking in hospital room?: A Little Help needed climbing 3-5 steps with a railing? : A Lot 6 Click Score: 21    End of Session   Activity Tolerance: Patient tolerated treatment well Patient left: in bed Nurse Communication: Mobility status PT Visit Diagnosis: Difficulty in walking, not elsewhere classified (R26.2)    Time: 9794-8016 PT Time Calculation (min) (ACUTE ONLY): 11 min   Charges:   PT Evaluation $PT Eval Low Complexity: 1 Low     PT G CodesClaretha Cooper 02/27/2017, 2:33 PM Tresa Endo PT [phone]

## 2017-02-27 NOTE — Progress Notes (Signed)
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    I saw Danielle Oliver on 12/24 and she was incredibly weak and deconditioned.  She looks much improved today as compared to last week. She tells me that she feels much better.  She is getting up and around by herself.  She has met with nutrition and is eating better.  She is currently NPO pending liver biopsy with IR.  She has also met with palliative care.  She denies fevers, chills, nausea/vomiting, constipation, diarrhea, pain, or any other concerns today.  Her strength is improved.  She is in much better spirits.  She tells me that despite not being at home she had a good Christmas and enjoyed her daughter calling her Christmas morning.     Objective:  Vitals:   02/26/17 2126 02/27/17 0554  BP: 123/67 (!) 128/59  Pulse: 95 91  Resp: (!) 21 19  Temp: (!) 97.5 F (36.4 C) 97.6 F (36.4 C)  SpO2: 94% 94%    Body mass index is 52.03 kg/m.  Intake/Output Summary (Last 24 hours) at 02/27/2017 1310 Last data filed at 02/26/2017 1944 Gross per 24 hour  Intake 3739.25 ml  Output -  Net 3739.25 ml     Sclerae unicteric  Oropharynx shows no thrush or other lesions  No cervical or supraclavicular adenopathy  Lungs no rales or wheezes-  Heart regular rate and rhythm  Abdomen soft, +BS  Neuro nonfocal    CBG (last 3)  No results for input(s): GLUCAP in the last 72 hours.   Labs:  Lab Results  Component Value Date   WBC 18.1 (H) 02/27/2017   HGB 7.8 (L) 02/27/2017   HCT 25.4 (L) 02/27/2017   MCV 105.4 (H) 02/27/2017   PLT 53 (L) 02/27/2017   NEUTROABS 11.4 (H) 02/27/2017    '@LASTCHEMISTRY' @  Urine Studies No results for input(s): UHGB, CRYS in the last 72 hours.  Invalid input(s): UACOL, UAPR, USPG, UPH, UTP, UGL,  UKET, UBIL, UNIT, UROB, ULEU, UEPI, UWBC, URBC, UBAC, CAST, [place], [place]  Basic Metabolic Panel: Recent Labs  Lab 02/24/17 1159 02/24/17 1754 02/25/17 0522 02/26/17 0358  NA 138  --  137 136  K 4.9  --  4.7 4.6  CL  --   --  110 110  CO2 18*  --  20* 19*  GLUCOSE 116  --  94 98  BUN 21.4  --  19 19  CREATININE 1.2*  --  0.90 1.10*  CALCIUM 8.7  --  8.2* 8.1*  MG  --  2.0  --   --   PHOS  --  2.6  --   --    GFR Estimated Creatinine Clearance: 88.7 mL/min (A) (by C-G formula based on SCr of 1.1 mg/dL (H)). Liver Function Tests: Recent Labs  Lab 02/24/17 1159 02/25/17 0522  AST 107* 108*  ALT 59* 54  ALKPHOS 467* 366*  BILITOT 11.98* 11.9*  PROT 7.1 6.1*  ALBUMIN 2.3* 2.1*   No results for input(s): LIPASE, AMYLASE in the last 168 hours. No results for input(s): AMMONIA in the last 168 hours. Coagulation profile Recent Labs  Lab 02/24/17 1049 02/27/17 1055  INR 2.20 1.22  PROTIME 26.4*  --     CBC: Recent Labs  Lab 02/24/17  1049 02/25/17 0522 02/26/17 0358 02/27/17 0540  WBC 10.8* 11.4* 14.4* 18.1*  NEUTROABS 6.6*  --  8.5* 11.4*  HGB 9.3* 7.5* 7.3* 7.8*  HCT 31.0* 24.7* 23.2* 25.4*  MCV 106.5* 105.6* 106.4* 105.4*  PLT 14* 16* 49* 53*   Cardiac Enzymes: No results for input(s): CKTOTAL, CKMB, CKMBINDEX, TROPONINI in the last 168 hours. BNP: Invalid input(s): POCBNP CBG: No results for input(s): GLUCAP in the last 168 hours. D-Dimer No results for input(s): DDIMER in the last 72 hours. Hgb A1c No results for input(s): HGBA1C in the last 72 hours. Lipid Profile No results for input(s): CHOL, HDL, LDLCALC, TRIG, CHOLHDL, LDLDIRECT in the last 72 hours. Thyroid function studies Recent Labs    02/24/17 1754  TSH 2.116   Anemia work up No results for input(s): VITAMINB12, FOLATE, FERRITIN, TIBC, IRON, RETICCTPCT in the last 72 hours. Microbiology Recent Results (from the past 240 hour(s))  TECHNOLOGIST REVIEW     Status: None   Collection  Time: 02/24/17 10:49 AM  Result Value Ref Range Status   Technologist Review Metas and Myelocytes,2% blasts, 26% nrbcs present  Final      Studies:  Ct Head W & Wo Contrast  Result Date: 02/27/2017 CLINICAL DATA:  56 year old female. Metastatic breast cancer staging. EXAM: CT HEAD WITHOUT AND WITH CONTRAST TECHNIQUE: Contiguous axial images were obtained from the base of the skull through the vertex without and with intravenous contrast CONTRAST:  13 hour steroid premedication. Contrast tolerated with no adverse events. 114m ISOVUE-300 IOPAMIDOL (ISOVUE-300) INJECTION 61% in conjunction with contrast enhanced imaging of the chest, abdomen, and pelvis reported separately. COMPARISON:  Skull radiographs 06/12/2016. FINDINGS: Brain: Partially empty sella. Cerebral volume within normal limits. No midline shift, ventriculomegaly, mass effect, evidence of mass lesion, intracranial hemorrhage or evidence of cortically based acute infarction. Gray-white matter differentiation is within normal limits throughout the brain. No abnormal enhancement identified.  No encephalomalacia identified. Vascular: The major intracranial vascular structures appear to be enhancing and patent. Skull: Widespread sclerotic osseous metastatic disease. Discrete sclerotic lesions most apparent at the right lower clivus, and in both frontal bones (e.g. Series 14, image 17). No destructive osseous lesion identified. Sinuses/Orbits: Paranasal sinuses are well pneumatized. Left greater than right mastoid effusions. Tympanic cavities remain clear. Other: Leftward gaze deviation. Otherwise negative visible orbit and scalp soft tissues. IMPRESSION: 1. Diffuse sclerotic metastatic disease throughout the skull and visible facial bones. No destructive osseous lesion identified. 2. No metastatic disease to the brain identified; negative CT appearance of the brain. Electronically Signed   By: HGenevie AnnM.D.   On: 02/27/2017 08:58   Ct Chest W  Contrast  Result Date: 02/27/2017 CLINICAL DATA:  56year old female with a history of metastatic breast cancer. EXAM: CT CHEST, ABDOMEN, AND PELVIS WITH CONTRAST TECHNIQUE: Multidetector CT imaging of the chest, abdomen and pelvis was performed following the standard protocol during bolus administration of intravenous contrast. CONTRAST:  1031mISOVUE-300 IOPAMIDOL (ISOVUE-300) INJECTION 61% COMPARISON:  Ultrasound 02/17/2017, CT 01/03/2017, 07/17/2016, 02/29/2016, 02/24/2015 FINDINGS: CT CHEST FINDINGS Cardiovascular: Heart size within normal limits. No pericardial fluid/ thickening. Minimal calcifications of the mitral annulus and in the distribution of the circumflex coronary artery. Unremarkable course caliber and contour of the thoracic aorta. Unremarkable diameter of the main pulmonary artery. Mediastinum/Nodes: Unremarkable appearance of the thoracic inlet. No mediastinal lymphadenopathy. Unremarkable course of the thoracic esophagus. Lungs/Pleura: Mixed ground-glass and nodular opacity of the bilateral lungs, predominantly right-sided though involving all lobes. No endotracheal or endobronchial debris.  Pattern of opacity predominantly within the distal peribronchovascular distribution. Trace left-sided pleural effusion. CT ABDOMEN PELVIS FINDINGS Hepatobiliary: Hepatomegaly, with the greatest cranial caudal diameter of the right liver measuring 24 cm. This 2 phase contrast-enhanced CT demonstrates no focal mass within the liver, however, in overall heterogeneous pattern of enhancement/ attenuation with a nodular contour. When compared to prior CT of 2016, the overall size measures slightly larger, with increase nodular contour. Cholelithiasis.  No evidence of inflammatory changes. Pancreas: Unremarkable pancreas Spleen: Cranial caudal span of spleen measures 14.4 cm Adrenals/Urinary Tract: Unremarkable appearance of the kidneys with no hydronephrosis or nephrolithiasis. No inflammatory changes.  Unremarkable course of the bilateral ureters. Unremarkable urinary bladder. Stomach/Bowel: Unremarkable stomach. Unremarkable small bowel appear read Normal appendix. Colonic diverticulae without evidence of acute inflammatory changes. Vascular/Lymphatic: Scattered calcifications of the abdominal aorta. No adenopathy. Reproductive: Unremarkable appearance of the uterus and the adnexa Other: Fat containing umbilical hernia. Musculoskeletal: Widespread metastatic disease of the axial and appendicular skeleton. All of the thoracic and lumbar vertebral bodies demonstrate sclerotic/blastic changes. Degenerative changes are present. No acute pathologic fracture of the thoracic or lumbar levels. Diffuse sclerotic changes of the bilateral hemipelvis and the sacrum. Sclerotic changes of the sternum. Sclerotic changes of the visualized bilateral humerus, bilateral clavicles, and throughout all the bilateral ribs. Sclerotic changes of the proximal bilateral femurs. Surgical changes of left mastectomy. Surgical changes of the right axilla. IMPRESSION: Interval development of multifocal pneumonia. It is uncertain if the previously identified lung nodules on the CT 01/03/2017 represented early inflammatory/infectious changes, or if these represent pulmonary metastases. Attention on future imaging may be useful. Trace left pleural fluid. Widespread skeletal metastases throughout the visualized axial and appendicular skeleton. No pathologic fracture identified. 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 Splenomegaly Cholelithiasis without evidence of acute cholecystitis. Diverticular disease without evidence of acute diverticulitis. Fat containing umbilical hernia. Electronically Signed   By: Corrie Mckusick D.O.   On: 02/27/2017 09:12   Nm Bone Scan Whole Body  Result  Date: 02/26/2017 CLINICAL DATA:  History of breast cancer with bony metastases. EXAM: NUCLEAR MEDICINE WHOLE BODY BONE SCAN TECHNIQUE: Whole body anterior and posterior images were obtained approximately 3 hours after intravenous injection of radiopharmaceutical. RADIOPHARMACEUTICALS:  21.0 mCi Technetium-68mMDP IV COMPARISON:  02/28/2016 FINDINGS: Normal soft tissue and renal uptake. Abnormal radiotracer uptake within the sternum, clavicles, bilateral humeral heads, bilateral proximal femurs, sacrum, bilateral medial ileal bones, numerous thoracic vertebral bodies, and numerous bilateral ribs. Abnormal heterogeneous radiotracer uptake within the calvarium. Compared to the prior study the radiotracer activity within the sternum, bilateral ribs, and bilateral proximal femurs have increased. IMPRESSION: Evidence of progression of widespread skeletal metastatic disease, with increased activity within the sternum, bilateral ribs and bilateral proximal femurs. Electronically Signed   By: DFidela SalisburyM.D.   On: 02/26/2017 15:36   Ct Abdomen Pelvis W Contrast  Result Date: 02/27/2017 CLINICAL DATA:  56year old female with a history of metastatic breast cancer. EXAM: CT CHEST, ABDOMEN, AND PELVIS WITH CONTRAST TECHNIQUE: Multidetector CT imaging of the chest, abdomen and pelvis was performed following the standard protocol during bolus administration of intravenous contrast. CONTRAST:  1054mISOVUE-300 IOPAMIDOL (ISOVUE-300) INJECTION 61% COMPARISON:  Ultrasound 02/17/2017, CT 01/03/2017, 07/17/2016, 02/29/2016, 02/24/2015 FINDINGS: CT CHEST FINDINGS Cardiovascular: Heart size within normal limits. No pericardial fluid/ thickening. Minimal calcifications of the mitral annulus and in the distribution of the  circumflex coronary artery. Unremarkable course caliber and contour of the thoracic aorta. Unremarkable diameter of the main pulmonary artery. Mediastinum/Nodes: Unremarkable appearance of the thoracic  inlet. No mediastinal lymphadenopathy. Unremarkable course of the thoracic esophagus. Lungs/Pleura: Mixed ground-glass and nodular opacity of the bilateral lungs, predominantly right-sided though involving all lobes. No endotracheal or endobronchial debris. Pattern of opacity predominantly within the distal peribronchovascular distribution. Trace left-sided pleural effusion. CT ABDOMEN PELVIS FINDINGS Hepatobiliary: Hepatomegaly, with the greatest cranial caudal diameter of the right liver measuring 24 cm. This 2 phase contrast-enhanced CT demonstrates no focal mass within the liver, however, in overall heterogeneous pattern of enhancement/ attenuation with a nodular contour. When compared to prior CT of 2016, the overall size measures slightly larger, with increase nodular contour. Cholelithiasis.  No evidence of inflammatory changes. Pancreas: Unremarkable pancreas Spleen: Cranial caudal span of spleen measures 14.4 cm Adrenals/Urinary Tract: Unremarkable appearance of the kidneys with no hydronephrosis or nephrolithiasis. No inflammatory changes. Unremarkable course of the bilateral ureters. Unremarkable urinary bladder. Stomach/Bowel: Unremarkable stomach. Unremarkable small bowel appear read Normal appendix. Colonic diverticulae without evidence of acute inflammatory changes. Vascular/Lymphatic: Scattered calcifications of the abdominal aorta. No adenopathy. Reproductive: Unremarkable appearance of the uterus and the adnexa Other: Fat containing umbilical hernia. Musculoskeletal: Widespread metastatic disease of the axial and appendicular skeleton. All of the thoracic and lumbar vertebral bodies demonstrate sclerotic/blastic changes. Degenerative changes are present. No acute pathologic fracture of the thoracic or lumbar levels. Diffuse sclerotic changes of the bilateral hemipelvis and the sacrum. Sclerotic changes of the sternum. Sclerotic changes of the visualized bilateral humerus, bilateral clavicles, and  throughout all the bilateral ribs. Sclerotic changes of the proximal bilateral femurs. Surgical changes of left mastectomy. Surgical changes of the right axilla. IMPRESSION: Interval development of multifocal pneumonia. It is uncertain if the previously identified lung nodules on the CT 01/03/2017 represented early inflammatory/infectious changes, or if these represent pulmonary metastases. Attention on future imaging may be useful. Trace left pleural fluid. Widespread skeletal metastases throughout the visualized axial and appendicular skeleton. No pathologic fracture identified. 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 Splenomegaly Cholelithiasis without evidence of acute cholecystitis. Diverticular disease without evidence of acute diverticulitis. Fat containing umbilical hernia. Electronically Signed   By: Corrie Mckusick D.O.   On: 02/27/2017 09:12    Assessment: 56 y.o. (1) status post left mastectomy in 1991 followed by adjuvant chemotherapy Consisting of doxorubicin and cyclophosphamide given every 21 days 4 (records under "media")  (2) status post right breast upper outer quadrant biopsy 01/13/2014 for a  cT2 p N1, stage IIB invasive ductal carcinoma, estrogen and progesterone receptor positive, HER-2 negative, with an MIB-1 of 10%.  METASTATIC DISEASE: CT scan 03/18/2014 shows multiple sclerotic bone mets but no visceral disease at that time             (1) no bone biopsy obtained to this point             (b) CA 27-29 is informative  (3) anastrozole started January 2016, discontinued September 2018 because of persistent low counts with palbociclib             (a)  palbociclib added 11/21/2014 at 125 mg daily, 21/7             (b) Palbociclib dose decreased to 100 mg daily, 21/ 7, with cycle 2, starting 12/21/2014              (  c) palbociclib held after 11/07/2016 because of continuing low counts             (d) anastrozole discontinued December 2018 with evidence of disease progression  (4) vitamin D deficiency: On replacement  (5) status post right lumpectomy 02/17/2015 for a pT2 pNX invasive ductal carcinoma, grade 1, estrogen receptor 100% positive, progesterone receptor 20% positive, HER-2 not amplified. Margins were negative             (a) radiation therapy to lumpectomy site declined by patient  (6) denosumab/ Xgeva started 12/15/2014, repeated monthly  (7) genetics testing discussed--patient demurs  (8) started fulvestrant 12/20/2016, the last dose 02/14/2017, discontinued with disease progression  (9) RESTAGING December 2018             (a) abdominal ultrasound 02/17/2017 shows multiple liver lesions             (b) CT scans of the brain, chest, abdomen and pelvis above             (c) liver biopsy pending   Plan:   Metastatic breast cancer: CT chest abdomen and pelvis demonstrate diffuse bony metastases, and liver metastases.  Would recommend liver biopsy, port placement so she can start treatment.  She has seen palliative care.    Failure to thrive/Deconditioning: Much improved, she is doing better, would recommend PT/OT eval prior to her leaving the hospital if possible.  She has also seen nutrition and has been eating better.  Encouraged continued PO intake once she isn't NPO.  Thrombocytopenia: Transfuse platelets when indicated.    Dispo: We will see patient as an outpatient early next week to f/u as she will likely be discharged in the next 1-2 days.     Scot Dock, NP 02/27/2017  1:10 PM Medical Oncology and Hematology [facility] [address] [place], [place] [address] Tel. [phone]    Fax. [phone]  Attending Note  I personally saw the patient, reviewed the chart and examined the patient. The plan of care was discussed with the patient  I  agree with the assessment and plan as documented above.  Metastatic breast cancer: Patient underwent liver biopsy and port placement today. Previously she had failure to thrive and deconditioning but overall she has felt markedly better since she has been in the hospital.  She also underwent platelet transfusion. She is likely to be discharged home tomorrow.

## 2017-02-27 NOTE — Consult Note (Signed)
Chief Complaint: metastatic breast cancer  Referring Physician:Dr. Lurline Del  Supervising Physician: Sandi Mariscal  Patient Status: [name] - In-pt  HPI: Danielle Oliver is a 56 y.o. female with a history of breast cancer in 1991 that was treated.  In 2015 she was found to have breast cancer again and underwent surgery and treatment.  Unfortunately, she has now been found to have extensive bony involvement and likely extensive disease in her liver.  She has currently been admitted secondary to SOB from multifocal PNA.  She was also found to have an elevated INR from liver disease as well thrombocytopenia.  She has been transfused with platelets and they are now up to 53K from 16K.  Her INR has also come down from 2.2 to 1.2.  A request has been made for a liver lesion bx as well as a PAC placement.  Past Medical History:  Past Medical History:  Diagnosis Date  . Arthritis   . Breast cancer ([place])   . Dysrhythmia   . Gout   . Heart murmur    one Dr told her, "nothing to worry about"  . Hypertension   . Morbid obesity with body mass index of 50.0-59.9 in adult [facility])   . Pneumonia 2013 ish  . Shortness of breath dyspnea    With exertion    Past Surgical History:  Past Surgical History:  Procedure Laterality Date  . BREAST LUMPECTOMY WITH RADIOACTIVE SEED LOCALIZATION Right 02/17/2015   Procedure: RIGHT BREAST LUMPECTOMY WITH RADIOACTIVE SEED LOCALIZATION;  Surgeon: Alphonsa Overall, MD;  Location: [place];  Service: General;  Laterality: Right;  . COLONOSCOPY    . FRACTURE SURGERY Right   . MASTECTOMY Left 1992  . TONSILLECTOMY      Family History:  Family History  Problem Relation Age of Onset  . Colon cancer Father     Social History:  reports that  has never smoked. she has never used smokeless tobacco. She reports that she does not drink alcohol or use drugs.  Allergies:  Allergies  Allergen Reactions  . Iodinated Diagnostic Agents Anaphylaxis  . Penicillins Rash   Has patient had a PCN reaction causing immediate rash, facial/tongue/throat swelling, SOB or lightheadedness with hypotension: Yes  Has patient had a PCN reaction causing severe rash involving mucus membranes or skin necrosis: No Has patient had a PCN reaction that required hospitalization No Has patient had a PCN reaction occurring within the last 10 years: Yes If all of the above answers are "NO", then may proceed with Cephalosporin use.    Medications: Medications reviewed in epic  Please HPI for pertinent positives, otherwise complete 10 system ROS negative.  Mallampati Score: MD Evaluation Airway: WNL Heart: WNL Abdomen: Other (comments) Abdomen comments: morbidly obese Chest/ Lungs: Other (comments) Chest/ lungs comments: few right-sided rhonchi ASA  Classification: 3 Mallampati/Airway Score: Two  Physical Exam: BP (!) 128/59 (BP Location: Right Wrist)   Pulse 91   Temp 97.6 F (36.4 C) (Oral)   Resp 19   Ht '5\' 7"'  (1.702 m)   Wt (!) 332 lb 3.7 oz (150.7 kg)   SpO2 94%   BMI 52.03 kg/m  Body mass index is 52.03 kg/m. General: pleasant, morbidly obese black female who is laying in bed in NAD HEENT: head is normocephalic, atraumatic.  Sclera are noninjected.  PERRL.  Ears and nose without any masses or lesions.  Mouth is pink and moist Heart: regular, rate, and rhythm.  Normal s1,s2. No obvious murmurs,  gallops, or rubs noted.  Palpable radial pulses bilaterally Lungs: right-sided rhonchi noted.  Respiratory effort nonlabored Abd: soft, morbidly obese, ND, +BS, no masses, unable to palpate organomegaly due to obesity. Psych: A&Ox3 with an appropriate affect.   Labs: Results for orders placed or performed during the hospital encounter of 02/24/17 (from the past 48 hour(s))  Type and screen [place]     Status: None   Collection Time: 02/25/17  3:25 PM  Result Value Ref Range   ABO/RH(D) O POS    Antibody Screen NEG    Sample Expiration  02/28/2017   Prepare Pheresed Platelets     Status: None   Collection Time: 02/25/17  3:29 PM  Result Value Ref Range   Unit Number [id number]    Blood Component Type PLTP LR1 PAS    Unit division 00    Status of Unit ISSUED,FINAL    Transfusion Status OK TO TRANSFUSE    Unit Number [id number]    Blood Component Type PLTP LR2 PAS    Unit division 00    Status of Unit ISSUED,FINAL    Transfusion Status OK TO TRANSFUSE   ABO/Rh     Status: None   Collection Time: 02/25/17  3:35 PM  Result Value Ref Range   ABO/RH(D) O POS   CBC with Differential/Platelet     Status: Abnormal   Collection Time: 02/26/17  3:58 AM  Result Value Ref Range   WBC 14.4 (H) 4.0 - 10.5 K/uL    Comment: WHITE COUNT CONFIRMED ON SMEAR ADJUSTED FOR NUCLEATED RBC'S    RBC 2.18 (L) 3.87 - 5.11 MIL/uL   Hemoglobin 7.3 (L) 12.0 - 15.0 g/dL   HCT 23.2 (L) 36.0 - 46.0 %   MCV 106.4 (H) 78.0 - 100.0 fL   MCH 33.5 26.0 - 34.0 pg   MCHC 31.5 30.0 - 36.0 g/dL   RDW 22.4 (H) 11.5 - 15.5 %   Platelets 49 (L) 150 - 400 K/uL    Comment: CONSISTENT WITH PREVIOUS RESULT   Neutrophils Relative % 53 %   Lymphocytes Relative 25 %   Monocytes Relative 13 %   Eosinophils Relative 3 %   Basophils Relative 0 %   Metamyelocytes Relative 1 %   Myelocytes 5 %   nRBC 88 (H) 0 /100 WBC   Neutro Abs 8.5 (H) 1.7 - 7.7 K/uL   Lymphs Abs 3.6 0.7 - 4.0 K/uL   Monocytes Absolute 1.9 (H) 0.1 - 1.0 K/uL   Eosinophils Absolute 0.4 0.0 - 0.7 K/uL   Basophils Absolute 0.0 0.0 - 0.1 K/uL   RBC Morphology POLYCHROMASIA PRESENT    WBC Morphology      MODERATE LEFT SHIFT (>5% METAS AND MYELOS,OCC PRO NOTED)  Basic metabolic panel     Status: Abnormal   Collection Time: 02/26/17  3:58 AM  Result Value Ref Range   Sodium 136 135 - 145 mmol/L   Potassium 4.6 3.5 - 5.1 mmol/L   Chloride 110 101 - 111 mmol/L   CO2 19 (L) 22 - 32 mmol/L   Glucose, Bld 98 65 - 99 mg/dL   BUN 19 6 - 20 mg/dL   Creatinine, Ser 1.10 (H) 0.44 - 1.00  mg/dL   Calcium 8.1 (L) 8.9 - 10.3 mg/dL   GFR calc non Af Amer 55 (L) >60 mL/min   GFR calc Af Amer >60 >60 mL/min    Comment: (NOTE) The eGFR has been calculated using the CKD EPI equation.  This calculation has not been validated in all clinical situations. eGFR's persistently <60 mL/min signify possible Chronic Kidney Disease.    Anion gap 7 5 - 15  Prepare Pheresed Platelets     Status: None   Collection Time: 02/26/17 12:58 PM  Result Value Ref Range   Unit Number [id number]    Blood Component Type PLTPH LI2 PAS    Unit division 00    Status of Unit ISSUED,FINAL    Transfusion Status OK TO TRANSFUSE    Unit Number [id number]    Blood Component Type PLTP LR2 PAS    Unit division 00    Status of Unit ISSUED,FINAL    Transfusion Status OK TO TRANSFUSE   CBC with Differential/Platelet     Status: Abnormal   Collection Time: 02/27/17  5:40 AM  Result Value Ref Range   WBC 18.1 (H) 4.0 - 10.5 K/uL    Comment: WHITE COUNT CONFIRMED ON SMEAR ADJUSTED FOR NUCLEATED RBC'S    RBC 2.41 (L) 3.87 - 5.11 MIL/uL   Hemoglobin 7.8 (L) 12.0 - 15.0 g/dL   HCT 25.4 (L) 36.0 - 46.0 %   MCV 105.4 (H) 78.0 - 100.0 fL   MCH 32.4 26.0 - 34.0 pg   MCHC 30.7 30.0 - 36.0 g/dL   RDW 22.1 (H) 11.5 - 15.5 %   Platelets 53 (L) 150 - 400 K/uL    Comment: CONSISTENT WITH PREVIOUS RESULT RESULTS VERIFIED VIA RECOLLECT    Neutrophils Relative % 58 %   Lymphocytes Relative 20 %   Monocytes Relative 13 %   Eosinophils Relative 4 %   Basophils Relative 0 %   Metamyelocytes Relative 1 %   Myelocytes 4 %   nRBC 48 (H) 0 /100 WBC   Neutro Abs 11.4 (H) 1.7 - 7.7 K/uL   Lymphs Abs 3.6 0.7 - 4.0 K/uL   Monocytes Absolute 2.4 (H) 0.1 - 1.0 K/uL   Eosinophils Absolute 0.7 0.0 - 0.7 K/uL   Basophils Absolute 0.0 0.0 - 0.1 K/uL   RBC Morphology POLYCHROMASIA PRESENT    WBC Morphology MILD LEFT SHIFT (1-5% METAS, OCC MYELO, OCC BANDS)   Protime-INR     Status: Abnormal   Collection Time: 02/27/17  10:55 AM  Result Value Ref Range   Prothrombin Time 15.3 (H) 11.4 - 15.2 seconds   INR 1.22     Imaging: Ct Head W & Wo Contrast  Result Date: 02/27/2017 CLINICAL DATA:  56 year old female. Metastatic breast cancer staging. EXAM: CT HEAD WITHOUT AND WITH CONTRAST TECHNIQUE: Contiguous axial images were obtained from the base of the skull through the vertex without and with intravenous contrast CONTRAST:  13 hour steroid premedication. Contrast tolerated with no adverse events. 197m ISOVUE-300 IOPAMIDOL (ISOVUE-300) INJECTION 61% in conjunction with contrast enhanced imaging of the chest, abdomen, and pelvis reported separately. COMPARISON:  Skull radiographs 06/12/2016. FINDINGS: Brain: Partially empty sella. Cerebral volume within normal limits. No midline shift, ventriculomegaly, mass effect, evidence of mass lesion, intracranial hemorrhage or evidence of cortically based acute infarction. Gray-white matter differentiation is within normal limits throughout the brain. No abnormal enhancement identified.  No encephalomalacia identified. Vascular: The major intracranial vascular structures appear to be enhancing and patent. Skull: Widespread sclerotic osseous metastatic disease. Discrete sclerotic lesions most apparent at the right lower clivus, and in both frontal bones (e.g. Series 14, image 17). No destructive osseous lesion identified. Sinuses/Orbits: Paranasal sinuses are well pneumatized. Left greater than right mastoid effusions. Tympanic cavities remain clear. Other: Leftward  gaze deviation. Otherwise negative visible orbit and scalp soft tissues. IMPRESSION: 1. Diffuse sclerotic metastatic disease throughout the skull and visible facial bones. No destructive osseous lesion identified. 2. No metastatic disease to the brain identified; negative CT appearance of the brain. Electronically Signed   By: Genevie Ann M.D.   On: 02/27/2017 08:58   Ct Chest W Contrast  Result Date: 02/27/2017 CLINICAL  DATA:  56 year old female with a history of metastatic breast cancer. EXAM: CT CHEST, ABDOMEN, AND PELVIS WITH CONTRAST TECHNIQUE: Multidetector CT imaging of the chest, abdomen and pelvis was performed following the standard protocol during bolus administration of intravenous contrast. CONTRAST:  166m ISOVUE-300 IOPAMIDOL (ISOVUE-300) INJECTION 61% COMPARISON:  Ultrasound 02/17/2017, CT 01/03/2017, 07/17/2016, 02/29/2016, 02/24/2015 FINDINGS: CT CHEST FINDINGS Cardiovascular: Heart size within normal limits. No pericardial fluid/ thickening. Minimal calcifications of the mitral annulus and in the distribution of the circumflex coronary artery. Unremarkable course caliber and contour of the thoracic aorta. Unremarkable diameter of the main pulmonary artery. Mediastinum/Nodes: Unremarkable appearance of the thoracic inlet. No mediastinal lymphadenopathy. Unremarkable course of the thoracic esophagus. Lungs/Pleura: Mixed ground-glass and nodular opacity of the bilateral lungs, predominantly right-sided though involving all lobes. No endotracheal or endobronchial debris. Pattern of opacity predominantly within the distal peribronchovascular distribution. Trace left-sided pleural effusion. CT ABDOMEN PELVIS FINDINGS Hepatobiliary: Hepatomegaly, with the greatest cranial caudal diameter of the right liver measuring 24 cm. This 2 phase contrast-enhanced CT demonstrates no focal mass within the liver, however, in overall heterogeneous pattern of enhancement/ attenuation with a nodular contour. When compared to prior CT of 2016, the overall size measures slightly larger, with increase nodular contour. Cholelithiasis.  No evidence of inflammatory changes. Pancreas: Unremarkable pancreas Spleen: Cranial caudal span of spleen measures 14.4 cm Adrenals/Urinary Tract: Unremarkable appearance of the kidneys with no hydronephrosis or nephrolithiasis. No inflammatory changes. Unremarkable course of the bilateral ureters.  Unremarkable urinary bladder. Stomach/Bowel: Unremarkable stomach. Unremarkable small bowel appear read Normal appendix. Colonic diverticulae without evidence of acute inflammatory changes. Vascular/Lymphatic: Scattered calcifications of the abdominal aorta. No adenopathy. Reproductive: Unremarkable appearance of the uterus and the adnexa Other: Fat containing umbilical hernia. Musculoskeletal: Widespread metastatic disease of the axial and appendicular skeleton. All of the thoracic and lumbar vertebral bodies demonstrate sclerotic/blastic changes. Degenerative changes are present. No acute pathologic fracture of the thoracic or lumbar levels. Diffuse sclerotic changes of the bilateral hemipelvis and the sacrum. Sclerotic changes of the sternum. Sclerotic changes of the visualized bilateral humerus, bilateral clavicles, and throughout all the bilateral ribs. Sclerotic changes of the proximal bilateral femurs. Surgical changes of left mastectomy. Surgical changes of the right axilla. IMPRESSION: Interval development of multifocal pneumonia. It is uncertain if the previously identified lung nodules on the CT 01/03/2017 represented early inflammatory/infectious changes, or if these represent pulmonary metastases. Attention on future imaging may be useful. Trace left pleural fluid. Widespread skeletal metastases throughout the visualized axial and appendicular skeleton. No pathologic fracture identified. 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 Splenomegaly Cholelithiasis without evidence of acute cholecystitis. Diverticular disease without evidence of acute diverticulitis. Fat containing umbilical hernia. Electronically Signed   By: JCorrie MckusickD.O.   On: 02/27/2017 09:12   Nm Bone Scan Whole Body  Result Date: 02/26/2017 CLINICAL DATA:  History of  breast cancer with bony metastases. EXAM: NUCLEAR MEDICINE WHOLE BODY BONE SCAN TECHNIQUE: Whole body anterior and posterior images were obtained  approximately 3 hours after intravenous injection of radiopharmaceutical. RADIOPHARMACEUTICALS:  21.0 mCi Technetium-33mMDP IV COMPARISON:  02/28/2016 FINDINGS: Normal soft tissue and renal uptake. Abnormal radiotracer uptake within the sternum, clavicles, bilateral humeral heads, bilateral proximal femurs, sacrum, bilateral medial ileal bones, numerous thoracic vertebral bodies, and numerous bilateral ribs. Abnormal heterogeneous radiotracer uptake within the calvarium. Compared to the prior study the radiotracer activity within the sternum, bilateral ribs, and bilateral proximal femurs have increased. IMPRESSION: Evidence of progression of widespread skeletal metastatic disease, with increased activity within the sternum, bilateral ribs and bilateral proximal femurs. Electronically Signed   By: DFidela SalisburyM.D.   On: 02/26/2017 15:36   Ct Abdomen Pelvis W Contrast  Result Date: 02/27/2017 CLINICAL DATA:  56year old female with a history of metastatic breast cancer. EXAM: CT CHEST, ABDOMEN, AND PELVIS WITH CONTRAST TECHNIQUE: Multidetector CT imaging of the chest, abdomen and pelvis was performed following the standard protocol during bolus administration of intravenous contrast. CONTRAST:  1056mISOVUE-300 IOPAMIDOL (ISOVUE-300) INJECTION 61% COMPARISON:  Ultrasound 02/17/2017, CT 01/03/2017, 07/17/2016, 02/29/2016, 02/24/2015 FINDINGS: CT CHEST FINDINGS Cardiovascular: Heart size within normal limits. No pericardial fluid/ thickening. Minimal calcifications of the mitral annulus and in the distribution of the circumflex coronary artery. Unremarkable course caliber and contour of the thoracic aorta. Unremarkable diameter of the main pulmonary artery. Mediastinum/Nodes: Unremarkable appearance of the thoracic inlet. No mediastinal lymphadenopathy.  Unremarkable course of the thoracic esophagus. Lungs/Pleura: Mixed ground-glass and nodular opacity of the bilateral lungs, predominantly right-sided though involving all lobes. No endotracheal or endobronchial debris. Pattern of opacity predominantly within the distal peribronchovascular distribution. Trace left-sided pleural effusion. CT ABDOMEN PELVIS FINDINGS Hepatobiliary: Hepatomegaly, with the greatest cranial caudal diameter of the right liver measuring 24 cm. This 2 phase contrast-enhanced CT demonstrates no focal mass within the liver, however, in overall heterogeneous pattern of enhancement/ attenuation with a nodular contour. When compared to prior CT of 2016, the overall size measures slightly larger, with increase nodular contour. Cholelithiasis.  No evidence of inflammatory changes. Pancreas: Unremarkable pancreas Spleen: Cranial caudal span of spleen measures 14.4 cm Adrenals/Urinary Tract: Unremarkable appearance of the kidneys with no hydronephrosis or nephrolithiasis. No inflammatory changes. Unremarkable course of the bilateral ureters. Unremarkable urinary bladder. Stomach/Bowel: Unremarkable stomach. Unremarkable small bowel appear read Normal appendix. Colonic diverticulae without evidence of acute inflammatory changes. Vascular/Lymphatic: Scattered calcifications of the abdominal aorta. No adenopathy. Reproductive: Unremarkable appearance of the uterus and the adnexa Other: Fat containing umbilical hernia. Musculoskeletal: Widespread metastatic disease of the axial and appendicular skeleton. All of the thoracic and lumbar vertebral bodies demonstrate sclerotic/blastic changes. Degenerative changes are present. No acute pathologic fracture of the thoracic or lumbar levels. Diffuse sclerotic changes of the bilateral hemipelvis and the sacrum. Sclerotic changes of the sternum. Sclerotic changes of the visualized bilateral humerus, bilateral clavicles, and throughout all the bilateral ribs.  Sclerotic changes of the proximal bilateral femurs. Surgical changes of left mastectomy. Surgical changes of the right axilla. IMPRESSION: Interval development of multifocal pneumonia. It is uncertain if the previously identified lung nodules on the CT 01/03/2017 represented early inflammatory/infectious changes, or if these represent pulmonary metastases. Attention on future imaging may be useful. Trace left pleural fluid. Widespread skeletal metastases throughout the visualized axial and appendicular skeleton. No pathologic fracture identified. 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 Splenomegaly Cholelithiasis without evidence of acute cholecystitis. Diverticular disease without evidence of acute  diverticulitis. Fat containing umbilical hernia. Electronically Signed   By: Corrie Mckusick D.O.   On: 02/27/2017 09:12    Assessment/Plan 1. Metastatic breast cancer, liver lesions  We will plan to proceed with a liver biopsy today as well as PAC placement.  The patient's INR is down to 1.2 and her platelets are up to 53K.  The patient is very nervous about any procedures but is agreeable to proceed.  Risks and benefits discussed with the patient including, but not limited to bleeding, infection, damage to adjacent structures or low yield requiring additional tests. All of the patient's questions were answered, patient is agreeable to proceed. Consent signed and in chart.  Risks and benefits discussed with the patient including, but not limited to bleeding, infection, pneumothorax, or fibrin sheath development and need for additional procedures. All of the patient's questions were answered, patient is agreeable to proceed. Consent signed and in chart.  Thank you for this interesting consult.  I greatly enjoyed meeting Danielle Oliver and look  forward to participating in their care.  A copy of this report was sent to the requesting provider on this date.  Electronically Signed: Henreitta Cea 02/27/2017, 1:43 PM   I spent a total of 40 Minutes   in face to face in clinical consultation, greater than 50% of which was counseling/coordinating care for metastatic breast cancer

## 2017-02-27 NOTE — Progress Notes (Signed)
Status post liver biopsy and portacath insertion, vital signs  monitored, remained stable, no severe pain, bleeding or hematoma observed,

## 2017-02-27 NOTE — Procedures (Signed)
Pre Procedure Dx: Metastatic Breast Cancer Post Procedural Dx: Same  Successful placement of right IJ approach port-a-cath with tip at the superior caval atrial junction. The catheter is ready for immediate use.  Technically successful US guided biopsy of indeterminate lesion within the right lobe of the liver  EBL: Minimal  No immediate complications.   Ronny Bacon, MD Pager #: [phone]

## 2017-02-27 NOTE — Progress Notes (Signed)
PROGRESS NOTE    Danielle Oliver  OTL:[id number] DOB: 01/19/1961 DOA: 02/24/2017 PCP: Helane Rima, MD   Brief Narrative:  Patient is a 56 year old African-American female with past medical history of metastatic breast cancer with metastases to liver, bone and lung, morbid obesity who was sent here with a direct admission from [facility]/oncology office.  Patient reported of generalized weakness, fatigue on minimal exertion and 30 pound weight loss in the last month. Patient was found to be anemic, thrombocytopenic and with elevation of bilirubin,  INR and liver enzymes.  Assessment & Plan:   Active Problems:   BP (high blood pressure)   Metastases to the liver (HCC)   Thrombocytopenia (HCC)   Metastatic breast cancer (HCC)   Generalized weakness   Goals of care, counseling/discussion   Palliative care encounter   Metastatic breast cancer: With metastases to liver, bone .Likely lungs also Oncology following.  She will be followed by oncology as an outpatient. Palliative consult was also placed on admission. The patient will undergo liver biopsy and PAC placement here by IR. Patient follows with Dr. Jana Hakim. She was diagnosed with a stage III left breast cancer in 31 when she was 56 years old.  Apparently she had a left mastectomy and 4 cycles of adjuvant chemotherapy at that time. She underwent diagnostic mammogram on 01/12/2014 which showed a 3.6 cm lobulated solid mass in the right upper quadrant of the right breast.  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  Her right axilla lymph node biopsy also showed invasive adenocarcinoma.  She underwent right lumpectomy on 02/17/15. CT scan on 03/18/14 showed multiple sclerotic bone metastases but no visceral disease. She was started on fulvestrant on 12/20/16 but discontinued due to disease progression. At present,  cancer has  metastasized to the liver and bone.  Ultrasound of the liver done on 02/17/17 showed innumerable small hypoechoic liver masses the largest measuring 1.8 cm on the left and two-point centimeter on the right.  On this admission: Nuclear bone scan :Evidence of progression of widespread skeletal metastatic disease, with increased activity within the sternum, bilateral ribs and bilateral proximal femurs  CT head :Does not show any brain metastasis but diffuse sclerotic metastatic disease throughout the skull and visible facial bones. No destructive osseous lesion identified  CT abdomen/pelvis/chest:Mixed ground-glass and nodular opacity of the bilateral lungs, predominantly right-sided though involving all lobes.  Multifocal pneumonia versus metastatic disease.Likely this is metastatic disease.  Patient will be started on Levaquin.  She is febrile and her lungs are clear at present. Also showed hepatic metastatic disease.  Elevated liver enzymes:  Secondary to liver metastases.  Failure to thrive: Reported weight loss of 30 pounds last month.  Nutrition consult  requested.  Anemia/severe thrombocytopenia: Most likely secondary to liver metastasis.  Platelets count 16,000 on admission.  S/P 4 units of platelets with improvement to 53000 today.  Generalized weakness/volume depletion: We will continue gentle IV fluids.  PT/OT requested.  Hypertension: We will resume her home medications.  Currently blood pressure stable.  We will continue to monitor.  Headache /Earache: Continue supportive care.Much improved.   DVT prophylaxis: SCD Code Status: Full Family Communication: None present at bed side Disposition Plan: Home in 2-3 days   Consultants: Oncology,Palliative  Procedures:None  Antimicrobials:None  Subjective: Patient seen and examined the patient this morning.  Appears more comfortable today.  Denies any abdominal pain  Objective: Vitals:  02/26/17 1843 02/26/17  1944 02/26/17 2126 02/27/17 0554  BP: 115/69 (!) 117/96 123/67 (!) 128/59  Pulse: 99 92 95 91  Resp: 20 20 (!) 21 19  Temp: 97.6 F (36.4 C) 97.8 F (36.6 C) (!) 97.5 F (36.4 C) 97.6 F (36.4 C)  TempSrc: Oral Oral Oral Oral  SpO2: 94% 98% 94% 94%  Weight:      Height:        Intake/Output Summary (Last 24 hours) at 02/27/2017 1448 Last data filed at 02/26/2017 1944 Gross per 24 hour  Intake 1163 ml  Output -  Net 1163 ml   Filed Weights   02/24/17 1609 02/24/17 1651  Weight: (!) 150.7 kg (332 lb 3.7 oz) (!) 150.7 kg (332 lb 3.7 oz)    Examination: \ General exam: Appears calm and comfortable ,Not in distress,average built Respiratory system: Bilateral equal air entry, normal vesicular breath sounds, no wheezes or crackles  Cardiovascular system: S1 & S2 heard, RRR. No JVD, murmurs, rubs, gallops or clicks. No pedal edema. Gastrointestinal system: Abdomen is mildly distended, soft and nontender. Normal bowel sounds heard. Central nervous system: Alert and oriented. No focal neurological deficits. Extremities: No edema, no clubbing ,no cyanosis, distal peripheral pulses palpable. Skin: No cyanosis,No pallor,No Rash,No Ulcer Psychiatry: Judgement and insight appear normal. Mood & affect appropriate.      Data Reviewed: I have personally reviewed following labs and imaging studies  CBC: Recent Labs  Lab 02/24/17 1049 02/25/17 0522 02/26/17 0358 02/27/17 0540  WBC 10.8* 11.4* 14.4* 18.1*  NEUTROABS 6.6*  --  8.5* 11.4*  HGB 9.3* 7.5* 7.3* 7.8*  HCT 31.0* 24.7* 23.2* 25.4*  MCV 106.5* 105.6* 106.4* 105.4*  PLT 14* 16* 49* 53*   Basic Metabolic Panel: Recent Labs  Lab 02/24/17 1159 02/24/17 1754 02/25/17 0522 02/26/17 0358  NA 138  --  137 136  K 4.9  --  4.7 4.6  CL  --   --  110 110  CO2 18*  --  20* 19*  GLUCOSE 116  --  94 98  BUN 21.4  --  19 19  CREATININE 1.2*  --  0.90 1.10*  CALCIUM 8.7  --  8.2* 8.1*  MG  --  2.0  --   --   PHOS  --  2.6   --   --    GFR: Estimated Creatinine Clearance: 88.7 mL/min (A) (by C-G formula based on SCr of 1.1 mg/dL (H)). Liver Function Tests: Recent Labs  Lab 02/24/17 1159 02/25/17 0522  AST 107* 108*  ALT 59* 54  ALKPHOS 467* 366*  BILITOT 11.98* 11.9*  PROT 7.1 6.1*  ALBUMIN 2.3* 2.1*   No results for input(s): LIPASE, AMYLASE in the last 168 hours. No results for input(s): AMMONIA in the last 168 hours. Coagulation Profile: Recent Labs  Lab 02/24/17 1049 02/27/17 1055  INR 2.20 1.22  PROTIME 26.4*  --    Cardiac Enzymes: No results for input(s): CKTOTAL, CKMB, CKMBINDEX, TROPONINI in the last 168 hours. BNP (last 3 results) No results for input(s): PROBNP in the last 8760 hours. HbA1C: No results for input(s): HGBA1C in the last 72 hours. CBG: No results for input(s): GLUCAP in the last 168 hours. Lipid Profile: No results for input(s): CHOL, HDL, LDLCALC, TRIG, CHOLHDL, LDLDIRECT in the last 72 hours. Thyroid Function Tests: Recent Labs    02/24/17 1754  TSH 2.116   Anemia Panel: No results for input(s): VITAMINB12, FOLATE, FERRITIN, TIBC, IRON, RETICCTPCT in the  last 72 hours. Sepsis Labs: No results for input(s): PROCALCITON, LATICACIDVEN in the last 168 hours.  Recent Results (from the past 240 hour(s))  TECHNOLOGIST REVIEW     Status: None   Collection Time: 02/24/17 10:49 AM  Result Value Ref Range Status   Technologist Review Metas and Myelocytes,2% blasts, 26% nrbcs present  Final         Radiology Studies: Ct Head W & Wo Contrast  Result Date: 02/27/2017 CLINICAL DATA:  56 year old female. Metastatic breast cancer staging. EXAM: CT HEAD WITHOUT AND WITH CONTRAST TECHNIQUE: Contiguous axial images were obtained from the base of the skull through the vertex without and with intravenous contrast CONTRAST:  13 hour steroid premedication. Contrast tolerated with no adverse events. 128m ISOVUE-300 IOPAMIDOL (ISOVUE-300) INJECTION 61% in conjunction with  contrast enhanced imaging of the chest, abdomen, and pelvis reported separately. COMPARISON:  Skull radiographs 06/12/2016. FINDINGS: Brain: Partially empty sella. Cerebral volume within normal limits. No midline shift, ventriculomegaly, mass effect, evidence of mass lesion, intracranial hemorrhage or evidence of cortically based acute infarction. Gray-white matter differentiation is within normal limits throughout the brain. No abnormal enhancement identified.  No encephalomalacia identified. Vascular: The major intracranial vascular structures appear to be enhancing and patent. Skull: Widespread sclerotic osseous metastatic disease. Discrete sclerotic lesions most apparent at the right lower clivus, and in both frontal bones (e.g. Series 14, image 17). No destructive osseous lesion identified. Sinuses/Orbits: Paranasal sinuses are well pneumatized. Left greater than right mastoid effusions. Tympanic cavities remain clear. Other: Leftward gaze deviation. Otherwise negative visible orbit and scalp soft tissues. IMPRESSION: 1. Diffuse sclerotic metastatic disease throughout the skull and visible facial bones. No destructive osseous lesion identified. 2. No metastatic disease to the brain identified; negative CT appearance of the brain. Electronically Signed   By: HGenevie AnnM.D.   On: 02/27/2017 08:58   Ct Chest W Contrast  Result Date: 02/27/2017 CLINICAL DATA:  56year old female with a history of metastatic breast cancer. EXAM: CT CHEST, ABDOMEN, AND PELVIS WITH CONTRAST TECHNIQUE: Multidetector CT imaging of the chest, abdomen and pelvis was performed following the standard protocol during bolus administration of intravenous contrast. CONTRAST:  1047mISOVUE-300 IOPAMIDOL (ISOVUE-300) INJECTION 61% COMPARISON:  Ultrasound 02/17/2017, CT 01/03/2017, 07/17/2016, 02/29/2016, 02/24/2015 FINDINGS: CT CHEST FINDINGS Cardiovascular: Heart size within normal limits. No pericardial fluid/ thickening. Minimal  calcifications of the mitral annulus and in the distribution of the circumflex coronary artery. Unremarkable course caliber and contour of the thoracic aorta. Unremarkable diameter of the main pulmonary artery. Mediastinum/Nodes: Unremarkable appearance of the thoracic inlet. No mediastinal lymphadenopathy. Unremarkable course of the thoracic esophagus. Lungs/Pleura: Mixed ground-glass and nodular opacity of the bilateral lungs, predominantly right-sided though involving all lobes. No endotracheal or endobronchial debris. Pattern of opacity predominantly within the distal peribronchovascular distribution. Trace left-sided pleural effusion. CT ABDOMEN PELVIS FINDINGS Hepatobiliary: Hepatomegaly, with the greatest cranial caudal diameter of the right liver measuring 24 cm. This 2 phase contrast-enhanced CT demonstrates no focal mass within the liver, however, in overall heterogeneous pattern of enhancement/ attenuation with a nodular contour. When compared to prior CT of 2016, the overall size measures slightly larger, with increase nodular contour. Cholelithiasis.  No evidence of inflammatory changes. Pancreas: Unremarkable pancreas Spleen: Cranial caudal span of spleen measures 14.4 cm Adrenals/Urinary Tract: Unremarkable appearance of the kidneys with no hydronephrosis or nephrolithiasis. No inflammatory changes. Unremarkable course of the bilateral ureters. Unremarkable urinary bladder. Stomach/Bowel: Unremarkable stomach. Unremarkable small bowel appear read Normal appendix. Colonic diverticulae without evidence of  acute inflammatory changes. Vascular/Lymphatic: Scattered calcifications of the abdominal aorta. No adenopathy. Reproductive: Unremarkable appearance of the uterus and the adnexa Other: Fat containing umbilical hernia. Musculoskeletal: Widespread metastatic disease of the axial and appendicular skeleton. All of the thoracic and lumbar vertebral bodies demonstrate sclerotic/blastic changes. Degenerative  changes are present. No acute pathologic fracture of the thoracic or lumbar levels. Diffuse sclerotic changes of the bilateral hemipelvis and the sacrum. Sclerotic changes of the sternum. Sclerotic changes of the visualized bilateral humerus, bilateral clavicles, and throughout all the bilateral ribs. Sclerotic changes of the proximal bilateral femurs. Surgical changes of left mastectomy. Surgical changes of the right axilla. IMPRESSION: Interval development of multifocal pneumonia. It is uncertain if the previously identified lung nodules on the CT 01/03/2017 represented early inflammatory/infectious changes, or if these represent pulmonary metastases. Attention on future imaging may be useful. Trace left pleural fluid. Widespread skeletal metastases throughout the visualized axial and appendicular skeleton. No pathologic fracture identified. 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 Splenomegaly Cholelithiasis without evidence of acute cholecystitis. Diverticular disease without evidence of acute diverticulitis. Fat containing umbilical hernia. Electronically Signed   By: Corrie Mckusick D.O.   On: 02/27/2017 09:12   Nm Bone Scan Whole Body  Result Date: 02/26/2017 CLINICAL DATA:  History of breast cancer with bony metastases. EXAM: NUCLEAR MEDICINE WHOLE BODY BONE SCAN TECHNIQUE: Whole body anterior and posterior images were obtained approximately 3 hours after intravenous injection of radiopharmaceutical. RADIOPHARMACEUTICALS:  21.0 mCi Technetium-75mMDP IV COMPARISON:  02/28/2016 FINDINGS: Normal soft tissue and renal uptake. Abnormal radiotracer uptake within the sternum, clavicles, bilateral humeral heads, bilateral proximal femurs, sacrum, bilateral medial ileal bones, numerous thoracic vertebral bodies, and numerous bilateral ribs. Abnormal  heterogeneous radiotracer uptake within the calvarium. Compared to the prior study the radiotracer activity within the sternum, bilateral ribs, and bilateral proximal femurs have increased. IMPRESSION: Evidence of progression of widespread skeletal metastatic disease, with increased activity within the sternum, bilateral ribs and bilateral proximal femurs. Electronically Signed   By: DFidela SalisburyM.D.   On: 02/26/2017 15:36   Ct Abdomen Pelvis W Contrast  Result Date: 02/27/2017 CLINICAL DATA:  56year old female with a history of metastatic breast cancer. EXAM: CT CHEST, ABDOMEN, AND PELVIS WITH CONTRAST TECHNIQUE: Multidetector CT imaging of the chest, abdomen and pelvis was performed following the standard protocol during bolus administration of intravenous contrast. CONTRAST:  1066mISOVUE-300 IOPAMIDOL (ISOVUE-300) INJECTION 61% COMPARISON:  Ultrasound 02/17/2017, CT 01/03/2017, 07/17/2016, 02/29/2016, 02/24/2015 FINDINGS: CT CHEST FINDINGS Cardiovascular: Heart size within normal limits. No pericardial fluid/ thickening. Minimal calcifications of the mitral annulus and in the distribution of the circumflex coronary artery. Unremarkable course caliber and contour of the thoracic aorta. Unremarkable diameter of the main pulmonary artery. Mediastinum/Nodes: Unremarkable appearance of the thoracic inlet. No mediastinal lymphadenopathy. Unremarkable course of the thoracic esophagus. Lungs/Pleura: Mixed ground-glass and nodular opacity of the bilateral lungs, predominantly right-sided though involving all lobes. No endotracheal or endobronchial debris. Pattern of opacity predominantly within the distal peribronchovascular distribution. Trace left-sided pleural effusion. CT ABDOMEN PELVIS FINDINGS Hepatobiliary: Hepatomegaly, with the greatest cranial caudal diameter of the right liver measuring 24 cm. This 2 phase contrast-enhanced CT demonstrates no focal mass within the liver, however, in overall  heterogeneous pattern of enhancement/ attenuation with a nodular contour. When compared to prior CT of 2016, the overall size measures slightly larger, with increase nodular contour. Cholelithiasis.  No evidence of inflammatory changes. Pancreas: Unremarkable pancreas Spleen: Cranial caudal span of spleen measures 14.4 cm Adrenals/Urinary Tract: Unremarkable appearance of the kidneys with no hydronephrosis or nephrolithiasis. No inflammatory changes. Unremarkable course of the bilateral ureters. Unremarkable urinary bladder. Stomach/Bowel: Unremarkable stomach. Unremarkable small bowel appear read Normal appendix. Colonic diverticulae without evidence of acute inflammatory changes. Vascular/Lymphatic: Scattered calcifications of the abdominal aorta. No adenopathy. Reproductive: Unremarkable appearance of the uterus and the adnexa Other: Fat containing umbilical hernia. Musculoskeletal: Widespread metastatic disease of the axial and appendicular skeleton. All of the thoracic and lumbar vertebral bodies demonstrate sclerotic/blastic changes. Degenerative changes are present. No acute pathologic fracture of the thoracic or lumbar levels. Diffuse sclerotic changes of the bilateral hemipelvis and the sacrum. Sclerotic changes of the sternum. Sclerotic changes of the visualized bilateral humerus, bilateral clavicles, and throughout all the bilateral ribs. Sclerotic changes of the proximal bilateral femurs. Surgical changes of left mastectomy. Surgical changes of the right axilla. IMPRESSION: Interval development of multifocal pneumonia. It is uncertain if the previously identified lung nodules on the CT 01/03/2017 represented early inflammatory/infectious changes, or if these represent pulmonary metastases. Attention on future imaging may be useful. Trace left pleural fluid. Widespread skeletal metastases throughout the visualized axial and appendicular skeleton. No pathologic fracture identified. 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 Splenomegaly Cholelithiasis without evidence of acute cholecystitis. Diverticular disease without evidence of acute diverticulitis. Fat containing umbilical hernia. Electronically Signed   By: Corrie Mckusick D.O.   On: 02/27/2017 09:12        Scheduled Meds: . irbesartan  300 mg Oral Daily   And  . amLODipine  10 mg Oral Daily  . iopamidol      . loratadine  10 mg Oral Daily  . protein supplement shake  11 oz Oral BID BM   Continuous Infusions: . sodium chloride 75 mL/hr at 02/25/17 0305  . sodium chloride    . sodium chloride    . levofloxacin (LEVAQUIN) IV Stopped (02/27/17 1431)     LOS: 3 days    Time spent: 25 minutes    Joelly Bolanos Jodie Echevaria, MD [facility] Pager [phone]  If 7PM-7AM, please contact night-coverage [email or web address] Password [facility] 02/27/2017, 2:48 PM

## 2017-02-28 ENCOUNTER — Ambulatory Visit (HOSPITAL_COMMUNITY): Payer: BC Managed Care – PPO

## 2017-02-28 ENCOUNTER — Encounter (HOSPITAL_COMMUNITY): Payer: Self-pay

## 2017-02-28 DIAGNOSIS — D72829 Elevated white blood cell count, unspecified: Secondary | ICD-10-CM

## 2017-02-28 DIAGNOSIS — D649 Anemia, unspecified: Secondary | ICD-10-CM

## 2017-02-28 DIAGNOSIS — K769 Liver disease, unspecified: Secondary | ICD-10-CM

## 2017-02-28 LAB — CBC WITH DIFFERENTIAL/PLATELET
BASOS ABS: 0 10*3/uL (ref 0.0–0.1)
Basophils Relative: 0 %
EOS ABS: 0.6 10*3/uL (ref 0.0–0.7)
EOS PCT: 4 %
HEMATOCRIT: 21.7 % — AB (ref 36.0–46.0)
HEMOGLOBIN: 6.8 g/dL — AB (ref 12.0–15.0)
Lymphocytes Relative: 14 %
Lymphs Abs: 2.2 10*3/uL (ref 0.7–4.0)
MCH: 33.2 pg (ref 26.0–34.0)
MCHC: 31.3 g/dL (ref 30.0–36.0)
MCV: 105.9 fL — ABNORMAL HIGH (ref 78.0–100.0)
MONOS PCT: 13 %
MYELOCYTES: 5 %
Monocytes Absolute: 2.1 10*3/uL — ABNORMAL HIGH (ref 0.1–1.0)
Neutro Abs: 10.8 10*3/uL — ABNORMAL HIGH (ref 1.7–7.7)
Neutrophils Relative %: 61 %
Other: 1 %
PROMYELOCYTES ABS: 2 %
Platelets: 28 10*3/uL — CL (ref 150–400)
RBC: 2.05 MIL/uL — AB (ref 3.87–5.11)
RDW: 23.4 % — AB (ref 11.5–15.5)
WBC: 15.9 10*3/uL — ABNORMAL HIGH (ref 4.0–10.5)
nRBC: 68 /100 WBC — ABNORMAL HIGH

## 2017-02-28 LAB — COMPREHENSIVE METABOLIC PANEL
ALBUMIN: 2.1 g/dL — AB (ref 3.5–5.0)
ALT: 55 U/L — ABNORMAL HIGH (ref 14–54)
AST: 93 U/L — AB (ref 15–41)
Alkaline Phosphatase: 343 U/L — ABNORMAL HIGH (ref 38–126)
Anion gap: 8 (ref 5–15)
BILIRUBIN TOTAL: 14.8 mg/dL — AB (ref 0.3–1.2)
BUN: 24 mg/dL — AB (ref 6–20)
CO2: 19 mmol/L — AB (ref 22–32)
Calcium: 7.7 mg/dL — ABNORMAL LOW (ref 8.9–10.3)
Chloride: 110 mmol/L (ref 101–111)
Creatinine, Ser: 0.93 mg/dL (ref 0.44–1.00)
GFR calc Af Amer: >60 mL/min (ref >60)
GFR calc non Af Amer: >60 mL/min (ref >60)
GLUCOSE: 138 mg/dL — AB (ref 65–99)
POTASSIUM: 4.3 mmol/L (ref 3.5–5.1)
Sodium: 137 mmol/L (ref 135–145)
TOTAL PROTEIN: 6 g/dL — AB (ref 6.5–8.1)

## 2017-02-28 LAB — PATHOLOGIST SMEAR REVIEW

## 2017-02-28 LAB — PREPARE RBC (CROSSMATCH)

## 2017-02-28 MED ORDER — PHENOL 1.4 % MT LIQD
1.0000 | OROMUCOSAL | Status: DC | PRN
  Filled 2017-02-28: qty 177

## 2017-02-28 MED ORDER — SODIUM CHLORIDE 0.9 % IV SOLN
Freq: Once | INTRAVENOUS | Status: AC
  Administered 2017-02-28: 19:00:00 via INTRAVENOUS

## 2017-02-28 MED ORDER — MENTHOL 3 MG MT LOZG
1.0000 | LOZENGE | OROMUCOSAL | Status: DC | PRN
  Administered 2017-02-28: 3 mg via ORAL
  Filled 2017-02-28: qty 9

## 2017-02-28 NOTE — Progress Notes (Signed)
CRITICAL VALUE ALERT  Critical Value:  Hgb: 6.8    PLT:28  Date & Time Notied: 02/28/17  0650  Provider Notified: [facility]  Orders Received/Actions taken:

## 2017-02-28 NOTE — Progress Notes (Signed)
Referring Physician(s): Dr. Jana Hakim  Supervising Physician: Marybelle Killings  Patient Status:  [facility] - In-pt  Chief Complaint: Liver lesion  Subjective: Denies pain or soreness.  Port currently accessed.   Allergies: Iodinated diagnostic agents and Penicillins  Medications: Prior to Admission medications   Medication Sig Start Date End Date Taking? Authorizing Provider  amLODipine-olmesartan (AZOR) 10-40 MG tablet Take 1 tablet by mouth daily. 05/24/16  Yes [name], [name], MD  Calcium Carb-Cholecalciferol (CALTRATE 600+D) 600-800 MG-UNIT TABS Take by mouth 2 (two) times daily. 01/18/15  Yes [name], [name], MD  Denosumab (XGEVA ) Inject into the skin.   Yes [provider]  levocetirizine (XYZAL) 5 MG tablet Take 5 mg by mouth every evening.   Yes [provider]  naproxen sodium (ALEVE) 220 MG tablet Take 220 mg by mouth as needed.   Yes [provider]  predniSONE (DELTASONE) 50 MG tablet Take 1 tablet at 13 hours, 7 hours and 1 hour before procedure. 02/19/17  Yes [name], [name], MD     Vital Signs: BP (!) 148/60   Pulse 99   Temp 97.6 F (36.4 C) (Oral)   Resp 18   Ht _0  (1.702 m)   Wt (!) 332 lb 3.7 oz (150.7 kg)   SpO2 93%   BMI 52.03 kg/m   Physical Exam  NAD, alert Skin:  Port-A-Cath in place. Currently accessed.  No erythema or warmth Abdomen: Soft, non-tender.  Puncture site intact.  Imaging: Ct Head W & Wo Contrast  Result Date: 02/27/2017 CLINICAL DATA:  56 year old female. Metastatic breast cancer staging. EXAM: CT HEAD WITHOUT AND WITH CONTRAST TECHNIQUE: Contiguous axial images were obtained from the base of the skull through the vertex without and with intravenous contrast CONTRAST:  13 hour steroid premedication. Contrast tolerated with no adverse events. 169m ISOVUE-300 IOPAMIDOL (ISOVUE-300) INJECTION 61% in conjunction with contrast enhanced imaging of the chest, abdomen, and pelvis reported separately.  COMPARISON:  Skull radiographs 06/12/2016. FINDINGS: Brain: Partially empty sella. Cerebral volume within normal limits. No midline shift, ventriculomegaly, mass effect, evidence of mass lesion, intracranial hemorrhage or evidence of cortically based acute infarction. Gray-white matter differentiation is within normal limits throughout the brain. No abnormal enhancement identified.  No encephalomalacia identified. Vascular: The major intracranial vascular structures appear to be enhancing and patent. Skull: Widespread sclerotic osseous metastatic disease. Discrete sclerotic lesions most apparent at the right lower clivus, and in both frontal bones (e.g. Series 14, image 17). No destructive osseous lesion identified. Sinuses/Orbits: Paranasal sinuses are well pneumatized. Left greater than right mastoid effusions. Tympanic cavities remain clear. Other: Leftward gaze deviation. Otherwise negative visible orbit and scalp soft tissues. IMPRESSION: 1. Diffuse sclerotic metastatic disease throughout the skull and visible facial bones. No destructive osseous lesion identified. 2. No metastatic disease to the brain identified; negative CT appearance of the brain. Electronically Signed   By: HGenevie AnnM.D.   On: 02/27/2017 08:58   Ct Chest W Contrast  Result Date: 02/27/2017 CLINICAL DATA:  56year old female with a history of metastatic breast cancer. EXAM: CT CHEST, ABDOMEN, AND PELVIS WITH CONTRAST TECHNIQUE: Multidetector CT imaging of the chest, abdomen and pelvis was performed following the standard protocol during bolus administration of intravenous contrast. CONTRAST:  1053mISOVUE-300 IOPAMIDOL (ISOVUE-300) INJECTION 61% COMPARISON:  Ultrasound 02/17/2017, CT 01/03/2017, 07/17/2016, 02/29/2016, 02/24/2015 FINDINGS: CT CHEST FINDINGS Cardiovascular: Heart size within normal limits. No pericardial fluid/ thickening. Minimal calcifications of the mitral annulus and in the distribution of the  circumflex coronary  artery. Unremarkable course caliber and contour of the thoracic aorta. Unremarkable diameter of the main pulmonary artery. Mediastinum/Nodes: Unremarkable appearance of the thoracic inlet. No mediastinal lymphadenopathy. Unremarkable course of the thoracic esophagus. Lungs/Pleura: Mixed ground-glass and nodular opacity of the bilateral lungs, predominantly right-sided though involving all lobes. No endotracheal or endobronchial debris. Pattern of opacity predominantly within the distal peribronchovascular distribution. Trace left-sided pleural effusion. CT ABDOMEN PELVIS FINDINGS Hepatobiliary: Hepatomegaly, with the greatest cranial caudal diameter of the right liver measuring 24 cm. This 2 phase contrast-enhanced CT demonstrates no focal mass within the liver, however, in overall heterogeneous pattern of enhancement/ attenuation with a nodular contour. When compared to prior CT of 2016, the overall size measures slightly larger, with increase nodular contour. Cholelithiasis.  No evidence of inflammatory changes. Pancreas: Unremarkable pancreas Spleen: Cranial caudal span of spleen measures 14.4 cm Adrenals/Urinary Tract: Unremarkable appearance of the kidneys with no hydronephrosis or nephrolithiasis. No inflammatory changes. Unremarkable course of the bilateral ureters. Unremarkable urinary bladder. Stomach/Bowel: Unremarkable stomach. Unremarkable small bowel appear read Normal appendix. Colonic diverticulae without evidence of acute inflammatory changes. Vascular/Lymphatic: Scattered calcifications of the abdominal aorta. No adenopathy. Reproductive: Unremarkable appearance of the uterus and the adnexa Other: Fat containing umbilical hernia. Musculoskeletal: Widespread metastatic disease of the axial and appendicular skeleton. All of the thoracic and lumbar vertebral bodies demonstrate sclerotic/blastic changes. Degenerative changes are present. No acute pathologic fracture of the thoracic or lumbar levels.  Diffuse sclerotic changes of the bilateral hemipelvis and the sacrum. Sclerotic changes of the sternum. Sclerotic changes of the visualized bilateral humerus, bilateral clavicles, and throughout all the bilateral ribs. Sclerotic changes of the proximal bilateral femurs. Surgical changes of left mastectomy. Surgical changes of the right axilla. IMPRESSION: Interval development of multifocal pneumonia. It is uncertain if the previously identified lung nodules on the CT 01/03/2017 represented early inflammatory/infectious changes, or if these represent pulmonary metastases. Attention on future imaging may be useful. Trace left pleural fluid. Widespread skeletal metastases throughout the visualized axial and appendicular skeleton. No pathologic fracture identified. 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 Splenomegaly Cholelithiasis without evidence of acute cholecystitis. Diverticular disease without evidence of acute diverticulitis. Fat containing umbilical hernia. Electronically Signed   By: Corrie Mckusick D.O.   On: 02/27/2017 09:12   Nm Bone Scan Whole Body  Result Date: 02/26/2017 CLINICAL DATA:  History of breast cancer with bony metastases. EXAM: NUCLEAR MEDICINE WHOLE BODY BONE SCAN TECHNIQUE: Whole body anterior and posterior images were obtained approximately 3 hours after intravenous injection of radiopharmaceutical. RADIOPHARMACEUTICALS:  21.0 mCi Technetium-25mMDP IV COMPARISON:  02/28/2016 FINDINGS: Normal soft tissue and renal uptake. Abnormal radiotracer uptake within the sternum, clavicles, bilateral humeral heads, bilateral proximal femurs, sacrum, bilateral medial ileal bones, numerous thoracic vertebral bodies, and numerous bilateral ribs. Abnormal heterogeneous radiotracer uptake within the calvarium. Compared to the prior study  the radiotracer activity within the sternum, bilateral ribs, and bilateral proximal femurs have increased. IMPRESSION: Evidence of progression of widespread skeletal metastatic disease, with increased activity within the sternum, bilateral ribs and bilateral proximal femurs. Electronically Signed   By: DFidela SalisburyM.D.   On: 02/26/2017 15:36   Ct Abdomen Pelvis W Contrast  Result Date: 02/27/2017 CLINICAL DATA:  56year old female with a history of metastatic breast cancer. EXAM: CT CHEST, ABDOMEN, AND PELVIS WITH CONTRAST TECHNIQUE: Multidetector CT imaging of the chest, abdomen  and pelvis was performed following the standard protocol during bolus administration of intravenous contrast. CONTRAST:  168m ISOVUE-300 IOPAMIDOL (ISOVUE-300) INJECTION 61% COMPARISON:  Ultrasound 02/17/2017, CT 01/03/2017, 07/17/2016, 02/29/2016, 02/24/2015 FINDINGS: CT CHEST FINDINGS Cardiovascular: Heart size within normal limits. No pericardial fluid/ thickening. Minimal calcifications of the mitral annulus and in the distribution of the circumflex coronary artery. Unremarkable course caliber and contour of the thoracic aorta. Unremarkable diameter of the main pulmonary artery. Mediastinum/Nodes: Unremarkable appearance of the thoracic inlet. No mediastinal lymphadenopathy. Unremarkable course of the thoracic esophagus. Lungs/Pleura: Mixed ground-glass and nodular opacity of the bilateral lungs, predominantly right-sided though involving all lobes. No endotracheal or endobronchial debris. Pattern of opacity predominantly within the distal peribronchovascular distribution. Trace left-sided pleural effusion. CT ABDOMEN PELVIS FINDINGS Hepatobiliary: Hepatomegaly, with the greatest cranial caudal diameter of the right liver measuring 24 cm. This 2 phase contrast-enhanced CT demonstrates no focal mass within the liver, however, in overall heterogeneous pattern of enhancement/ attenuation with a nodular contour. When compared  to prior CT of 2016, the overall size measures slightly larger, with increase nodular contour. Cholelithiasis.  No evidence of inflammatory changes. Pancreas: Unremarkable pancreas Spleen: Cranial caudal span of spleen measures 14.4 cm Adrenals/Urinary Tract: Unremarkable appearance of the kidneys with no hydronephrosis or nephrolithiasis. No inflammatory changes. Unremarkable course of the bilateral ureters. Unremarkable urinary bladder. Stomach/Bowel: Unremarkable stomach. Unremarkable small bowel appear read Normal appendix. Colonic diverticulae without evidence of acute inflammatory changes. Vascular/Lymphatic: Scattered calcifications of the abdominal aorta. No adenopathy. Reproductive: Unremarkable appearance of the uterus and the adnexa Other: Fat containing umbilical hernia. Musculoskeletal: Widespread metastatic disease of the axial and appendicular skeleton. All of the thoracic and lumbar vertebral bodies demonstrate sclerotic/blastic changes. Degenerative changes are present. No acute pathologic fracture of the thoracic or lumbar levels. Diffuse sclerotic changes of the bilateral hemipelvis and the sacrum. Sclerotic changes of the sternum. Sclerotic changes of the visualized bilateral humerus, bilateral clavicles, and throughout all the bilateral ribs. Sclerotic changes of the proximal bilateral femurs. Surgical changes of left mastectomy. Surgical changes of the right axilla. IMPRESSION: Interval development of multifocal pneumonia. It is uncertain if the previously identified lung nodules on the CT 01/03/2017 represented early inflammatory/infectious changes, or if these represent pulmonary metastases. Attention on future imaging may be useful. Trace left pleural fluid. Widespread skeletal metastases throughout the visualized axial and appendicular skeleton. No pathologic fracture identified. 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 Splenomegaly Cholelithiasis without evidence of acute cholecystitis. Diverticular disease without evidence of acute diverticulitis. Fat containing umbilical hernia. Electronically Signed   By: JCorrie MckusickD.O.   On: 02/27/2017 09:12   Ir UK[place]Guide Vasc Access Right  Result Date: 02/27/2017 INDICATION: History of metastatic breast cancer, post left-sided mastectomy. In need of durable intravenous access for chemotherapy administration. EXAM: IMPLANTED PORT A CATH PLACEMENT WITH ULTRASOUND AND FLUOROSCOPIC GUIDANCE COMPARISON:  Chest CT - 02/27/2017 MEDICATIONS: Patient is currently admitted to the hospital and receiving intravenous antibiotics.; The antibiotic was administered within an appropriate time interval prior to skin puncture. ANESTHESIA/SEDATION: Moderate (conscious) sedation was employed during this procedure. A total of Versed 4 mg and Fentanyl 150 mcg was administered intravenously. Moderate Sedation Time: 27 minutes. The patient's level of consciousness and vital signs were monitored continuously by radiology nursing throughout the procedure under my direct supervision. CONTRAST:  None FLUOROSCOPY TIME:  18 seconds (10 mGy) COMPLICATIONS: None  immediate. PROCEDURE: The procedure, risks, benefits, and alternatives were explained to the patient. Questions regarding the procedure were encouraged and answered. The patient understands and consents to the procedure. The right neck and chest were prepped with chlorhexidine in a sterile fashion, and a sterile drape was applied covering the operative field. Maximum barrier sterile technique with sterile gowns and gloves were used for the procedure. A timeout was performed prior to the initiation of the procedure. Local anesthesia was provided with 1% lidocaine with epinephrine. After creating a small venotomy incision, a micropuncture kit was utilized to access  the internal jugular vein. Real-time ultrasound guidance was utilized for vascular access including the acquisition of a permanent ultrasound image documenting patency of the accessed vessel. The microwire was utilized to measure appropriate catheter length. A subcutaneous port pocket was then created along the upper chest wall utilizing a combination of sharp and blunt dissection. The pocket was irrigated with sterile saline. A single lumen power injectable port was chosen for placement. The 8 Fr catheter was tunneled from the port pocket site to the venotomy incision. The port was placed in the pocket. The external catheter was trimmed to appropriate length. At the venotomy, an 8 Fr peel-away sheath was placed over a guidewire under fluoroscopic guidance. The catheter was then placed through the sheath and the sheath was removed. Final catheter positioning was confirmed and documented with a fluoroscopic spot radiograph. The port was accessed with a Huber needle, aspirated and flushed with heparinized saline. The venotomy site was closed with an interrupted 4-0 Vicryl suture. The port pocket incision was closed with interrupted 2-0 Vicryl suture and the skin was opposed with a running subcuticular 4-0 Vicryl suture. Dermabond and Steri-strips were applied to both incisions. Dressings were placed. The patient tolerated the procedure well without immediate post procedural complication. FINDINGS: After catheter placement, the tip lies within the superior cavoatrial junction. The catheter aspirates and flushes normally and is ready for immediate use. IMPRESSION: Successful placement of a right internal jugular approach power injectable Port-A-Cath. The catheter is ready for immediate use. Electronically Signed   By: Sandi Mariscal M.D.   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 Please perform ultrasound-guided liver lesion biopsy for tissue diagnostic purposes. EXAM: ULTRASOUND GUIDED LIVER LESION BIOPSY COMPARISON:  CT the chest, abdomen and pelvis - 02/27/2017 MEDICATIONS: None ANESTHESIA/SEDATION: Fentanyl 100 mcg IV; Versed 2 mg IV Total Moderate Sedation time: 15 minutes. The patient's level of consciousness and vital signs were monitored continuously by radiology nursing throughout the procedure under my direct supervision. COMPLICATIONS: None immediate. PROCEDURE: Informed written consent was obtained from the patient after a discussion of the risks, benefits and alternatives to treatment. The patient understands and consents the procedure. A timeout was performed prior to the initiation of the procedure. Ultrasound scanning was performed of the right upper abdominal quadrant demonstrates marked heterogeneous appearance of the hepatic parenchyma with nodular contour compatible with findings of cirrhosis demonstrated preceding abdominal CT. No was made of an approximately 1.3 x 1.4 cm hypoechoic lesion within the right lobe of the liver potentially correlating with the ill-defined hypoattenuating lesions seen on preceding abdominal CT image 58, series 7. This lesion as well as the surrounding hepatic parenchyma was targeted for biopsy for tissue diagnostic purposes. The procedure was planned. The right upper abdominal quadrant was prepped  and draped in the usual sterile fashion. The overlying soft tissues were anesthetized with 1% lidocaine with epinephrine. A 17 gauge, 6.8 cm co-axial needle was advanced into a peripheral aspect of the lesion. This was followed by 5 core biopsies with an 18 gauge core device under direct ultrasound guidance. The coaxial needle tract was embolized with a small amount of Gel-Foam slurry and superficial hemostasis was obtained with manual compression. Post procedural scanning was negative for definitive area of  hemorrhage or additional complication. A dressing was placed. The patient tolerated the procedure well without immediate post procedural complication. IMPRESSION: Technically successful ultrasound guided core needle biopsy of indeterminate lesion within the right lobe of the liver. Electronically Signed   By: Sandi Mariscal M.D.   On: 02/27/2017 17:13   Ir Fluoro Guide Port Insertion Right  Result Date: 02/27/2017 INDICATION: History of metastatic breast cancer, post left-sided mastectomy. In need of durable intravenous access for chemotherapy administration. EXAM: IMPLANTED PORT A CATH PLACEMENT WITH ULTRASOUND AND FLUOROSCOPIC GUIDANCE COMPARISON:  Chest CT - 02/27/2017 MEDICATIONS: Patient is currently admitted to the hospital and receiving intravenous antibiotics.; The antibiotic was administered within an appropriate time interval prior to skin puncture. ANESTHESIA/SEDATION: Moderate (conscious) sedation was employed during this procedure. A total of Versed 4 mg and Fentanyl 150 mcg was administered intravenously. Moderate Sedation Time: 27 minutes. The patient's level of consciousness and vital signs were monitored continuously by radiology nursing throughout the procedure under my direct supervision. CONTRAST:  None FLUOROSCOPY TIME:  18 seconds (10 mGy) COMPLICATIONS: None immediate. PROCEDURE: The procedure, risks, benefits, and alternatives were explained to the patient. Questions regarding the procedure were encouraged and answered. The patient understands and consents to the procedure. The right neck and chest were prepped with chlorhexidine in a sterile fashion, and a sterile drape was applied covering the operative field. Maximum barrier sterile technique with sterile gowns and gloves were used for the procedure. A timeout was performed prior to the initiation of the procedure. Local anesthesia was provided with 1% lidocaine with epinephrine. After creating a small venotomy incision, a micropuncture  kit was utilized to access the internal jugular vein. Real-time ultrasound guidance was utilized for vascular access including the acquisition of a permanent ultrasound image documenting patency of the accessed vessel. The microwire was utilized to measure appropriate catheter length. A subcutaneous port pocket was then created along the upper chest wall utilizing a combination of sharp and blunt dissection. The pocket was irrigated with sterile saline. A single lumen power injectable port was chosen for placement. The 8 Fr catheter was tunneled from the port pocket site to the venotomy incision. The port was placed in the pocket. The external catheter was trimmed to appropriate length. At the venotomy, an 8 Fr peel-away sheath was placed over a guidewire under fluoroscopic guidance. The catheter was then placed through the sheath and the sheath was removed. Final catheter positioning was confirmed and documented with a fluoroscopic spot radiograph. The port was accessed with a Huber needle, aspirated and flushed with heparinized saline. The venotomy site was closed with an interrupted 4-0 Vicryl suture. The port pocket incision was closed with interrupted 2-0 Vicryl suture and the skin was opposed with a running subcuticular 4-0 Vicryl suture. Dermabond and Steri-strips were applied to both incisions. Dressings were placed. The patient tolerated the procedure well without immediate post procedural complication. FINDINGS: After catheter placement, the tip lies within the superior cavoatrial junction. The catheter aspirates and flushes normally and is ready for  immediate use. IMPRESSION: Successful placement of a right internal jugular approach power injectable Port-A-Cath. The catheter is ready for immediate use. Electronically Signed   By: Sandi Mariscal M.D.   On: 02/27/2017 17:09    Labs:  CBC: Recent Labs    02/25/17 0522 02/26/17 0358 02/27/17 0540 02/28/17 0610  WBC 11.4* 14.4* 18.1* 15.9*  HGB 7.5*  7.3* 7.8* 6.8*  HCT 24.7* 23.2* 25.4* 21.7*  PLT 16* 49* 53* 28*    COAGS: Recent Labs    02/24/17 1049 02/27/17 1055  INR 2.20 1.22    BMP: Recent Labs    02/24/17 1159 02/25/17 0522 02/26/17 0358 02/28/17 0610  NA 138 137 136 137  K 4.9 4.7 4.6 4.3  CL  --  110 110 110  CO2 18* 20* 19* 19*  GLUCOSE 116 94 98 138*  BUN 21._0 24*  CALCIUM 8.7 8.2* 8.1* 7.7*  CREATININE 1.2* 0.90 1.10* 0.93  GFRNONAA  --  >60 55* >60  GFRAA  --  >60 >60 >60    LIVER FUNCTION TESTS: Recent Labs    02/14/17 1515 02/24/17 1159 02/25/17 0522 02/28/17 0610  BILITOT 5.29* 11.98* 11.9* 14.8*  AST 94* 107* 108* 93*  ALT 53 59* 54 55*  ALKPHOS 337* 467* 366* 343*  PROT 7.1 7.1 6.1* 6.0*  ALBUMIN 2.4* 2.3* 2.1* 2.1*    Assessment and Plan: Metastatic breast cancer Patient s/p liver lesion biopsy and Port-A-Cath placement.  Sites assessed today.  Port-A-Cath functioning well.  Liver puncture site intact.  No erythema or warmth.  IR available if needed.  Electronically Signed: Docia Barrier, PA 02/28/2017, 2:58 PM   I spent a total of 15 Minutes at the the patient's bedside AND on the patient's hospital floor or unit, greater than 50% of which was counseling/coordinating care for liver lesion.

## 2017-02-28 NOTE — Progress Notes (Signed)
Daily Progress Note   Patient Name: Danielle Oliver       Date: 02/28/2017 DOB: 1960/03/25  Age: 56 y.o. MRN#: [id number] Attending Physician: Marene Lenz, MD Primary Care Physician: Helane Rima, MD Admit Date: 02/24/2017  Reason for Consultation/Follow-up: Establishing goals of care  Subjective: Danielle Oliver is sitting up in bed. Feeling much better today.   Length of Stay: 4  Current Medications: Scheduled Meds:  . irbesartan  300 mg Oral Daily   And  . amLODipine  10 mg Oral Daily  . loratadine  10 mg Oral Daily  . protein supplement shake  11 oz Oral BID BM    Continuous Infusions: . sodium chloride 75 mL/hr at 02/28/17 0739  . sodium chloride    . sodium chloride    . levofloxacin (LEVAQUIN) IV Stopped (02/27/17 1431)    PRN Meds: diphenhydrAMINE, ibuprofen, lip balm, ondansetron (ZOFRAN) IV, oxyCODONE  Physical Exam         Constitutional: She is oriented to person, place, and time. She appears well-developed.  Obese   HENT:  Head: Normocephalic and atraumatic.  Cardiovascular: Normal rate.  Pulmonary/Chest: Effort normal. No accessory muscle usage. No tachypnea. No respiratory distress.  Abdominal: Soft. Normal appearance.  Neurological: She is alert and oriented to person, place, and time.  Nursing note and vitals reviewed.   Vital Signs: BP 133/64   Pulse 92   Temp 97.6 F (36.4 C) (Oral)   Resp 18   Ht _0  (1.702 m)   Wt (!) 150.7 kg (332 lb 3.7 oz)   SpO2 93%   BMI 52.03 kg/m  SpO2: SpO2: 93 % O2 Device: O2 Device: Not Delivered O2 Flow Rate: O2 Flow Rate (L/min): 2 L/min  Intake/output summary:  No intake or output data in the 24 hours ending 02/28/17 1108 LBM: Last BM Date: 02/27/17 Baseline Weight: Weight: (!) 150.7 kg (332 lb 3.7  oz) Most recent weight: Weight: (!) 150.7 kg (332 lb 3.7 oz)       Palliative Assessment/Data: 30%    Flowsheet Rows     Most Recent Value  Intake Tab  Referral Department  Hospitalist  Unit at Time of Referral  Med/Surg Unit  Palliative Care Primary Diagnosis  Cancer  Date Notified  02/25/17  Palliative Care Type  New Palliative care  Reason for referral  Clarify Goals of Care  Date of Admission  02/24/17  Date first seen by Palliative Care  02/26/17  # of days Palliative referral response time  1 Day(s)  # of days IP prior to Palliative referral  1  Clinical Assessment  Psychosocial & Spiritual Assessment  Palliative Care Outcomes      Patient Active Problem List   Diagnosis Date Noted  . Goals of care, counseling/discussion   . Palliative care encounter   . Generalized weakness 02/25/2017  . Hypertension complicating diabetes ([place]) 02/25/2017  . Metastatic breast cancer ([place]) 02/24/2017  . Metastases to the liver ([place]) 02/19/2017  . Thrombocytopenia ([place]) 02/19/2017  . Breast cancer metastasized to bone ([place]) 11/14/2014  . Malignant neoplasm of upper-outer quadrant of right breast in female, estrogen receptor positive ([place]) 01/14/2014  . BP (high blood pressure) 03/30/2012    Palliative Care Assessment & Plan   HPI: 56 y.o. female  with past medical history of metastatic breast cancer to liver and bone, HTN, SOB with dyspnea, pneumonia admitted on 02/24/2017 with weakness, fatigue, poor intake, thrombocytopenia. Palliative care requested for [place] discussions.   Assessment: I met again today with Danielle Oliver. She finally was able to rest well last night and feels much better today. She was hoping to go home today but Hgb is down to 6.8 and PLT down to 28. She was very anxious about her procedures yesterday but said that they went very well and she did not experience the discomfort as she had with her breast biopsy. She is in very good spirits today.   She also says that  her daughters father will be here soon to assist her and she was able to reconnect with her son that lives locally yesterday who will be helping her more as well. This reconnection with her son is a huge weight off her shoulders. She is anticipating home soon. Emotional support provided.   Recommendations/Plan:  Denies pain/discomfort.   Feeling much better today.   Goals of Care and Additional Recommendations:  Limitations on Scope of Treatment: Full Scope Treatment  Code Status:  Full code  Prognosis:   Unable to determine  Discharge Planning:  Home with Home Health  Thank you for allowing the Palliative Medicine Team to assist in the care of this patient.   Total Time 15 min Prolonged Time Billed  no       Greater than 50%  of this time was spent counseling and coordinating care related to the above assessment and plan.  Vinie Sill, NP Palliative Medicine Team Pager # [phone] (M-F 8a-5p) Team Phone # [phone] (Nights/Weekends)

## 2017-02-28 NOTE — Progress Notes (Signed)
PROGRESS NOTE    Danielle Oliver  OEH:[id number] DOB: 1960-06-25 DOA: 02/24/2017 PCP: Helane Rima, MD   Brief Narrative:  Patient is a 56 year old African-American female with past medical history of metastatic breast cancer with metastases to liver, bone and lung, morbid obesity who was sent here with a direct admission from [facility]/oncology office.  Patient reported of generalized weakness, fatigue on minimal exertion and 30 pound weight loss in the last month. Patient was found to be anemic, thrombocytopenic and with elevation of bilirubin,  INR and liver enzymes.  Assessment & Plan:   Active Problems:   BP (high blood pressure)   Metastases to the liver (HCC)   Thrombocytopenia (HCC)   Metastatic breast cancer (HCC)   Generalized weakness   Goals of care, counseling/discussion   Palliative care encounter   Leukocytosis   Low hemoglobin   Metastatic breast cancer: With metastases to liver, bone .Likely lungs also Oncology following.  She will be followed by oncology as an outpatient. Palliative consult also following. S/P liver biopsy and PAC placement here by IR. Patient follows with Dr. Jana Hakim. She was diagnosed with a stage III left breast cancer in 48 when she was 56 years old.  Apparently she had a left mastectomy and 4 cycles of adjuvant chemotherapy at that time. She underwent diagnostic mammogram on 01/12/2014 which showed a 3.6 cm lobulated solid mass in the right upper quadrant of the right breast.  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  Her right axilla lymph node biopsy also showed invasive adenocarcinoma.  She underwent right lumpectomy on 02/17/15. CT scan on 03/18/14 showed multiple sclerotic bone metastases but no visceral disease. She was started on fulvestrant on 12/20/16 but discontinued due to disease progression. At present, cancer  has  metastasized to the liver and bone.  Ultrasound of the liver done on 02/17/17 showed innumerable small hypoechoic liver masses the largest measuring 1.8 cm on the left and two-point centimeter on the right.  On this admission: Nuclear bone scan :Evidence of progression of widespread skeletal metastatic disease, with increased activity within the sternum, bilateral ribs and bilateral proximal femurs  CT head :Does not show any brain metastasis but diffuse sclerotic metastatic disease throughout the skull and visible facial bones. No destructive osseous lesion identified  CT abdomen/pelvis/chest:Mixed ground-glass and nodular opacity of the bilateral lungs, predominantly right-sided though involving all lobes.  Multifocal pneumonia versus metastatic disease.Likely this is metastatic disease.  Patient has been started on Levaquin.  She is febrile and her lungs are clear at present. Also showed hepatic metastatic disease.  Patient will be followed by oncology as an outpatient she has an appointment on 03/07/17.  Elevated liver enzymes:  Secondary to liver metastases.S/P liver biopsy  Failure to thrive: Reported weight loss of 30 pounds last month.  Nutrition consulted.  Anemia/severe thrombocytopenia: Most likely secondary to liver metastasis.  Platelets count 16,000 on admission.  S/P 4 units of platelets with improvement to 53000 so she underwent liver biopsy.  Platelets comes down to 28,000 today.  I do not think she needs transfusion unless her platelets counts falls below 10,000 or she bleeds. Hemoglobin dropped to 6.8 today.  She will be transfused with 2 units of PRBC.  No evidence of GI bleed.  Most likely this is associated with her malignancy.  Generalized weakness/volume depletion: We will continue gentle IV fluids.  Hypertension: We will resume  her home medications.  Currently blood pressure stable.  We will continue to monitor.  Headache /Earache: Continue supportive care.Much  improved.   DVT prophylaxis: SCD Code Status: Full Family Communication: None present at bed side Disposition Plan: Home tomorrow   Consultants: Oncology,Palliative  Procedures:None  Antimicrobials:None  Subjective: Patient seen and examined at the bedside this morning.  Appears much better.  Feels comfortable.  Denies any complaints.  Willing to go home but seems to be transfused with 2 units of PRBC today.  Objective: Vitals:   02/27/17 1800 02/27/17 1830 02/27/17 1910 02/28/17 1013  BP: 120/68 119/61 132/64 133/64  Pulse: 86 86 85 92  Resp: '20  18 18  ' Temp: (!) 97.5 F (36.4 C) (!) 97.5 F (36.4 C) (!) 97.5 F (36.4 C) 97.6 F (36.4 C)  TempSrc: Oral Oral Oral Oral  SpO2: 99% 99% 99% 93%  Weight:      Height:       No intake or output data in the 24 hours ending 02/28/17 1337 Filed Weights   02/24/17 1609 02/24/17 1651  Weight: (!) 150.7 kg (332 lb 3.7 oz) (!) 150.7 kg (332 lb 3.7 oz)    Examination:   General exam: Appears calm and comfortable ,Not in distress,Morbidly obese Respiratory system: Bilateral equal air entry, normal vesicular breath sounds, no wheezes or crackles  Cardiovascular system: S1 & S2 heard, RRR. No JVD, murmurs, rubs, gallops or clicks. No pedal edema. Gastrointestinal system: Abdomen is nondistended, soft and nontender. No organomegaly or masses felt. Normal bowel sounds heard. Central nervous system: Alert and oriented. No focal neurological deficits. Extremities: No edema, no clubbing ,no cyanosis, distal peripheral pulses palpable. Skin: No cyanosis,No pallor,No Rash,No Ulcer Psychiatry: Judgement and insight appear normal. Mood & affect appropriate.     Data Reviewed: I have personally reviewed following labs and imaging studies  CBC: Recent Labs  Lab 02/24/17 1049 02/25/17 0522 02/26/17 0358 02/27/17 0540 02/28/17 0610  WBC 10.8* 11.4* 14.4* 18.1* 15.9*  NEUTROABS 6.6*  --  8.5* 11.4* 10.8*  HGB 9.3* 7.5* 7.3* 7.8*  6.8*  HCT 31.0* 24.7* 23.2* 25.4* 21.7*  MCV 106.5* 105.6* 106.4* 105.4* 105.9*  PLT 14* 16* 49* 53* 28*   Basic Metabolic Panel: Recent Labs  Lab 02/24/17 1159 02/24/17 1754 02/25/17 0522 02/26/17 0358 02/28/17 0610  NA 138  --  137 136 137  K 4.9  --  4.7 4.6 4.3  CL  --   --  110 110 110  CO2 18*  --  20* 19* 19*  GLUCOSE 116  --  94 98 138*  BUN 21.4  --  19 19 24*  CREATININE 1.2*  --  0.90 1.10* 0.93  CALCIUM 8.7  --  8.2* 8.1* 7.7*  MG  --  2.0  --   --   --   PHOS  --  2.6  --   --   --    GFR: Estimated Creatinine Clearance: 104.9 mL/min (by C-G formula based on SCr of 0.93 mg/dL). Liver Function Tests: Recent Labs  Lab 02/24/17 1159 02/25/17 0522 02/28/17 0610  AST 107* 108* 93*  ALT 59* 54 55*  ALKPHOS 467* 366* 343*  BILITOT 11.98* 11.9* 14.8*  PROT 7.1 6.1* 6.0*  ALBUMIN 2.3* 2.1* 2.1*   No results for input(s): LIPASE, AMYLASE in the last 168 hours. No results for input(s): AMMONIA in the last 168 hours. Coagulation Profile: Recent Labs  Lab 02/24/17 1049 02/27/17 1055  INR 2.20 1.22  PROTIME 26.4*  --    Cardiac Enzymes: No results for input(s): CKTOTAL, CKMB, CKMBINDEX, TROPONINI in the last 168 hours. BNP (last 3 results) No results for input(s): PROBNP in the last 8760 hours. HbA1C: No results for input(s): HGBA1C in the last 72 hours. CBG: No results for input(s): GLUCAP in the last 168 hours. Lipid Profile: No results for input(s): CHOL, HDL, LDLCALC, TRIG, CHOLHDL, LDLDIRECT in the last 72 hours. Thyroid Function Tests: No results for input(s): TSH, T4TOTAL, FREET4, T3FREE, THYROIDAB in the last 72 hours. Anemia Panel: No results for input(s): VITAMINB12, FOLATE, FERRITIN, TIBC, IRON, RETICCTPCT in the last 72 hours. Sepsis Labs: No results for input(s): PROCALCITON, LATICACIDVEN in the last 168 hours.  Recent Results (from the past 240 hour(s))  TECHNOLOGIST REVIEW     Status: None   Collection Time: 02/24/17 10:49 AM  Result  Value Ref Range Status   Technologist Review Metas and Myelocytes,2% blasts, 26% nrbcs present  Final         Radiology Studies: Ct Head W & Wo Contrast  Result Date: 02/27/2017 CLINICAL DATA:  56 year old female. Metastatic breast cancer staging. EXAM: CT HEAD WITHOUT AND WITH CONTRAST TECHNIQUE: Contiguous axial images were obtained from the base of the skull through the vertex without and with intravenous contrast CONTRAST:  13 hour steroid premedication. Contrast tolerated with no adverse events. 132m ISOVUE-300 IOPAMIDOL (ISOVUE-300) INJECTION 61% in conjunction with contrast enhanced imaging of the chest, abdomen, and pelvis reported separately. COMPARISON:  Skull radiographs 06/12/2016. FINDINGS: Brain: Partially empty sella. Cerebral volume within normal limits. No midline shift, ventriculomegaly, mass effect, evidence of mass lesion, intracranial hemorrhage or evidence of cortically based acute infarction. Gray-white matter differentiation is within normal limits throughout the brain. No abnormal enhancement identified.  No encephalomalacia identified. Vascular: The major intracranial vascular structures appear to be enhancing and patent. Skull: Widespread sclerotic osseous metastatic disease. Discrete sclerotic lesions most apparent at the right lower clivus, and in both frontal bones (e.g. Series 14, image 17). No destructive osseous lesion identified. Sinuses/Orbits: Paranasal sinuses are well pneumatized. Left greater than right mastoid effusions. Tympanic cavities remain clear. Other: Leftward gaze deviation. Otherwise negative visible orbit and scalp soft tissues. IMPRESSION: 1. Diffuse sclerotic metastatic disease throughout the skull and visible facial bones. No destructive osseous lesion identified. 2. No metastatic disease to the brain identified; negative CT appearance of the brain. Electronically Signed   By: HGenevie AnnM.D.   On: 02/27/2017 08:58   Ct Chest W Contrast  Result  Date: 02/27/2017 CLINICAL DATA:  56year old female with a history of metastatic breast cancer. EXAM: CT CHEST, ABDOMEN, AND PELVIS WITH CONTRAST TECHNIQUE: Multidetector CT imaging of the chest, abdomen and pelvis was performed following the standard protocol during bolus administration of intravenous contrast. CONTRAST:  1066mISOVUE-300 IOPAMIDOL (ISOVUE-300) INJECTION 61% COMPARISON:  Ultrasound 02/17/2017, CT 01/03/2017, 07/17/2016, 02/29/2016, 02/24/2015 FINDINGS: CT CHEST FINDINGS Cardiovascular: Heart size within normal limits. No pericardial fluid/ thickening. Minimal calcifications of the mitral annulus and in the distribution of the circumflex coronary artery. Unremarkable course caliber and contour of the thoracic aorta. Unremarkable diameter of the main pulmonary artery. Mediastinum/Nodes: Unremarkable appearance of the thoracic inlet. No mediastinal lymphadenopathy. Unremarkable course of the thoracic esophagus. Lungs/Pleura: Mixed ground-glass and nodular opacity of the bilateral lungs, predominantly right-sided though involving all lobes. No endotracheal or endobronchial debris. Pattern of opacity predominantly within the distal peribronchovascular distribution. Trace left-sided pleural effusion. CT ABDOMEN PELVIS FINDINGS Hepatobiliary: Hepatomegaly, with the greatest cranial caudal diameter  of the right liver measuring 24 cm. This 2 phase contrast-enhanced CT demonstrates no focal mass within the liver, however, in overall heterogeneous pattern of enhancement/ attenuation with a nodular contour. When compared to prior CT of 2016, the overall size measures slightly larger, with increase nodular contour. Cholelithiasis.  No evidence of inflammatory changes. Pancreas: Unremarkable pancreas Spleen: Cranial caudal span of spleen measures 14.4 cm Adrenals/Urinary Tract: Unremarkable appearance of the kidneys with no hydronephrosis or nephrolithiasis. No inflammatory changes. Unremarkable course of the  bilateral ureters. Unremarkable urinary bladder. Stomach/Bowel: Unremarkable stomach. Unremarkable small bowel appear read Normal appendix. Colonic diverticulae without evidence of acute inflammatory changes. Vascular/Lymphatic: Scattered calcifications of the abdominal aorta. No adenopathy. Reproductive: Unremarkable appearance of the uterus and the adnexa Other: Fat containing umbilical hernia. Musculoskeletal: Widespread metastatic disease of the axial and appendicular skeleton. All of the thoracic and lumbar vertebral bodies demonstrate sclerotic/blastic changes. Degenerative changes are present. No acute pathologic fracture of the thoracic or lumbar levels. Diffuse sclerotic changes of the bilateral hemipelvis and the sacrum. Sclerotic changes of the sternum. Sclerotic changes of the visualized bilateral humerus, bilateral clavicles, and throughout all the bilateral ribs. Sclerotic changes of the proximal bilateral femurs. Surgical changes of left mastectomy. Surgical changes of the right axilla. IMPRESSION: Interval development of multifocal pneumonia. It is uncertain if the previously identified lung nodules on the CT 01/03/2017 represented early inflammatory/infectious changes, or if these represent pulmonary metastases. Attention on future imaging may be useful. Trace left pleural fluid. Widespread skeletal metastases throughout the visualized axial and appendicular skeleton. No pathologic fracture identified. 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 Splenomegaly Cholelithiasis without evidence of acute cholecystitis. Diverticular disease without evidence of acute diverticulitis. Fat containing umbilical hernia. Electronically Signed   By: Corrie Mckusick D.O.   On: 02/27/2017 09:12   Nm Bone Scan Whole Body  Result Date: 02/26/2017 CLINICAL  DATA:  History of breast cancer with bony metastases. EXAM: NUCLEAR MEDICINE WHOLE BODY BONE SCAN TECHNIQUE: Whole body anterior and posterior images were obtained approximately 3 hours after intravenous injection of radiopharmaceutical. RADIOPHARMACEUTICALS:  21.0 mCi Technetium-45mMDP IV COMPARISON:  02/28/2016 FINDINGS: Normal soft tissue and renal uptake. Abnormal radiotracer uptake within the sternum, clavicles, bilateral humeral heads, bilateral proximal femurs, sacrum, bilateral medial ileal bones, numerous thoracic vertebral bodies, and numerous bilateral ribs. Abnormal heterogeneous radiotracer uptake within the calvarium. Compared to the prior study the radiotracer activity within the sternum, bilateral ribs, and bilateral proximal femurs have increased. IMPRESSION: Evidence of progression of widespread skeletal metastatic disease, with increased activity within the sternum, bilateral ribs and bilateral proximal femurs. Electronically Signed   By: DFidela SalisburyM.D.   On: 02/26/2017 15:36   Ct Abdomen Pelvis W Contrast  Result Date: 02/27/2017 CLINICAL DATA:  [age]year old female with a history of metastatic breast cancer. EXAM: CT CHEST, ABDOMEN, AND PELVIS WITH CONTRAST TECHNIQUE: Multidetector CT imaging of the chest, abdomen and pelvis was performed following the standard protocol during bolus administration of intravenous contrast. CONTRAST:  1039mISOVUE-300 IOPAMIDOL (ISOVUE-300) INJECTION 61% COMPARISON:  Ultrasound 02/17/2017, CT 01/03/2017, 07/17/2016, 02/29/2016, 02/24/2015 FINDINGS: CT CHEST FINDINGS Cardiovascular: Heart size within normal limits. No pericardial fluid/ thickening. Minimal calcifications of the mitral annulus and in the distribution of the circumflex coronary artery. Unremarkable course caliber and contour of the thoracic aorta. Unremarkable diameter of the main pulmonary artery. Mediastinum/Nodes: Unremarkable appearance of the thoracic  inlet. No mediastinal  lymphadenopathy. Unremarkable course of the thoracic esophagus. Lungs/Pleura: Mixed ground-glass and nodular opacity of the bilateral lungs, predominantly right-sided though involving all lobes. No endotracheal or endobronchial debris. Pattern of opacity predominantly within the distal peribronchovascular distribution. Trace left-sided pleural effusion. CT ABDOMEN PELVIS FINDINGS Hepatobiliary: Hepatomegaly, with the greatest cranial caudal diameter of the right liver measuring 24 cm. This 2 phase contrast-enhanced CT demonstrates no focal mass within the liver, however, in overall heterogeneous pattern of enhancement/ attenuation with a nodular contour. When compared to prior CT of 2016, the overall size measures slightly larger, with increase nodular contour. Cholelithiasis.  No evidence of inflammatory changes. Pancreas: Unremarkable pancreas Spleen: Cranial caudal span of spleen measures 14.4 cm Adrenals/Urinary Tract: Unremarkable appearance of the kidneys with no hydronephrosis or nephrolithiasis. No inflammatory changes. Unremarkable course of the bilateral ureters. Unremarkable urinary bladder. Stomach/Bowel: Unremarkable stomach. Unremarkable small bowel appear read Normal appendix. Colonic diverticulae without evidence of acute inflammatory changes. Vascular/Lymphatic: Scattered calcifications of the abdominal aorta. No adenopathy. Reproductive: Unremarkable appearance of the uterus and the adnexa Other: Fat containing umbilical hernia. Musculoskeletal: Widespread metastatic disease of the axial and appendicular skeleton. All of the thoracic and lumbar vertebral bodies demonstrate sclerotic/blastic changes. Degenerative changes are present. No acute pathologic fracture of the thoracic or lumbar levels. Diffuse sclerotic changes of the bilateral hemipelvis and the sacrum. Sclerotic changes of the sternum. Sclerotic changes of the visualized bilateral humerus, bilateral clavicles, and throughout all the  bilateral ribs. Sclerotic changes of the proximal bilateral femurs. Surgical changes of left mastectomy. Surgical changes of the right axilla. IMPRESSION: Interval development of multifocal pneumonia. It is uncertain if the previously identified lung nodules on the CT 01/03/2017 represented early inflammatory/infectious changes, or if these represent pulmonary metastases. Attention on future imaging may be useful. Trace left pleural fluid. Widespread skeletal metastases throughout the visualized axial and appendicular skeleton. No pathologic fracture identified. 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 Splenomegaly Cholelithiasis without evidence of acute cholecystitis. Diverticular disease without evidence of acute diverticulitis. Fat containing umbilical hernia. Electronically Signed   By: Corrie Mckusick D.O.   On: 02/27/2017 09:12   Ir US Guide Vasc Access Right  Result Date: 02/27/2017 INDICATION: History of metastatic breast cancer, post left-sided mastectomy. In need of durable intravenous access for chemotherapy administration. EXAM: IMPLANTED PORT A CATH PLACEMENT WITH ULTRASOUND AND FLUOROSCOPIC GUIDANCE COMPARISON:  Chest CT - 02/27/2017 MEDICATIONS: Patient is currently admitted to the hospital and receiving intravenous antibiotics.; The antibiotic was administered within an appropriate time interval prior to skin puncture. ANESTHESIA/SEDATION: Moderate (conscious) sedation was employed during this procedure. A total of Versed 4 mg and Fentanyl 150 mcg was administered intravenously. Moderate Sedation Time: 27 minutes. The patient's level of consciousness and vital signs were monitored continuously by radiology nursing throughout the procedure under my direct supervision. CONTRAST:  None FLUOROSCOPY TIME:  18 seconds (10 mGy)  COMPLICATIONS: None immediate. PROCEDURE: The procedure, risks, benefits, and alternatives were explained to the patient. Questions regarding the procedure were encouraged and answered. The patient understands and consents to the procedure. The right neck and chest were prepped with chlorhexidine in a sterile fashion, and a sterile drape was applied covering the operative field. Maximum barrier sterile technique with sterile gowns and gloves were used for the procedure. A timeout was performed prior to the initiation of the procedure. Local anesthesia was provided  with 1% lidocaine with epinephrine. After creating a small venotomy incision, a micropuncture kit was utilized to access the internal jugular vein. Real-time ultrasound guidance was utilized for vascular access including the acquisition of a permanent ultrasound image documenting patency of the accessed vessel. The microwire was utilized to measure appropriate catheter length. A subcutaneous port pocket was then created along the upper chest wall utilizing a combination of sharp and blunt dissection. The pocket was irrigated with sterile saline. A single lumen power injectable port was chosen for placement. The 8 Fr catheter was tunneled from the port pocket site to the venotomy incision. The port was placed in the pocket. The external catheter was trimmed to appropriate length. At the venotomy, an 8 Fr peel-away sheath was placed over a guidewire under fluoroscopic guidance. The catheter was then placed through the sheath and the sheath was removed. Final catheter positioning was confirmed and documented with a fluoroscopic spot radiograph. The port was accessed with a Huber needle, aspirated and flushed with heparinized saline. The venotomy site was closed with an interrupted 4-0 Vicryl suture. The port pocket incision was closed with interrupted 2-0 Vicryl suture and the skin was opposed with a running subcuticular 4-0 Vicryl suture. Dermabond and  Steri-strips were applied to both incisions. Dressings were placed. The patient tolerated the procedure well without immediate post procedural complication. FINDINGS: After catheter placement, the tip lies within the superior cavoatrial junction. The catheter aspirates and flushes normally and is ready for immediate use. IMPRESSION: Successful placement of a right internal jugular approach power injectable Port-A-Cath. The catheter is ready for immediate use. Electronically Signed   By: Sandi Mariscal M.D.   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 Please perform ultrasound-guided liver lesion biopsy for tissue diagnostic purposes. EXAM: ULTRASOUND GUIDED LIVER LESION BIOPSY COMPARISON:  CT the chest, abdomen and pelvis - 02/27/2017 MEDICATIONS: None ANESTHESIA/SEDATION: Fentanyl 100 mcg IV; Versed 2 mg IV Total Moderate Sedation time: 15 minutes. The patient's level of consciousness and vital signs were monitored continuously by radiology nursing throughout the procedure under my direct supervision. COMPLICATIONS: None immediate. PROCEDURE: Informed written consent was obtained from the patient after a discussion of the risks, benefits and alternatives to treatment. The patient understands and consents the procedure. A timeout was performed prior to the initiation of the procedure. Ultrasound scanning was performed of the right upper abdominal quadrant demonstrates marked heterogeneous appearance of the hepatic parenchyma with nodular contour compatible with findings of cirrhosis demonstrated preceding abdominal CT. No was made of an approximately 1.3 x 1.4 cm hypoechoic lesion within the right lobe of the liver potentially correlating with the ill-defined hypoattenuating lesions seen on preceding abdominal CT image 58, series 7. This  lesion as well as the surrounding hepatic parenchyma was targeted for biopsy for tissue diagnostic purposes. The procedure was planned. The right upper abdominal quadrant was prepped and draped in the usual sterile fashion. The overlying soft tissues were anesthetized with 1% lidocaine with epinephrine. A 17 gauge, 6.8 cm co-axial needle was advanced into a peripheral aspect of the lesion. This was followed by 5 core biopsies with an 18 gauge core device under direct ultrasound guidance. The coaxial needle tract was embolized with a small amount of Gel-Foam slurry and superficial hemostasis was obtained with manual compression. Post procedural scanning was negative for definitive area of  hemorrhage or additional complication. A dressing was placed. The patient tolerated the procedure well without immediate post procedural complication. IMPRESSION: Technically successful ultrasound guided core needle biopsy of indeterminate lesion within the right lobe of the liver. Electronically Signed   By: Sandi Mariscal M.D.   On: 02/27/2017 17:13   Ir Fluoro Guide Port Insertion Right  Result Date: 02/27/2017 INDICATION: History of metastatic breast cancer, post left-sided mastectomy. In need of durable intravenous access for chemotherapy administration. EXAM: IMPLANTED PORT A CATH PLACEMENT WITH ULTRASOUND AND FLUOROSCOPIC GUIDANCE COMPARISON:  Chest CT - 02/27/2017 MEDICATIONS: Patient is currently admitted to the hospital and receiving intravenous antibiotics.; The antibiotic was administered within an appropriate time interval prior to skin puncture. ANESTHESIA/SEDATION: Moderate (conscious) sedation was employed during this procedure. A total of Versed 4 mg and Fentanyl 150 mcg was administered intravenously. Moderate Sedation Time: 27 minutes. The patient's level of consciousness and vital signs were monitored continuously by radiology nursing throughout the procedure under my direct supervision. CONTRAST:  None  FLUOROSCOPY TIME:  18 seconds (10 mGy) COMPLICATIONS: None immediate. PROCEDURE: The procedure, risks, benefits, and alternatives were explained to the patient. Questions regarding the procedure were encouraged and answered. The patient understands and consents to the procedure. The right neck and chest were prepped with chlorhexidine in a sterile fashion, and a sterile drape was applied covering the operative field. Maximum barrier sterile technique with sterile gowns and gloves were used for the procedure. A timeout was performed prior to the initiation of the procedure. Local anesthesia was provided with 1% lidocaine with epinephrine. After creating a small venotomy incision, a micropuncture kit was utilized to access the internal jugular vein. Real-time ultrasound guidance was utilized for vascular access including the acquisition of a permanent ultrasound image documenting patency of the accessed vessel. The microwire was utilized to measure appropriate catheter length. A subcutaneous port pocket was then created along the upper chest wall utilizing a combination of sharp and blunt dissection. The pocket was irrigated with sterile saline. A single lumen power injectable port was chosen for placement. The 8 Fr catheter was tunneled from the port pocket site to the venotomy incision. The port was placed in the pocket. The external catheter was trimmed to appropriate length. At the venotomy, an 8 Fr peel-away sheath was placed over a guidewire under fluoroscopic guidance. The catheter was then placed through the sheath and the sheath was removed. Final catheter positioning was confirmed and documented with a fluoroscopic spot radiograph. The port was accessed with a Huber needle, aspirated and flushed with heparinized saline. The venotomy site was closed with an interrupted 4-0 Vicryl suture. The port pocket incision was closed with interrupted 2-0 Vicryl suture and the skin was opposed with a running subcuticular  4-0 Vicryl suture. Dermabond and Steri-strips were applied to both incisions. Dressings were placed. The patient tolerated the procedure well without immediate post procedural complication. FINDINGS: After catheter placement, the tip lies within the superior cavoatrial junction. The catheter aspirates and flushes normally and is ready for immediate use. IMPRESSION: Successful placement of a right internal jugular approach power injectable Port-A-Cath. The catheter is ready for immediate use. Electronically Signed   By: Sandi Mariscal M.D.   On: 02/27/2017 17:09        Scheduled Meds: . irbesartan  300 mg Oral Daily   And  . amLODipine  10 mg Oral Daily  . loratadine  10 mg Oral Daily  . protein supplement shake  11 oz Oral BID BM  Continuous Infusions: . sodium chloride 75 mL/hr at 02/28/17 0739  . sodium chloride    . sodium chloride    . sodium chloride    . levofloxacin (LEVAQUIN) IV Stopped (02/27/17 1431)     LOS: 4 days    Time spent: 25 minutes    Ysmael Hires Jodie Echevaria, MD [facility] Pager [phone]  If 7PM-7AM, please contact night-coverage [email or web address] Password [email or web address] 02/28/2017, 1:37 PM

## 2017-02-28 NOTE — Progress Notes (Signed)
Danielle Oliver   DOB:03/08/1960   JK#:[id number]   BZJ#:[id number]  Subjective:  Danielle Oliver tolerated liver Bx and port placement yesterday w/o event-- in particular no pain or bleeding problems. She has had a "tickle in her throat" but no severe cough, phlegm or pleurisy. Had diarrhea but that has resolved. Tells me she is taking short walks with minimal assistance. She is scheduled for d/c tomorrow; no family in room   Objective: middle aged African American woman examined in bed  Vitals:   02/27/17 1910 02/28/17 1013  BP: 132/64 133/64  Pulse: 85 92  Resp: 18 18  Temp: (!) 97.5 F (36.4 C) 97.6 F (36.4 C)  SpO2: 99% 93%    Body mass index is 52.03 kg/m. No intake or output data in the 24 hours ending 02/28/17 1110   Lungs no rales or wheezes--auscultated anterolaterally  Heart regular rate and rhythm  Abdomen soft, obese, +BS  Neuro nonfocal  Port in R upper chest, intact  CBG (last 3)  No results for input(s): GLUCAP in the last 72 hours.   Labs:  Lab Results  Component Value Date   WBC 15.9 (H) 02/28/2017   HGB 6.8 (LL) 02/28/2017   HCT 21.7 (L) 02/28/2017   MCV 105.9 (H) 02/28/2017   PLT 28 (LL) 02/28/2017   NEUTROABS 10.8 (H) 02/28/2017    '@LASTCHEMISTRY' @  Urine Studies No results for input(s): UHGB, CRYS in the last 72 hours.  Invalid input(s): UACOL, UAPR, USPG, UPH, UTP, UGL, UKET, UBIL, UNIT, UROB, [place], UEPI, UWBC, [place], [place], [place], [place], [place]  Basic Metabolic Panel: Recent Labs  Lab 02/24/17 1159 02/24/17 1754  02/25/17 0522 02/26/17 0358 02/28/17 0610  NA 138  --   --  137 136 137  K 4.9  --    < > 4.7 4.6 4.3  CL  --   --   --  110 110 110  CO2 18*  --   --  20* 19* 19*  GLUCOSE 116  --   --  94 98 138*  BUN 21.4  --   --  19 19 24*  CREATININE 1.2*  --   --  0.90 1.10* 0.93  CALCIUM 8.7  --   --  8.2* 8.1* 7.7*  MG  --  2.0  --   --   --   --   PHOS  --  2.6  --   --   --   --    < > = values in this interval not displayed.    GFR Estimated Creatinine Clearance: 104.9 mL/min (by C-G formula based on SCr of 0.93 mg/dL). Liver Function Tests: Recent Labs  Lab 02/24/17 1159 02/25/17 0522 02/28/17 0610  AST 107* 108* 93*  ALT 59* 54 55*  ALKPHOS 467* 366* 343*  BILITOT 11.98* 11.9* 14.8*  PROT 7.1 6.1* 6.0*  ALBUMIN 2.3* 2.1* 2.1*   No results for input(s): LIPASE, AMYLASE in the last 168 hours. No results for input(s): AMMONIA in the last 168 hours. Coagulation profile Recent Labs  Lab 02/24/17 1049 02/27/17 1055  INR 2.20 1.22  PROTIME 26.4*  --     CBC: Recent Labs  Lab 02/24/17 1049 02/25/17 0522 02/26/17 0358 02/27/17 0540 02/28/17 0610  WBC 10.8* 11.4* 14.4* 18.1* 15.9*  NEUTROABS 6.6*  --  8.5* 11.4* 10.8*  HGB 9.3* 7.5* 7.3* 7.8* 6.8*  HCT 31.0* 24.7* 23.2* 25.4* 21.7*  MCV 106.5* 105.6* 106.4* 105.4* 105.9*  PLT 14* 16* 49* 53* 28*  Cardiac Enzymes: No results for input(s): CKTOTAL, CKMB, CKMBINDEX, TROPONINI in the last 168 hours. BNP: Invalid input(s): POCBNP CBG: No results for input(s): GLUCAP in the last 168 hours. D-Dimer No results for input(s): DDIMER in the last 72 hours. Hgb A1c No results for input(s): HGBA1C in the last 72 hours. Lipid Profile No results for input(s): CHOL, HDL, LDLCALC, TRIG, CHOLHDL, LDLDIRECT in the last 72 hours. Thyroid function studies No results for input(s): TSH, T4TOTAL, T3FREE, THYROIDAB in the last 72 hours.  Invalid input(s): FREET3 Anemia work up No results for input(s): VITAMINB12, FOLATE, FERRITIN, TIBC, IRON, RETICCTPCT in the last 72 hours. Microbiology Recent Results (from the past 240 hour(s))  TECHNOLOGIST REVIEW     Status: None   Collection Time: 02/24/17 10:49 AM  Result Value Ref Range Status   Technologist Review Metas and Myelocytes,2% blasts, 26% nrbcs present  Final      Studies:  Ct Head W & Wo Contrast  Result Date: 02/27/2017 CLINICAL DATA:  56 year old female. Metastatic breast cancer staging.  EXAM: CT HEAD WITHOUT AND WITH CONTRAST TECHNIQUE: Contiguous axial images were obtained from the base of the skull through the vertex without and with intravenous contrast CONTRAST:  13 hour steroid premedication. Contrast tolerated with no adverse events. 171m ISOVUE-300 IOPAMIDOL (ISOVUE-300) INJECTION 61% in conjunction with contrast enhanced imaging of the chest, abdomen, and pelvis reported separately. COMPARISON:  Skull radiographs 06/12/2016. FINDINGS: Brain: Partially empty sella. Cerebral volume within normal limits. No midline shift, ventriculomegaly, mass effect, evidence of mass lesion, intracranial hemorrhage or evidence of cortically based acute infarction. Gray-white matter differentiation is within normal limits throughout the brain. No abnormal enhancement identified.  No encephalomalacia identified. Vascular: The major intracranial vascular structures appear to be enhancing and patent. Skull: Widespread sclerotic osseous metastatic disease. Discrete sclerotic lesions most apparent at the right lower clivus, and in both frontal bones (e.g. Series 14, image 17). No destructive osseous lesion identified. Sinuses/Orbits: Paranasal sinuses are well pneumatized. Left greater than right mastoid effusions. Tympanic cavities remain clear. Other: Leftward gaze deviation. Otherwise negative visible orbit and scalp soft tissues. IMPRESSION: 1. Diffuse sclerotic metastatic disease throughout the skull and visible facial bones. No destructive osseous lesion identified. 2. No metastatic disease to the brain identified; negative CT appearance of the brain. Electronically Signed   By: HGenevie AnnM.D.   On: 02/27/2017 08:58   Ct Chest W Contrast  Result Date: 02/27/2017 CLINICAL DATA:  56year old female with a history of metastatic breast cancer. EXAM: CT CHEST, ABDOMEN, AND PELVIS WITH CONTRAST TECHNIQUE: Multidetector CT imaging of the chest, abdomen and pelvis was performed following the standard protocol  during bolus administration of intravenous contrast. CONTRAST:  107mISOVUE-300 IOPAMIDOL (ISOVUE-300) INJECTION 61% COMPARISON:  Ultrasound 02/17/2017, CT 01/03/2017, 07/17/2016, 02/29/2016, 02/24/2015 FINDINGS: CT CHEST FINDINGS Cardiovascular: Heart size within normal limits. No pericardial fluid/ thickening. Minimal calcifications of the mitral annulus and in the distribution of the circumflex coronary artery. Unremarkable course caliber and contour of the thoracic aorta. Unremarkable diameter of the main pulmonary artery. Mediastinum/Nodes: Unremarkable appearance of the thoracic inlet. No mediastinal lymphadenopathy. Unremarkable course of the thoracic esophagus. Lungs/Pleura: Mixed ground-glass and nodular opacity of the bilateral lungs, predominantly right-sided though involving all lobes. No endotracheal or endobronchial debris. Pattern of opacity predominantly within the distal peribronchovascular distribution. Trace left-sided pleural effusion. CT ABDOMEN PELVIS FINDINGS Hepatobiliary: Hepatomegaly, with the greatest cranial caudal diameter of the right liver measuring 24 cm. This 2 phase contrast-enhanced CT demonstrates no focal mass within  the liver, however, in overall heterogeneous pattern of enhancement/ attenuation with a nodular contour. When compared to prior CT of 2016, the overall size measures slightly larger, with increase nodular contour. Cholelithiasis.  No evidence of inflammatory changes. Pancreas: Unremarkable pancreas Spleen: Cranial caudal span of spleen measures 14.4 cm Adrenals/Urinary Tract: Unremarkable appearance of the kidneys with no hydronephrosis or nephrolithiasis. No inflammatory changes. Unremarkable course of the bilateral ureters. Unremarkable urinary bladder. Stomach/Bowel: Unremarkable stomach. Unremarkable small bowel appear read Normal appendix. Colonic diverticulae without evidence of acute inflammatory changes. Vascular/Lymphatic: Scattered calcifications of the  abdominal aorta. No adenopathy. Reproductive: Unremarkable appearance of the uterus and the adnexa Other: Fat containing umbilical hernia. Musculoskeletal: Widespread metastatic disease of the axial and appendicular skeleton. All of the thoracic and lumbar vertebral bodies demonstrate sclerotic/blastic changes. Degenerative changes are present. No acute pathologic fracture of the thoracic or lumbar levels. Diffuse sclerotic changes of the bilateral hemipelvis and the sacrum. Sclerotic changes of the sternum. Sclerotic changes of the visualized bilateral humerus, bilateral clavicles, and throughout all the bilateral ribs. Sclerotic changes of the proximal bilateral femurs. Surgical changes of left mastectomy. Surgical changes of the right axilla. IMPRESSION: Interval development of multifocal pneumonia. It is uncertain if the previously identified lung nodules on the CT 01/03/2017 represented early inflammatory/infectious changes, or if these represent pulmonary metastases. Attention on future imaging may be useful. Trace left pleural fluid. Widespread skeletal metastases throughout the visualized axial and appendicular skeleton. No pathologic fracture identified. 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 Splenomegaly Cholelithiasis without evidence of acute cholecystitis. Diverticular disease without evidence of acute diverticulitis. Fat containing umbilical hernia. Electronically Signed   By: Corrie Mckusick D.O.   On: 02/27/2017 09:12   Nm Bone Scan Whole Body  Result Date: 02/26/2017 CLINICAL DATA:  History of breast cancer with bony metastases. EXAM: NUCLEAR MEDICINE WHOLE BODY BONE SCAN TECHNIQUE: Whole body anterior and posterior images were obtained approximately 3 hours after intravenous injection of radiopharmaceutical. RADIOPHARMACEUTICALS:   21.0 mCi Technetium-42mMDP IV COMPARISON:  02/28/2016 FINDINGS: Normal soft tissue and renal uptake. Abnormal radiotracer uptake within the sternum, clavicles, bilateral humeral heads, bilateral proximal femurs, sacrum, bilateral medial ileal bones, numerous thoracic vertebral bodies, and numerous bilateral ribs. Abnormal heterogeneous radiotracer uptake within the calvarium. Compared to the prior study the radiotracer activity within the sternum, bilateral ribs, and bilateral proximal femurs have increased. IMPRESSION: Evidence of progression of widespread skeletal metastatic disease, with increased activity within the sternum, bilateral ribs and bilateral proximal femurs. Electronically Signed   By: DFidela SalisburyM.D.   On: 02/26/2017 15:36   Ct Abdomen Pelvis W Contrast  Result Date: 02/27/2017 CLINICAL DATA:  56year old female with a history of metastatic breast cancer. EXAM: CT CHEST, ABDOMEN, AND PELVIS WITH CONTRAST TECHNIQUE: Multidetector CT imaging of the chest, abdomen and pelvis was performed following the standard protocol during bolus administration of intravenous contrast. CONTRAST:  1096mISOVUE-300 IOPAMIDOL (ISOVUE-300) INJECTION 61% COMPARISON:  Ultrasound 02/17/2017, CT 01/03/2017, 07/17/2016, 02/29/2016, 02/24/2015 FINDINGS: CT CHEST FINDINGS Cardiovascular: Heart size within normal limits. No pericardial fluid/ thickening. Minimal calcifications of the mitral annulus and in the distribution of the circumflex coronary artery. Unremarkable course caliber and contour of the thoracic aorta. Unremarkable diameter of the main pulmonary artery. Mediastinum/Nodes: Unremarkable appearance of the thoracic inlet. No mediastinal lymphadenopathy. Unremarkable course of the thoracic esophagus. Lungs/Pleura: Mixed ground-glass and nodular opacity of  the bilateral lungs, predominantly right-sided though involving all lobes. No endotracheal or endobronchial debris. Pattern of opacity predominantly  within the distal peribronchovascular distribution. Trace left-sided pleural effusion. CT ABDOMEN PELVIS FINDINGS Hepatobiliary: Hepatomegaly, with the greatest cranial caudal diameter of the right liver measuring 24 cm. This 2 phase contrast-enhanced CT demonstrates no focal mass within the liver, however, in overall heterogeneous pattern of enhancement/ attenuation with a nodular contour. When compared to prior CT of 2016, the overall size measures slightly larger, with increase nodular contour. Cholelithiasis.  No evidence of inflammatory changes. Pancreas: Unremarkable pancreas Spleen: Cranial caudal span of spleen measures 14.4 cm Adrenals/Urinary Tract: Unremarkable appearance of the kidneys with no hydronephrosis or nephrolithiasis. No inflammatory changes. Unremarkable course of the bilateral ureters. Unremarkable urinary bladder. Stomach/Bowel: Unremarkable stomach. Unremarkable small bowel appear read Normal appendix. Colonic diverticulae without evidence of acute inflammatory changes. Vascular/Lymphatic: Scattered calcifications of the abdominal aorta. No adenopathy. Reproductive: Unremarkable appearance of the uterus and the adnexa Other: Fat containing umbilical hernia. Musculoskeletal: Widespread metastatic disease of the axial and appendicular skeleton. All of the thoracic and lumbar vertebral bodies demonstrate sclerotic/blastic changes. Degenerative changes are present. No acute pathologic fracture of the thoracic or lumbar levels. Diffuse sclerotic changes of the bilateral hemipelvis and the sacrum. Sclerotic changes of the sternum. Sclerotic changes of the visualized bilateral humerus, bilateral clavicles, and throughout all the bilateral ribs. Sclerotic changes of the proximal bilateral femurs. Surgical changes of left mastectomy. Surgical changes of the right axilla. IMPRESSION: Interval development of multifocal pneumonia. It is uncertain if the previously identified lung nodules on the CT  01/03/2017 represented early inflammatory/infectious changes, or if these represent pulmonary metastases. Attention on future imaging may be useful. Trace left pleural fluid. Widespread skeletal metastases throughout the visualized axial and appendicular skeleton. No pathologic fracture identified. 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 Splenomegaly Cholelithiasis without evidence of acute cholecystitis. Diverticular disease without evidence of acute diverticulitis. Fat containing umbilical hernia. Electronically Signed   By: Corrie Mckusick D.O.   On: 02/27/2017 09:12   Ir US Guide Vasc Access Right  Result Date: 02/27/2017 INDICATION: History of metastatic breast cancer, post left-sided mastectomy. In need of durable intravenous access for chemotherapy administration. EXAM: IMPLANTED PORT A CATH PLACEMENT WITH ULTRASOUND AND FLUOROSCOPIC GUIDANCE COMPARISON:  Chest CT - 02/27/2017 MEDICATIONS: Patient is currently admitted to the hospital and receiving intravenous antibiotics.; The antibiotic was administered within an appropriate time interval prior to skin puncture. ANESTHESIA/SEDATION: Moderate (conscious) sedation was employed during this procedure. A total of Versed 4 mg and Fentanyl 150 mcg was administered intravenously. Moderate Sedation Time: 27 minutes. The patient's level of consciousness and vital signs were monitored continuously by radiology nursing throughout the procedure under my direct supervision. CONTRAST:  None FLUOROSCOPY TIME:  18 seconds (10 mGy) COMPLICATIONS: None immediate. PROCEDURE: The procedure, risks, benefits, and alternatives were explained to the patient. Questions regarding the procedure were encouraged and answered. The patient understands and consents to the procedure. The right neck and chest were prepped  with chlorhexidine in a sterile fashion, and a sterile drape was applied covering the operative field. Maximum barrier sterile technique with sterile gowns and gloves were used for the procedure. A timeout was performed prior to the initiation of the procedure. Local anesthesia was provided with 1% lidocaine with epinephrine. After creating a small venotomy incision, a micropuncture kit was utilized to  access the internal jugular vein. Real-time ultrasound guidance was utilized for vascular access including the acquisition of a permanent ultrasound image documenting patency of the accessed vessel. The microwire was utilized to measure appropriate catheter length. A subcutaneous port pocket was then created along the upper chest wall utilizing a combination of sharp and blunt dissection. The pocket was irrigated with sterile saline. A single lumen power injectable port was chosen for placement. The 8 Fr catheter was tunneled from the port pocket site to the venotomy incision. The port was placed in the pocket. The external catheter was trimmed to appropriate length. At the venotomy, an 8 Fr peel-away sheath was placed over a guidewire under fluoroscopic guidance. The catheter was then placed through the sheath and the sheath was removed. Final catheter positioning was confirmed and documented with a fluoroscopic spot radiograph. The port was accessed with a Huber needle, aspirated and flushed with heparinized saline. The venotomy site was closed with an interrupted 4-0 Vicryl suture. The port pocket incision was closed with interrupted 2-0 Vicryl suture and the skin was opposed with a running subcuticular 4-0 Vicryl suture. Dermabond and Steri-strips were applied to both incisions. Dressings were placed. The patient tolerated the procedure well without immediate post procedural complication. FINDINGS: After catheter placement, the tip lies within the superior cavoatrial junction. The catheter aspirates and flushes  normally and is ready for immediate use. IMPRESSION: Successful placement of a right internal jugular approach power injectable Port-A-Cath. The catheter is ready for immediate use. Electronically Signed   By: Sandi Mariscal M.D.   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 Please perform ultrasound-guided liver lesion biopsy for tissue diagnostic purposes. EXAM: ULTRASOUND GUIDED LIVER LESION BIOPSY COMPARISON:  CT the chest, abdomen and pelvis - 02/27/2017 MEDICATIONS: None ANESTHESIA/SEDATION: Fentanyl 100 mcg IV; Versed 2 mg IV Total Moderate Sedation time: 15 minutes. The patient's level of consciousness and vital signs were monitored continuously by radiology nursing throughout the procedure under my direct supervision. COMPLICATIONS: None immediate. PROCEDURE: Informed written consent was obtained from the patient after a discussion of the risks, benefits and alternatives to treatment. The patient understands and consents the procedure. A timeout was performed prior to the initiation of the procedure. Ultrasound scanning was performed of the right upper abdominal quadrant demonstrates marked heterogeneous appearance of the hepatic parenchyma with nodular contour compatible with findings of cirrhosis demonstrated preceding abdominal CT. No was made of an approximately 1.3 x 1.4 cm hypoechoic lesion within the right lobe of the liver potentially correlating with the ill-defined hypoattenuating lesions seen on preceding abdominal CT image 58, series 7. This lesion as well as the surrounding hepatic parenchyma was targeted for biopsy for tissue diagnostic purposes. The procedure was planned. The right upper abdominal quadrant was prepped and draped in the usual sterile fashion. The overlying soft tissues were anesthetized with 1%  lidocaine with epinephrine. A 17 gauge, 6.8 cm co-axial needle was advanced into a peripheral aspect of the lesion. This was followed by 5 core biopsies with an 18 gauge core device under direct ultrasound guidance. The coaxial needle tract was embolized with a small amount of Gel-Foam slurry and superficial hemostasis was obtained with manual compression. Post procedural scanning was negative for definitive area of hemorrhage or additional complication. A dressing was placed. The patient tolerated the procedure well without immediate post  procedural complication. IMPRESSION: Technically successful ultrasound guided core needle biopsy of indeterminate lesion within the right lobe of the liver. Electronically Signed   By: Sandi Mariscal M.D.   On: 02/27/2017 17:13   Ir Fluoro Guide Port Insertion Right  Result Date: 02/27/2017 INDICATION: History of metastatic breast cancer, post left-sided mastectomy. In need of durable intravenous access for chemotherapy administration. EXAM: IMPLANTED PORT A CATH PLACEMENT WITH ULTRASOUND AND FLUOROSCOPIC GUIDANCE COMPARISON:  Chest CT - 02/27/2017 MEDICATIONS: Patient is currently admitted to the hospital and receiving intravenous antibiotics.; The antibiotic was administered within an appropriate time interval prior to skin puncture. ANESTHESIA/SEDATION: Moderate (conscious) sedation was employed during this procedure. A total of Versed 4 mg and Fentanyl 150 mcg was administered intravenously. Moderate Sedation Time: 27 minutes. The patient's level of consciousness and vital signs were monitored continuously by radiology nursing throughout the procedure under my direct supervision. CONTRAST:  None FLUOROSCOPY TIME:  18 seconds (10 mGy) COMPLICATIONS: None immediate. PROCEDURE: The procedure, risks, benefits, and alternatives were explained to the patient. Questions regarding the procedure were encouraged and answered. The patient understands and consents to the procedure. The  right neck and chest were prepped with chlorhexidine in a sterile fashion, and a sterile drape was applied covering the operative field. Maximum barrier sterile technique with sterile gowns and gloves were used for the procedure. A timeout was performed prior to the initiation of the procedure. Local anesthesia was provided with 1% lidocaine with epinephrine. After creating a small venotomy incision, a micropuncture kit was utilized to access the internal jugular vein. Real-time ultrasound guidance was utilized for vascular access including the acquisition of a permanent ultrasound image documenting patency of the accessed vessel. The microwire was utilized to measure appropriate catheter length. A subcutaneous port pocket was then created along the upper chest wall utilizing a combination of sharp and blunt dissection. The pocket was irrigated with sterile saline. A single lumen power injectable port was chosen for placement. The 8 Fr catheter was tunneled from the port pocket site to the venotomy incision. The port was placed in the pocket. The external catheter was trimmed to appropriate length. At the venotomy, an 8 Fr peel-away sheath was placed over a guidewire under fluoroscopic guidance. The catheter was then placed through the sheath and the sheath was removed. Final catheter positioning was confirmed and documented with a fluoroscopic spot radiograph. The port was accessed with a Huber needle, aspirated and flushed with heparinized saline. The venotomy site was closed with an interrupted 4-0 Vicryl suture. The port pocket incision was closed with interrupted 2-0 Vicryl suture and the skin was opposed with a running subcuticular 4-0 Vicryl suture. Dermabond and Steri-strips were applied to both incisions. Dressings were placed. The patient tolerated the procedure well without immediate post procedural complication. FINDINGS: After catheter placement, the tip lies within the superior cavoatrial junction. The  catheter aspirates and flushes normally and is ready for immediate use. IMPRESSION: Successful placement of a right internal jugular approach power injectable Port-A-Cath. The catheter is ready for immediate use. Electronically Signed   By: Sandi Mariscal M.D.   On: 02/27/2017 17:09    Assessment: 56 y.o. [place] woman  (1) status post left mastectomy in 1991 followed by adjuvant chemotherapy Consisting of doxorubicin and cyclophosphamide given every 21 days 4 (records under "media")  (2) status post right breast upper outer quadrant biopsy 01/13/2014 for a  cT2 p N1, stage IIB invasive ductal carcinoma, estrogen and progesterone receptor positive, HER-2 negative, with  an MIB-1 of 10%.  METASTATIC DISEASE: CT scan 03/18/2014 shows multiple sclerotic bone mets but no visceral disease at that time             (1) no bone biopsy obtained to this point             (b) CA 27-29 is informative  (3) anastrozole started January 2016, discontinued September 2018 because of persistent low counts with palbociclib             (a)  palbociclib added 11/21/2014 at 125 mg daily, 21/7             (b) Palbociclib dose decreased to 100 mg daily, 21/ 7, with cycle 2, starting 12/21/2014             (c) palbociclib held after 11/07/2016 because of continuing low counts             (d) anastrozole discontinued December 2018 with evidence of disease progression  (4) vitamin D deficiency: On replacement  (5) status post right lumpectomy 02/17/2015 for a pT2 pNX invasive ductal carcinoma, grade 1, estrogen receptor 100% positive, progesterone receptor 20% positive, HER-2 not amplified. Margins were negative             (a) radiation therapy to lumpectomy site declined by patient  (6) denosumab/ Xgeva started 12/15/2014, repeated monthly  (7) genetics testing discussed--patient demurs  (8) started fulvestrant 12/20/2016, the last dose 02/14/2017, discontinued with disease progression  (9) RESTAGING  December 2018             (a) abdominal ultrasound 02/17/2017 shows multiple liver lesions             (b) CT scans of the brain, chest, abdomen and pelvis pending             (c) liver biopsy 02/27/2017-- prognostic panel pending  (10) to start CM[facility] chemotherapy 03/07/2017    Plan:  Danielle Oliver has responded well to fluid and blood product support. She did well with her liver biopsy yesterday and I have requested a prognostic panel on the tissue. Assuming no change from baseline-- specifically no HER-2 positivity--the plan is for her to start chemotherapy 03/07/2017.  The infiltrates in her lung could be reactive or infective, but also could represent lymphangitic spread of her cancer. We will review these when we obtain restaging studies in approximately 12 weeks assuming no symptomatic developments before then.  She will see [place] again in the office 01/04 and knows to call us with any problems that may develop before then.  Greatly appreciate your help to this patient and her famiily!   Bobetta Lime, MD 02/28/2017  11:10 AM Medical Oncology and Hematology [facility] [address] [place], [place] [address] Tel. [phone]    Fax. [phone]

## 2017-03-01 DIAGNOSIS — R531 Weakness: Secondary | ICD-10-CM

## 2017-03-01 LAB — CBC WITH DIFFERENTIAL/PLATELET
BAND NEUTROPHILS: 3 %
Basophils Absolute: 0.2 10*3/uL — ABNORMAL HIGH (ref 0.0–0.1)
Basophils Relative: 1 %
EOS PCT: 3 %
Eosinophils Absolute: 0.5 10*3/uL (ref 0.0–0.7)
HEMATOCRIT: 26 % — AB (ref 36.0–46.0)
Hemoglobin: 8.4 g/dL — ABNORMAL LOW (ref 12.0–15.0)
Lymphocytes Relative: 37 %
Lymphs Abs: 6.4 10*3/uL — ABNORMAL HIGH (ref 0.7–4.0)
MCH: 32.2 pg (ref 26.0–34.0)
MCHC: 32.3 g/dL (ref 30.0–36.0)
MCV: 99.6 fL (ref 78.0–100.0)
METAMYELOCYTES PCT: 6 %
MONOS PCT: 8 %
Monocytes Absolute: 1.4 10*3/uL — ABNORMAL HIGH (ref 0.1–1.0)
Neutro Abs: 8.8 10*3/uL — ABNORMAL HIGH (ref 1.7–7.7)
Neutrophils Relative %: 42 %
Platelets: 14 10*3/uL — CL (ref 150–400)
RBC: 2.61 MIL/uL — ABNORMAL LOW (ref 3.87–5.11)
RDW: 23.5 % — ABNORMAL HIGH (ref 11.5–15.5)
WBC: 17.3 10*3/uL — ABNORMAL HIGH (ref 4.0–10.5)

## 2017-03-01 LAB — TYPE AND SCREEN
ABO/RH(D): O POS
Antibody Screen: NEGATIVE
UNIT DIVISION: 0
UNIT DIVISION: 0

## 2017-03-01 LAB — BPAM RBC
BLOOD PRODUCT EXPIRATION DATE: 201901172359
Blood Product Expiration Date: 201901262359
ISSUE DATE / TIME: 201812281405
ISSUE DATE / TIME: 201812281853
UNIT TYPE AND RH: 5100
Unit Type and Rh: 5100

## 2017-03-01 MED ORDER — SODIUM CHLORIDE 0.9 % IV SOLN
Freq: Once | INTRAVENOUS | Status: AC
  Administered 2017-03-01: 17:00:00 via INTRAVENOUS

## 2017-03-01 NOTE — Progress Notes (Addendum)
PROGRESS NOTE    Danielle Oliver  BSJ:[id number] DOB: February 07, 1961 DOA: 02/24/2017 PCP: Helane Rima, MD   Brief Narrative:  Patient is a 56 year old African-American female with past medical history of metastatic breast cancer with metastases to liver, bone and lung, morbid obesity who was sent here with a direct admission from [facility]/oncology office.  Patient reported of generalized weakness, fatigue on minimal exertion and 30 pound weight loss in the last month. Patient was found to be anemic, thrombocytopenic and with elevation of bilirubin,  INR and liver enzymes.  Assessment & Plan:  Metastatic breast cancer: With metastases to liver, bone .Likely lungs also Oncology following.  She will be followed by oncology as an outpatient. Palliative consult also following. S/P liver biopsy and PAC placement here by IR. Patient follows with Dr. Jana Hakim. She was diagnosed with a stage III left breast cancer in 40 when she was 56 years old.  Apparently she had a left mastectomy and 4 cycles of adjuvant chemotherapy at that time. She underwent diagnostic mammogram on 01/12/2014 which showed a 3.6 cm lobulated solid mass in the right upper quadrant of the right breast.  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  Her right axilla lymph node biopsy also showed invasive adenocarcinoma.  She underwent right lumpectomy on 02/17/15. CT scan on 03/18/14 showed multiple sclerotic bone metastases but no visceral disease. She was started on fulvestrant on 12/20/16 but discontinued due to disease progression. At present, cancer has  metastasized to the liver and bone.  Ultrasound of the liver done on 02/17/17 showed innumerable small hypoechoic liver masses the largest measuring 1.8 cm on the left and two-point centimeter on the right.  On this admission: Nuclear bone scan :Evidence of  progression of widespread skeletal metastatic disease, with increased activity within the sternum, bilateral ribs and bilateral proximal femurs  CT head :Does not show any brain metastasis but diffuse sclerotic metastatic disease throughout the skull and visible facial bones. No destructive osseous lesion identified  CT abdomen/pelvis/chest:Mixed ground-glass and nodular opacity of the bilateral lungs, predominantly right-sided though involving all lobes.  Multifocal pneumonia versus metastatic disease.Likely this is metastatic disease.  Patient has been started on Levaquin.  She is febrile and her lungs are clear at present. Also showed hepatic metastatic disease.  Follow up with oncology on 03/07/2017.  Elevated liver enzymes:  Due to liver mets, s/p biopsy  Failure to thrive / Severe protein calorie malnutrition In the context of chronic illness. Diet as tolerated   Anemia/severe thrombocytopenia: Most likely secondary to liver metastasis.  Platelets count 16,000 on admission.  S/P 4 units of platelets with improvement to 53000 so she underwent liver biopsy.  Platelets comes down to 28,000 today.  I do not think she needs transfusion unless her platelets counts falls below 10,000 or she bleeds. Hgb 6.8 on 12/28 - transfused 2 U PRBC Platelets 14 this am, transfuse 1 U platelets   Generalized weakness/volume depletion: as tolerated   Hypertension: Stable   Headache /Earache: Supportive care    DVT prophylaxis: SCD's Code Status: Full Family Communication: No family at the bedside  Disposition Plan: home in am if platelets stable    Consultants: Oncology,Palliative  Procedures:None   Subjective: No overnight events.  Objective: Vitals:   02/28/17 1900 02/28/17 1915 02/28/17 2115 03/01/17 0517  BP: 107/80 102/66 (!) 98/52 130/64  Pulse: 100 92 94 92  Resp:  '20 20 20 16  ' Temp: 98.1 F (36.7 C) 98.3 F (36.8 C) 98.3 F (36.8 C) 98.1 F (36.7 C)  TempSrc: Oral Oral  Oral Oral  SpO2: 100% 94% 91% 94%  Weight:      Height:        Intake/Output Summary (Last 24 hours) at 03/01/2017 1218 Last data filed at 03/01/2017 0300 Gross per 24 hour  Intake 2085 ml  Output -  Net 2085 ml   Filed Weights   02/24/17 1609 02/24/17 1651  Weight: (!) 150.7 kg (332 lb 3.7 oz) (!) 150.7 kg (332 lb 3.7 oz)    Physical Exam  Constitutional: Appears well-developed and well-nourished. No distress.  CVS: RRR, S1/S2 + Pulmonary: Effort and breath sounds normal, no stridor, rhonchi, wheezes, rales.  Abdominal: Soft. BS +,  no distension, tenderness, rebound or guarding.  Musculoskeletal: Normal range of motion. No edema and no tenderness.  Lymphadenopathy: No lymphadenopathy noted, cervical, inguinal. Neuro: Alert. Normal reflexes, muscle tone coordination. No cranial nerve deficit. Skin: Skin is warm and dry. No rash noted. Not diaphoretic. No erythema. No pallor.  Psychiatric: Normal mood and affect. Behavior, judgment, thought content normal.    Data Reviewed: I have personally reviewed following labs and imaging studies  CBC: Recent Labs  Lab 02/24/17 1049 02/25/17 0522 02/26/17 0358 02/27/17 0540 02/28/17 0610 03/01/17 0500  WBC 10.8* 11.4* 14.4* 18.1* 15.9* 17.3*  NEUTROABS 6.6*  --  8.5* 11.4* 10.8* 8.8*  HGB 9.3* 7.5* 7.3* 7.8* 6.8* 8.4*  HCT 31.0* 24.7* 23.2* 25.4* 21.7* 26.0*  MCV 106.5* 105.6* 106.4* 105.4* 105.9* 99.6  PLT 14* 16* 49* 53* 28* 14*   Basic Metabolic Panel: Recent Labs  Lab 02/24/17 1159 02/24/17 1754 02/25/17 0522 02/26/17 0358 02/28/17 0610  NA 138  --  137 136 137  K 4.9  --  4.7 4.6 4.3  CL  --   --  110 110 110  CO2 18*  --  20* 19* 19*  GLUCOSE 116  --  94 98 138*  BUN 21.4  --  19 19 24*  CREATININE 1.2*  --  0.90 1.10* 0.93  CALCIUM 8.7  --  8.2* 8.1* 7.7*  MG  --  2.0  --   --   --   PHOS  --  2.6  --   --   --    GFR: Estimated Creatinine Clearance: 104.9 mL/min (by C-G formula based on SCr of 0.93  mg/dL). Liver Function Tests: Recent Labs  Lab 02/24/17 1159 02/25/17 0522 02/28/17 0610  AST 107* 108* 93*  ALT 59* 54 55*  ALKPHOS 467* 366* 343*  BILITOT 11.98* 11.9* 14.8*  PROT 7.1 6.1* 6.0*  ALBUMIN 2.3* 2.1* 2.1*   No results for input(s): LIPASE, AMYLASE in the last 168 hours. No results for input(s): AMMONIA in the last 168 hours. Coagulation Profile: Recent Labs  Lab 02/24/17 1049 02/27/17 1055  INR 2.20 1.22  PROTIME 26.4*  --    Cardiac Enzymes: No results for input(s): CKTOTAL, CKMB, CKMBINDEX, TROPONINI in the last 168 hours. BNP (last 3 results) No results for input(s): PROBNP in the last 8760 hours. HbA1C: No results for input(s): HGBA1C in the last 72 hours. CBG: No results for input(s): GLUCAP in the last 168 hours. Lipid Profile: No results for input(s): CHOL, HDL, LDLCALC, TRIG, CHOLHDL, LDLDIRECT in the last 72 hours. Thyroid Function Tests: No results for input(s): TSH, T4TOTAL, FREET4, T3FREE, THYROIDAB in the last 72 hours. Anemia Panel: No  results for input(s): VITAMINB12, FOLATE, FERRITIN, TIBC, IRON, RETICCTPCT in the last 72 hours. Sepsis Labs: No results for input(s): PROCALCITON, LATICACIDVEN in the last 168 hours.  Recent Results (from the past 240 hour(s))  TECHNOLOGIST REVIEW     Status: None   Collection Time: 02/24/17 10:49 AM  Result Value Ref Range Status   Technologist Review Metas and Myelocytes,2% blasts, 26% nrbcs present  Final         Radiology Studies: Ir US Guide Vasc Access Right  Result Date: 02/27/2017 INDICATION: History of metastatic breast cancer, post left-sided mastectomy. In need of durable intravenous access for chemotherapy administration. EXAM: IMPLANTED PORT A CATH PLACEMENT WITH ULTRASOUND AND FLUOROSCOPIC GUIDANCE COMPARISON:  Chest CT - 02/27/2017 MEDICATIONS: Patient is currently admitted to the hospital and receiving intravenous antibiotics.; The antibiotic was administered within an appropriate  time interval prior to skin puncture. ANESTHESIA/SEDATION: Moderate (conscious) sedation was employed during this procedure. A total of Versed 4 mg and Fentanyl 150 mcg was administered intravenously. Moderate Sedation Time: 27 minutes. The patient's level of consciousness and vital signs were monitored continuously by radiology nursing throughout the procedure under my direct supervision. CONTRAST:  None FLUOROSCOPY TIME:  18 seconds (10 mGy) COMPLICATIONS: None immediate. PROCEDURE: The procedure, risks, benefits, and alternatives were explained to the patient. Questions regarding the procedure were encouraged and answered. The patient understands and consents to the procedure. The right neck and chest were prepped with chlorhexidine in a sterile fashion, and a sterile drape was applied covering the operative field. Maximum barrier sterile technique with sterile gowns and gloves were used for the procedure. A timeout was performed prior to the initiation of the procedure. Local anesthesia was provided with 1% lidocaine with epinephrine. After creating a small venotomy incision, a micropuncture kit was utilized to access the internal jugular vein. Real-time ultrasound guidance was utilized for vascular access including the acquisition of a permanent ultrasound image documenting patency of the accessed vessel. The microwire was utilized to measure appropriate catheter length. A subcutaneous port pocket was then created along the upper chest wall utilizing a combination of sharp and blunt dissection. The pocket was irrigated with sterile saline. A single lumen power injectable port was chosen for placement. The 8 Fr catheter was tunneled from the port pocket site to the venotomy incision. The port was placed in the pocket. The external catheter was trimmed to appropriate length. At the venotomy, an 8 Fr peel-away sheath was placed over a guidewire under fluoroscopic guidance. The catheter was then placed through the  sheath and the sheath was removed. Final catheter positioning was confirmed and documented with a fluoroscopic spot radiograph. The port was accessed with a Huber needle, aspirated and flushed with heparinized saline. The venotomy site was closed with an interrupted 4-0 Vicryl suture. The port pocket incision was closed with interrupted 2-0 Vicryl suture and the skin was opposed with a running subcuticular 4-0 Vicryl suture. Dermabond and Steri-strips were applied to both incisions. Dressings were placed. The patient tolerated the procedure well without immediate post procedural complication. FINDINGS: After catheter placement, the tip lies within the superior cavoatrial junction. The catheter aspirates and flushes normally and is ready for immediate use. IMPRESSION: Successful placement of a right internal jugular approach power injectable Port-A-Cath. The catheter is ready for immediate use. Electronically Signed   By: Sandi Mariscal M.D.   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 Please perform ultrasound-guided liver lesion biopsy for tissue diagnostic purposes. EXAM: ULTRASOUND GUIDED LIVER LESION BIOPSY COMPARISON:  CT the chest, abdomen and pelvis - 02/27/2017 MEDICATIONS: None ANESTHESIA/SEDATION: Fentanyl 100 mcg IV; Versed 2 mg IV Total Moderate Sedation time: 15 minutes. The patient's level of consciousness and vital signs were monitored continuously by radiology nursing throughout the procedure under my direct supervision. COMPLICATIONS: None immediate. PROCEDURE: Informed written consent was obtained from the patient after a discussion of the risks, benefits and alternatives to treatment. The patient understands and consents the procedure. A timeout was performed prior to the initiation of the procedure. Ultrasound scanning  was performed of the right upper abdominal quadrant demonstrates marked heterogeneous appearance of the hepatic parenchyma with nodular contour compatible with findings of cirrhosis demonstrated preceding abdominal CT. No was made of an approximately 1.3 x 1.4 cm hypoechoic lesion within the right lobe of the liver potentially correlating with the ill-defined hypoattenuating lesions seen on preceding abdominal CT image 58, series 7. This lesion as well as the surrounding hepatic parenchyma was targeted for biopsy for tissue diagnostic purposes. The procedure was planned. The right upper abdominal quadrant was prepped and draped in the usual sterile fashion. The overlying soft tissues were anesthetized with 1% lidocaine with epinephrine. A 17 gauge, 6.8 cm co-axial needle was advanced into a peripheral aspect of the lesion. This was followed by 5 core biopsies with an 18 gauge core device under direct ultrasound guidance. The coaxial needle tract was embolized with a small amount of Gel-Foam slurry and superficial hemostasis was obtained with manual compression. Post procedural scanning was negative for definitive area of hemorrhage or additional complication. A dressing was placed. The patient tolerated the procedure well without immediate post procedural complication. IMPRESSION: Technically successful ultrasound guided core needle biopsy of indeterminate lesion within the right lobe of the liver. Electronically Signed   By: Sandi Mariscal M.D.   On: 02/27/2017 17:13   Ir Fluoro Guide Port Insertion Right  Result Date: 02/27/2017 INDICATION: History of metastatic breast cancer, post left-sided mastectomy. In need of durable intravenous access for chemotherapy administration. EXAM: IMPLANTED PORT A CATH PLACEMENT WITH ULTRASOUND AND FLUOROSCOPIC GUIDANCE COMPARISON:  Chest CT - 02/27/2017 MEDICATIONS: Patient is currently admitted to the hospital and receiving intravenous antibiotics.; The antibiotic was  administered within an appropriate time interval prior to skin puncture. ANESTHESIA/SEDATION: Moderate (conscious) sedation was employed during this procedure. A total of Versed 4 mg and Fentanyl 150 mcg was administered intravenously. Moderate Sedation Time: 27 minutes. The patient's level of consciousness and vital signs were monitored continuously by radiology nursing throughout the procedure under my direct supervision. CONTRAST:  None FLUOROSCOPY TIME:  18 seconds (10 mGy) COMPLICATIONS: None immediate. PROCEDURE: The procedure, risks, benefits, and alternatives were explained to the patient. Questions regarding the procedure were encouraged and answered. The patient understands and consents to the procedure. The right neck and chest were prepped with chlorhexidine in a sterile fashion, and a sterile drape was applied covering the operative field. Maximum barrier sterile technique with sterile gowns and gloves were used for the procedure. A timeout was performed prior to the initiation of the procedure. Local anesthesia was provided with 1% lidocaine with epinephrine. After creating a small venotomy incision, a micropuncture kit was utilized to access the internal jugular vein. Real-time ultrasound guidance was utilized for vascular access including the acquisition of a permanent ultrasound image documenting patency of the accessed  vessel. The microwire was utilized to measure appropriate catheter length. A subcutaneous port pocket was then created along the upper chest wall utilizing a combination of sharp and blunt dissection. The pocket was irrigated with sterile saline. A single lumen power injectable port was chosen for placement. The 8 Fr catheter was tunneled from the port pocket site to the venotomy incision. The port was placed in the pocket. The external catheter was trimmed to appropriate length. At the venotomy, an 8 Fr peel-away sheath was placed over a guidewire under fluoroscopic guidance. The  catheter was then placed through the sheath and the sheath was removed. Final catheter positioning was confirmed and documented with a fluoroscopic spot radiograph. The port was accessed with a Huber needle, aspirated and flushed with heparinized saline. The venotomy site was closed with an interrupted 4-0 Vicryl suture. The port pocket incision was closed with interrupted 2-0 Vicryl suture and the skin was opposed with a running subcuticular 4-0 Vicryl suture. Dermabond and Steri-strips were applied to both incisions. Dressings were placed. The patient tolerated the procedure well without immediate post procedural complication. FINDINGS: After catheter placement, the tip lies within the superior cavoatrial junction. The catheter aspirates and flushes normally and is ready for immediate use. IMPRESSION: Successful placement of a right internal jugular approach power injectable Port-A-Cath. The catheter is ready for immediate use. Electronically Signed   By: Sandi Mariscal M.D.   On: 02/27/2017 17:09        Scheduled Meds: . irbesartan  300 mg Oral Daily   And  . amLODipine  10 mg Oral Daily  . loratadine  10 mg Oral Daily  . protein supplement shake  11 oz Oral BID BM   Continuous Infusions: . sodium chloride 75 mL/hr at 02/28/17 0739  . sodium chloride    . sodium chloride    . sodium chloride    . levofloxacin (LEVAQUIN) IV Stopped (02/28/17 1805)     LOS: 5 days    Time spent: 25 minutes    Leisa Lenz, MD [facility] [phone]  If 7PM-7AM, please contact night-coverage [email or web address] Password [facility] 03/01/2017, 12:18 PM

## 2017-03-02 ENCOUNTER — Inpatient Hospital Stay (HOSPITAL_COMMUNITY): Payer: BC Managed Care – PPO

## 2017-03-02 DIAGNOSIS — D72829 Elevated white blood cell count, unspecified: Secondary | ICD-10-CM

## 2017-03-02 LAB — CBC
HEMATOCRIT: 24.1 % — AB (ref 36.0–46.0)
Hemoglobin: 7.8 g/dL — ABNORMAL LOW (ref 12.0–15.0)
MCH: 32.4 pg (ref 26.0–34.0)
MCHC: 32.4 g/dL (ref 30.0–36.0)
MCV: 100 fL (ref 78.0–100.0)
Platelets: 29 10*3/uL — CL (ref 150–400)
RBC: 2.41 MIL/uL — AB (ref 3.87–5.11)
RDW: 23.5 % — ABNORMAL HIGH (ref 11.5–15.5)
WBC: 14.7 10*3/uL — AB (ref 4.0–10.5)

## 2017-03-02 LAB — BASIC METABOLIC PANEL
ANION GAP: 7 (ref 5–15)
BUN: 33 mg/dL — ABNORMAL HIGH (ref 6–20)
CO2: 19 mmol/L — AB (ref 22–32)
Calcium: 8 mg/dL — ABNORMAL LOW (ref 8.9–10.3)
Chloride: 110 mmol/L (ref 101–111)
Creatinine, Ser: 0.95 mg/dL (ref 0.44–1.00)
GFR calc Af Amer: >60 mL/min (ref >60)
GFR calc non Af Amer: >60 mL/min (ref >60)
GLUCOSE: 89 mg/dL (ref 65–99)
POTASSIUM: 4.4 mmol/L (ref 3.5–5.1)
Sodium: 136 mmol/L (ref 135–145)

## 2017-03-02 MED ORDER — HEPARIN SOD (PORK) LOCK FLUSH 100 UNIT/ML IV SOLN
500.0000 [IU] | Freq: Once | INTRAVENOUS | Status: AC
  Administered 2017-03-02: 500 [IU] via INTRAVENOUS
  Filled 2017-03-02: qty 5

## 2017-03-02 MED ORDER — LEVOFLOXACIN 750 MG PO TABS: 750.0000 mg | ORAL_TABLET | Freq: Every day | ORAL | 0 refills | Status: AC

## 2017-03-02 MED ORDER — PREMIER PROTEIN SHAKE: 11.0000 [oz_av] | Freq: Two times a day (BID) | ORAL | 0 refills | Status: AC

## 2017-03-02 MED ORDER — LEVOFLOXACIN 750 MG PO TABS
750.0000 mg | ORAL_TABLET | Freq: Every day | ORAL | Status: DC
  Administered 2017-03-02: 750 mg via ORAL
  Filled 2017-03-02: qty 1

## 2017-03-02 MED ORDER — OXYCODONE HCL 5 MG PO TABS: 5.0000 mg | ORAL_TABLET | ORAL | 0 refills | Status: AC | PRN

## 2017-03-02 NOTE — Discharge Instructions (Signed)
Thrombocytopenia Thrombocytopenia means that you have a low number of platelets in your blood. Platelets are tiny cells in the blood. When you bleed, they clump together at the cut or injury to stop the bleeding. This is called blood clotting. Not having enough platelets can cause bleeding problems. Follow these instructions at home: General instructions  Check your skin and inside your mouth for bruises or blood as told by your doctor.  Check to see if there is blood in your spit (sputum), pee (urine), and poop (stool). Do this as told by your doctor.  Ask your doctor if you can drink alcohol.  Take over-the-counter and prescription medicines only as told by your doctor.  Tell all of your doctors that you have this condition. Be sure to tell your dentist and eye doctor too. Activity  Do not do activities that can cause bumps or bruises until your doctor says it is okay.  Be careful not to cut yourself: ? When you shave. ? When you use scissors, needles, knives, or other tools.  Be careful not to burn yourself: ? When you use an iron. ? When you cook. Contact a doctor if:  You have bruises and you do not know why. Get help right away if:  You are bleeding anywhere on your body.  You have blood in your spit, pee, or poop. This information is not intended to replace advice given to you by your health care provider. Make sure you discuss any questions you have with your health care provider. Document Released: 02/07/2011 Document Revised: 10/22/2015 Document Reviewed: 08/22/2014 Elsevier Interactive Patient Education  2018 [employer] Inc[employer]   

## 2017-03-02 NOTE — Progress Notes (Signed)
SATURATION QUALIFICATIONS: (This note is used to comply with regulatory documentation for home oxygen)  Patient Saturations on Room Air at Rest = 85%  Patient Saturations on Room Air while Ambulating = 85%  Patient Saturations on 2 Liters of oxygen while Ambulating = 98%  Please briefly explain why patient needs home oxygen: Pt desat on room air at rest 85-86%

## 2017-03-02 NOTE — Progress Notes (Addendum)
Referral received to assist pt with [facility] PT, RN, and an aide. Met with pt and mother. Pt plans to return home with the support of her mother. Discussed preference for a [facility] agency. Her mother prefers to use [facility] and her daughter agrees. Mother stated that she used them in the past and they were really nice. Contacted Jermaine at [facility] for referral, but he is not able to accept the referral for [facility] nursing due to staffing issues. Met with pt and mother and explained to them that [facility] is not able to accept the referral. Will call other [facility] agencies and they agreed. Contacted [facility], they are not able to accept the referral due to max capacity. [place] is OON with pt's insurance Loss adjuster, chartered). Per Dian Situ at [facility] he is not able to accept the referral due to pt's insurance. McCoy at [phone] and left a VM. Met with pt and mother again and made them aware that we haven't been able to find a [facility] agency. Will discuss HH needs with CM manager. Mother provided her phone # [phone].  Received referral to assist pt with home O2. Contacted Jermaine at [facility] for DME referral. O2 delivered.

## 2017-03-02 NOTE — Progress Notes (Signed)
PHARMACIST - PHYSICIAN COMMUNICATION DR:  Charlies Silvers CONCERNING: Antibiotic IV to Oral Route Change Policy  RECOMMENDATION: This patient is receiving Levaquin by the intravenous route.  Based on criteria approved by the Pharmacy and Therapeutics Committee, the antibiotic(s) is/are being converted to the equivalent oral dose form(s).   DESCRIPTION: These criteria include:  Patient being treated for a respiratory tract infection, urinary tract infection, cellulitis or clostridium difficile associated diarrhea if on metronidazole  The patient is not neutropenic and does not exhibit a GI malabsorption state  The patient is eating (either orally or via tube) and/or has been taking other orally administered medications for a least 24 hours  The patient is improving clinically and has a Tmax < 100.5  If you have questions about this conversion, please contact the Pharmacy Department  []   [phone] )  [facility] []   [phone] )  [facility] []   [phone] )  [facility] []   [phone] )  [facility] [x]   [phone] )  [place], PharmD, BCPS 03/02/2017@7 :44 AM

## 2017-03-02 NOTE — Discharge Summary (Addendum)
Physician Discharge Summary  Danielle Oliver NTI:[id number] DOB: Jul 13, 1960 DOA: 02/24/2017  PCP: Helane Rima, MD  Admit date: 02/24/2017 Discharge date: 03/02/2017  Recommendations for Outpatient Follow-up:  Follow up with oncology 03/07/2016 Platelets are 29 this am 12/30 amd hgb is 7.8 Continue Levaquin for 3 more days on discharge Needs O2 on discharge 2 L via [place]  Discharge Diagnoses:  Active Problems:   BP (high blood pressure)   Metastases to the liver (HCC)   Thrombocytopenia (HCC)   Metastatic breast cancer (HCC)   Generalized weakness   Goals of care, counseling/discussion   Palliative care encounter   Leukocytosis   Low hemoglobin   Discharge Condition: stable   Diet recommendation: as tolerated   History of present illness:  56 year old African-American female with past medical history of metastatic breast cancer with metastases to liver, bone and lung, morbid obesity who was sent here with a direct admission from [facility]/oncology office.  Patient reported of generalized weakness, fatigue on minimal exertion and 30 pound weight loss in the last month. Patient was found to be anemic, thrombocytopenic and with elevation of bilirubin,  INR and liver enzymes.  Hospital Course:   Metastatic breast cancer: With metastases to liver, bone .Likely lungs also Oncology following.  She will be followed by oncology as an outpatient. Palliative consult also following. S/P liver biopsy and PAC placement here by IR. Patient follows with Dr. Jana Hakim. She was diagnosed with a stage III left breast cancer in 7 when she was 56 years old.  Apparently she had a left mastectomy and 4 cycles of adjuvant chemotherapy at that time. She underwent diagnostic mammogram on 01/12/2014 which showed a 3.6 cm lobulated solid mass in the right upper quadrant of the right breast.  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  Her right axilla lymph node biopsy also showed invasive adenocarcinoma.  She underwent right lumpectomy on 02/17/15. CT scan on 03/18/14 showed multiple sclerotic bone metastases but no visceral disease. She was started on fulvestrant on 12/20/16 but discontinued due to disease progression. At present, cancer has  metastasized to the liver and bone.  Ultrasound of the liver done on 02/17/17 showed innumerable small hypoechoic liver masses the largest measuring 1.8 cm on the left and two-point centimeter on the right.  On this admission: Nuclear bone scan :Evidence of progression of widespread skeletal metastatic disease, with increased activity within the sternum, bilateral ribs and bilateral proximal femurs  CT head :Does not show any brain metastasis but diffuse sclerotic metastatic disease throughout the skull and visible facial bones. No destructive osseous lesion identified  CT abdomen/pelvis/chest:Mixed ground-glass and nodular opacity of the bilateral lungs, predominantly right-sided though involving all lobes.  Multifocal pneumonia versus metastatic disease.Likely this is metastatic disease.  Patient has been started on Levaquin.  She is febrile and her lungs are clear at present. Also showed hepatic metastatic disease.  Acute on chronic respiratory failure with hypoxia - Needs 2 L [place] oxygen support on discharge   Onc follow up appt 03/07/17  Elevated liver enzymes:  Due to liver mets, s/p biopsy  Failure to thrive / Severe protein calorie malnutrition Diet as tolerated   Anemia/severe thrombocytopenia: Most likely secondary to liver metastasis.  Platelets count 16,000 on admission.  S/P 4 units of platelets with improvement to 53000 so she underwent liver biopsy.  Platelets comes down to 28,000 today.  I do  not think she needs transfusion unless her platelets counts falls below 10,000 or she bleeds. Hgb 6.8 on 12/28 -  transfused 2 U PRBC Transfused 1 U plt 12/29 Platelets 29 this am Outpt follow up  Generalized weakness/volume depletion: as tolerated   Hypertension: Stable   Headache /Earache: Supportive care    DVT prophylaxis: SCD's Code Status: Full Family Communication: No family at the bedside this am    Consultants: Oncology,Palliative   Signed:  Leisa Lenz, MD  [facility] 03/02/2017, 11:01 AM  Pager #: [phone]  Time spent in minutes: more than 30 minutes    Discharge Exam: Vitals:   03/01/17 2020 03/02/17 0441  BP: (!) 103/54 (!) 102/56  Pulse: (!) 102 82  Resp: 18 18  Temp: 99.3 F (37.4 C) 99.1 F (37.3 C)  SpO2: 98% 95%   Vitals:   03/01/17 1715 03/01/17 1936 03/01/17 2020 03/02/17 0441  BP: 104/68 134/69 (!) 103/54 (!) 102/56  Pulse: 99 (!) 106 (!) 102 82  Resp: '18 18 18 18  ' Temp: 98.7 F (37.1 C) 98.9 F (37.2 C) 99.3 F (37.4 C) 99.1 F (37.3 C)  TempSrc: Oral Oral Oral Oral  SpO2: 95%  98% 95%  Weight:      Height:        General: Pt is alert, follows commands appropriately, not in acute distress Cardiovascular: Regular rate and rhythm, S1/S2 + Respiratory: Clear to auscultation bilaterally, no wheezing, no crackles, no rhonchi Abdominal: Soft, non tender, non distended, bowel sounds +, no guarding Extremities: no cyanosis, pulses palpable bilaterally DP and PT Neuro: Grossly nonfocal  Discharge Instructions  Discharge Instructions    Call MD for:  persistant nausea and vomiting   Complete by:  As directed    Call MD for:  redness, tenderness, or signs of infection (pain, swelling, redness, odor or green/yellow discharge around incision site)   Complete by:  As directed    Call MD for:  severe uncontrolled pain   Complete by:  As directed    Diet - low sodium heart healthy   Complete by:  As directed    Discharge instructions   Complete by:  As directed    Follow up with oncology 03/07/2016 Platelets are 29 this am  12/30 amd hgb is 7.8 Continue Levaquin for 3 more days on discharge   Increase activity slowly   Complete by:  As directed      Allergies as of 03/02/2017      Reactions   Iodinated Diagnostic Agents Anaphylaxis   Penicillins Rash   Has patient had a PCN reaction causing immediate rash, facial/tongue/throat swelling, SOB or lightheadedness with hypotension: Yes  Has patient had a PCN reaction causing severe rash involving mucus membranes or skin necrosis: No Has patient had a PCN reaction that required hospitalization No Has patient had a PCN reaction occurring within the last 10 years: Yes If all of the above answers are "NO", then may proceed with Cephalosporin use.      Medication List    STOP taking these medications   predniSONE 50 MG tablet Commonly known as:  DELTASONE   XGEVA [place]     TAKE these medications   amLODipine-olmesartan 10-40 MG tablet Commonly known as:  AZOR Take 1 tablet by mouth daily.   CALTRATE 600+D 600-800 MG-UNIT Tabs Generic drug:  Calcium Carb-Cholecalciferol Take by mouth 2 (two) times daily.   levocetirizine 5 MG tablet Commonly known as:  XYZAL Take 5 mg by mouth every  evening.   levofloxacin 750 MG tablet Commonly known as:  LEVAQUIN Take 1 tablet (750 mg total) by mouth daily. Start taking on:  03-31-17   naproxen sodium 220 MG tablet Commonly known as:  ALEVE Take 220 mg by mouth as needed.   oxyCODONE 5 MG immediate release tablet Commonly known as:  Oxy IR/ROXICODONE Take 1 tablet (5 mg total) by mouth every 4 (four) hours as needed for moderate pain or severe pain.   protein supplement shake Liqd Commonly known as:  PREMIER PROTEIN Take 325 mLs (11 oz total) by mouth 2 (two) times daily between meals.      Follow-up Information    Helane Rima, MD. Schedule an appointment as soon as possible for a visit in 1 week(s).   Specialty:  Family Medicine Contact information: [place]  [id number] [phone]            The results of significant diagnostics from this hospitalization (including imaging, microbiology, ancillary and laboratory) are listed below for reference.    Significant Diagnostic Studies: Ct Head W & Wo Contrast  Result Date: 02/27/2017 CLINICAL DATA:  56 year old female. Metastatic breast cancer staging. EXAM: CT HEAD WITHOUT AND WITH CONTRAST TECHNIQUE: Contiguous axial images were obtained from the base of the skull through the vertex without and with intravenous contrast CONTRAST:  13 hour steroid premedication. Contrast tolerated with no adverse events. 151m ISOVUE-300 IOPAMIDOL (ISOVUE-300) INJECTION 61% in conjunction with contrast enhanced imaging of the chest, abdomen, and pelvis reported separately. COMPARISON:  Skull radiographs 06/12/2016. FINDINGS: Brain: Partially empty sella. Cerebral volume within normal limits. No midline shift, ventriculomegaly, mass effect, evidence of mass lesion, intracranial hemorrhage or evidence of cortically based acute infarction. Gray-white matter differentiation is within normal limits throughout the brain. No abnormal enhancement identified.  No encephalomalacia identified. Vascular: The major intracranial vascular structures appear to be enhancing and patent. Skull: Widespread sclerotic osseous metastatic disease. Discrete sclerotic lesions most apparent at the right lower clivus, and in both frontal bones (e.g. Series 14, image 17). No destructive osseous lesion identified. Sinuses/Orbits: Paranasal sinuses are well pneumatized. Left greater than right mastoid effusions. Tympanic cavities remain clear. Other: Leftward gaze deviation. Otherwise negative visible orbit and scalp soft tissues. IMPRESSION: 1. Diffuse sclerotic metastatic disease throughout the skull and visible facial bones. No destructive osseous lesion identified. 2. No metastatic disease to the brain identified; negative CT appearance of the brain.  Electronically Signed   By: HGenevie AnnM.D.   On: 02/27/2017 08:58   Ct Chest W Contrast  Result Date: 02/27/2017 CLINICAL DATA:  56year old female with a history of metastatic breast cancer. EXAM: CT CHEST, ABDOMEN, AND PELVIS WITH CONTRAST TECHNIQUE: Multidetector CT imaging of the chest, abdomen and pelvis was performed following the standard protocol during bolus administration of intravenous contrast. CONTRAST:  1079mISOVUE-300 IOPAMIDOL (ISOVUE-300) INJECTION 61% COMPARISON:  Ultrasound 02/17/2017, CT 01/03/2017, 07/17/2016, 02/29/2016, 02/24/2015 FINDINGS: CT CHEST FINDINGS Cardiovascular: Heart size within normal limits. No pericardial fluid/ thickening. Minimal calcifications of the mitral annulus and in the distribution of the circumflex coronary artery. Unremarkable course caliber and contour of the thoracic aorta. Unremarkable diameter of the main pulmonary artery. Mediastinum/Nodes: Unremarkable appearance of the thoracic inlet. No mediastinal lymphadenopathy. Unremarkable course of the thoracic esophagus. Lungs/Pleura: Mixed ground-glass and nodular opacity of the bilateral lungs, predominantly right-sided though involving all lobes. No endotracheal or endobronchial debris. Pattern of opacity predominantly within the distal peribronchovascular distribution. Trace left-sided pleural effusion. CT  ABDOMEN PELVIS FINDINGS Hepatobiliary: Hepatomegaly, with the greatest cranial caudal diameter of the right liver measuring 24 cm. This 2 phase contrast-enhanced CT demonstrates no focal mass within the liver, however, in overall heterogeneous pattern of enhancement/ attenuation with a nodular contour. When compared to prior CT of 2016, the overall size measures slightly larger, with increase nodular contour. Cholelithiasis.  No evidence of inflammatory changes. Pancreas: Unremarkable pancreas Spleen: Cranial caudal span of spleen measures 14.4 cm Adrenals/Urinary Tract: Unremarkable appearance of the  kidneys with no hydronephrosis or nephrolithiasis. No inflammatory changes. Unremarkable course of the bilateral ureters. Unremarkable urinary bladder. Stomach/Bowel: Unremarkable stomach. Unremarkable small bowel appear read Normal appendix. Colonic diverticulae without evidence of acute inflammatory changes. Vascular/Lymphatic: Scattered calcifications of the abdominal aorta. No adenopathy. Reproductive: Unremarkable appearance of the uterus and the adnexa Other: Fat containing umbilical hernia. Musculoskeletal: Widespread metastatic disease of the axial and appendicular skeleton. All of the thoracic and lumbar vertebral bodies demonstrate sclerotic/blastic changes. Degenerative changes are present. No acute pathologic fracture of the thoracic or lumbar levels. Diffuse sclerotic changes of the bilateral hemipelvis and the sacrum. Sclerotic changes of the sternum. Sclerotic changes of the visualized bilateral humerus, bilateral clavicles, and throughout all the bilateral ribs. Sclerotic changes of the proximal bilateral femurs. Surgical changes of left mastectomy. Surgical changes of the right axilla. IMPRESSION: Interval development of multifocal pneumonia. It is uncertain if the previously identified lung nodules on the CT 01/03/2017 represented early inflammatory/infectious changes, or if these represent pulmonary metastases. Attention on future imaging may be useful. Trace left pleural fluid. Widespread skeletal metastases throughout the visualized axial and appendicular skeleton. No pathologic fracture identified. 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 Splenomegaly Cholelithiasis without evidence of acute cholecystitis. Diverticular disease without evidence of acute diverticulitis. Fat containing umbilical hernia. Electronically Signed   By:  Corrie Mckusick D.O.   On: 02/27/2017 09:12   Nm Bone Scan Whole Body  Result Date: 02/26/2017 CLINICAL DATA:  History of breast cancer with bony metastases. EXAM: NUCLEAR MEDICINE WHOLE BODY BONE SCAN TECHNIQUE: Whole body anterior and posterior images were obtained approximately 3 hours after intravenous injection of radiopharmaceutical. RADIOPHARMACEUTICALS:  21.0 mCi Technetium-73mMDP IV COMPARISON:  02/28/2016 FINDINGS: Normal soft tissue and renal uptake. Abnormal radiotracer uptake within the sternum, clavicles, bilateral humeral heads, bilateral proximal femurs, sacrum, bilateral medial ileal bones, numerous thoracic vertebral bodies, and numerous bilateral ribs. Abnormal heterogeneous radiotracer uptake within the calvarium. Compared to the prior study the radiotracer activity within the sternum, bilateral ribs, and bilateral proximal femurs have increased. IMPRESSION: Evidence of progression of widespread skeletal metastatic disease, with increased activity within the sternum, bilateral ribs and bilateral proximal femurs. Electronically Signed   By: DFidela SalisburyM.D.   On: 02/26/2017 15:36   UKoreaAbdomen Complete  Result Date: 02/17/2017 CLINICAL DATA:  Right upper quadrant abdominal pain and nausea for 3 days. History of breast cancer. EXAM: ABDOMEN ULTRASOUND COMPLETE COMPARISON:  03/18/2014 CT abdomen/ pelvis. FINDINGS: Gallbladder: Cholelithiasis, with shadowing calcified gallstones measuring up to 2.7 cm. Mild diffuse gallbladder wall thickening. No pericholecystic fluid. Sonographic MPercell Millersign is present. Common bile duct: Diameter: 3 mm Liver: Hepatomegaly. Liver parenchyma is markedly heterogeneous, with the suggestion of innumerable small hypoechoic liver masses throughout the liver, largest 1.8 x 1.6 x 1.6 cm in the left liver lobe and 2.1 x 1.5 x 1.7 cm in the posterior right liver  lobe. Portal vein is patent on color Doppler imaging with normal direction of blood flow towards the  liver. IVC: No abnormality visualized. Pancreas: Visualized portion unremarkable. Spleen: Size and appearance within normal limits. Right Kidney: Length: 10.0 cm. Echogenicity within normal limits. No mass or hydronephrosis visualized. Left Kidney: Length: 10.4 cm. Echogenicity within normal limits. No mass or hydronephrosis visualized. Abdominal aorta: No aneurysm visualized. Other findings: None. IMPRESSION: 1. Cholelithiasis. Mild diffuse gallbladder wall thickening. Sonographic Percell Miller sign is present. Findings are compatible with acute calculous cholecystitis in the correct clinical setting. 2. No biliary ductal dilatation. 3. Hepatomegaly with markedly heterogeneous liver parenchyma, with the suggestion of innumerable small hypoechoic liver masses throughout the liver, worrisome for hepatic metastases given the history of breast cancer. Recommend further evaluation with either staging CT of the chest, abdomen and pelvis with IV and oral contrast, MRI abdomen without and with IV contrast or PET-CT, as clinically warranted. These results will be called to the ordering clinician or representative by the Radiologist Assistant, and communication documented in the PACS or zVision Dashboard. Electronically Signed   By: Ilona Sorrel M.D.   On: 02/17/2017 17:30   Ct Abdomen Pelvis W Contrast  Result Date: 02/27/2017 CLINICAL DATA:  56 year old female with a history of metastatic breast cancer. EXAM: CT CHEST, ABDOMEN, AND PELVIS WITH CONTRAST TECHNIQUE: Multidetector CT imaging of the chest, abdomen and pelvis was performed following the standard protocol during bolus administration of intravenous contrast. CONTRAST:  179m ISOVUE-300 IOPAMIDOL (ISOVUE-300) INJECTION 61% COMPARISON:  Ultrasound 02/17/2017, CT 01/03/2017, 07/17/2016, 02/29/2016, 02/24/2015 FINDINGS: CT CHEST FINDINGS Cardiovascular: Heart size within normal limits. No pericardial fluid/ thickening. Minimal calcifications of the mitral annulus and in  the distribution of the circumflex coronary artery. Unremarkable course caliber and contour of the thoracic aorta. Unremarkable diameter of the main pulmonary artery. Mediastinum/Nodes: Unremarkable appearance of the thoracic inlet. No mediastinal lymphadenopathy. Unremarkable course of the thoracic esophagus. Lungs/Pleura: Mixed ground-glass and nodular opacity of the bilateral lungs, predominantly right-sided though involving all lobes. No endotracheal or endobronchial debris. Pattern of opacity predominantly within the distal peribronchovascular distribution. Trace left-sided pleural effusion. CT ABDOMEN PELVIS FINDINGS Hepatobiliary: Hepatomegaly, with the greatest cranial caudal diameter of the right liver measuring 24 cm. This 2 phase contrast-enhanced CT demonstrates no focal mass within the liver, however, in overall heterogeneous pattern of enhancement/ attenuation with a nodular contour. When compared to prior CT of 2016, the overall size measures slightly larger, with increase nodular contour. Cholelithiasis.  No evidence of inflammatory changes. Pancreas: Unremarkable pancreas Spleen: Cranial caudal span of spleen measures 14.4 cm Adrenals/Urinary Tract: Unremarkable appearance of the kidneys with no hydronephrosis or nephrolithiasis. No inflammatory changes. Unremarkable course of the bilateral ureters. Unremarkable urinary bladder. Stomach/Bowel: Unremarkable stomach. Unremarkable small bowel appear read Normal appendix. Colonic diverticulae without evidence of acute inflammatory changes. Vascular/Lymphatic: Scattered calcifications of the abdominal aorta. No adenopathy. Reproductive: Unremarkable appearance of the uterus and the adnexa Other: Fat containing umbilical hernia. Musculoskeletal: Widespread metastatic disease of the axial and appendicular skeleton. All of the thoracic and lumbar vertebral bodies demonstrate sclerotic/blastic changes. Degenerative changes are present. No acute pathologic  fracture of the thoracic or lumbar levels. Diffuse sclerotic changes of the bilateral hemipelvis and the sacrum. Sclerotic changes of the sternum. Sclerotic changes of the visualized bilateral humerus, bilateral clavicles, and throughout all the bilateral ribs. Sclerotic changes of the proximal bilateral femurs. Surgical changes of left mastectomy. Surgical changes of the right axilla. IMPRESSION: Interval development of multifocal pneumonia. It is uncertain  if the previously identified lung nodules on the CT 01/03/2017 represented early inflammatory/infectious changes, or if these represent pulmonary metastases. Attention on future imaging may be useful. Trace left pleural fluid. Widespread skeletal metastases throughout the visualized axial and appendicular skeleton. No pathologic fracture identified. 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 Splenomegaly Cholelithiasis without evidence of acute cholecystitis. Diverticular disease without evidence of acute diverticulitis. Fat containing umbilical hernia. Electronically Signed   By: Corrie Mckusick D.O.   On: 02/27/2017 09:12   Ir US Guide Vasc Access Right  Result Date: 02/27/2017 INDICATION: History of metastatic breast cancer, post left-sided mastectomy. In need of durable intravenous access for chemotherapy administration. EXAM: IMPLANTED PORT A CATH PLACEMENT WITH ULTRASOUND AND FLUOROSCOPIC GUIDANCE COMPARISON:  Chest CT - 02/27/2017 MEDICATIONS: Patient is currently admitted to the hospital and receiving intravenous antibiotics.; The antibiotic was administered within an appropriate time interval prior to skin puncture. ANESTHESIA/SEDATION: Moderate (conscious) sedation was employed during this procedure. A total of Versed 4 mg and Fentanyl 150 mcg was administered intravenously. Moderate  Sedation Time: 27 minutes. The patient's level of consciousness and vital signs were monitored continuously by radiology nursing throughout the procedure under my direct supervision. CONTRAST:  None FLUOROSCOPY TIME:  18 seconds (10 mGy) COMPLICATIONS: None immediate. PROCEDURE: The procedure, risks, benefits, and alternatives were explained to the patient. Questions regarding the procedure were encouraged and answered. The patient understands and consents to the procedure. The right neck and chest were prepped with chlorhexidine in a sterile fashion, and a sterile drape was applied covering the operative field. Maximum barrier sterile technique with sterile gowns and gloves were used for the procedure. A timeout was performed prior to the initiation of the procedure. Local anesthesia was provided with 1% lidocaine with epinephrine. After creating a small venotomy incision, a micropuncture kit was utilized to access the internal jugular vein. Real-time ultrasound guidance was utilized for vascular access including the acquisition of a permanent ultrasound image documenting patency of the accessed vessel. The microwire was utilized to measure appropriate catheter length. A subcutaneous port pocket was then created along the upper chest wall utilizing a combination of sharp and blunt dissection. The pocket was irrigated with sterile saline. A single lumen power injectable port was chosen for placement. The 8 Fr catheter was tunneled from the port pocket site to the venotomy incision. The port was placed in the pocket. The external catheter was trimmed to appropriate length. At the venotomy, an 8 Fr peel-away sheath was placed over a guidewire under fluoroscopic guidance. The catheter was then placed through the sheath and the sheath was removed. Final catheter positioning was confirmed and documented with a fluoroscopic spot radiograph. The port was accessed with a Huber needle, aspirated and flushed with heparinized  saline. The venotomy site was closed with an interrupted 4-0 Vicryl suture. The port pocket incision was closed with interrupted 2-0 Vicryl suture and the skin was opposed with a running subcuticular 4-0 Vicryl suture. Dermabond and Steri-strips were applied to both incisions. Dressings were placed. The patient tolerated the procedure well without immediate post procedural complication. FINDINGS: After catheter placement, the tip lies within the superior cavoatrial junction. The catheter aspirates and flushes normally and is ready for immediate use. IMPRESSION: Successful placement of a right internal jugular approach power injectable Port-A-Cath. The catheter is ready for immediate use. Electronically Signed   By: Jenny Reichmann  Watts M.D.   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 Please perform ultrasound-guided liver lesion biopsy for tissue diagnostic purposes. EXAM: ULTRASOUND GUIDED LIVER LESION BIOPSY COMPARISON:  CT the chest, abdomen and pelvis - 02/27/2017 MEDICATIONS: None ANESTHESIA/SEDATION: Fentanyl 100 mcg IV; Versed 2 mg IV Total Moderate Sedation time: 15 minutes. The patient's level of consciousness and vital signs were monitored continuously by radiology nursing throughout the procedure under my direct supervision. COMPLICATIONS: None immediate. PROCEDURE: Informed written consent was obtained from the patient after a discussion of the risks, benefits and alternatives to treatment. The patient understands and consents the procedure. A timeout was performed prior to the initiation of the procedure. Ultrasound scanning was performed of the right upper abdominal quadrant demonstrates marked heterogeneous appearance of the hepatic parenchyma with nodular contour compatible with findings of cirrhosis demonstrated preceding  abdominal CT. No was made of an approximately 1.3 x 1.4 cm hypoechoic lesion within the right lobe of the liver potentially correlating with the ill-defined hypoattenuating lesions seen on preceding abdominal CT image 58, series 7. This lesion as well as the surrounding hepatic parenchyma was targeted for biopsy for tissue diagnostic purposes. The procedure was planned. The right upper abdominal quadrant was prepped and draped in the usual sterile fashion. The overlying soft tissues were anesthetized with 1% lidocaine with epinephrine. A 17 gauge, 6.8 cm co-axial needle was advanced into a peripheral aspect of the lesion. This was followed by 5 core biopsies with an 18 gauge core device under direct ultrasound guidance. The coaxial needle tract was embolized with a small amount of Gel-Foam slurry and superficial hemostasis was obtained with manual compression. Post procedural scanning was negative for definitive area of hemorrhage or additional complication. A dressing was placed. The patient tolerated the procedure well without immediate post procedural complication. IMPRESSION: Technically successful ultrasound guided core needle biopsy of indeterminate lesion within the right lobe of the liver. Electronically Signed   By: Sandi Mariscal M.D.   On: 02/27/2017 17:13   Ir Fluoro Guide Port Insertion Right  Result Date: 02/27/2017 INDICATION: History of metastatic breast cancer, post left-sided mastectomy. In need of durable intravenous access for chemotherapy administration. EXAM: IMPLANTED PORT A CATH PLACEMENT WITH ULTRASOUND AND FLUOROSCOPIC GUIDANCE COMPARISON:  Chest CT - 02/27/2017 MEDICATIONS: Patient is currently admitted to the hospital and receiving intravenous antibiotics.; The antibiotic was administered within an appropriate time interval prior to skin puncture. ANESTHESIA/SEDATION: Moderate (conscious) sedation was employed during this procedure. A total of Versed 4 mg and Fentanyl 150 mcg was  administered intravenously. Moderate Sedation Time: 27 minutes. The patient's level of consciousness and vital signs were monitored continuously by radiology nursing throughout the procedure under my direct supervision. CONTRAST:  None FLUOROSCOPY TIME:  18 seconds (10 mGy) COMPLICATIONS: None immediate. PROCEDURE: The procedure, risks, benefits, and alternatives were explained to the patient. Questions regarding the procedure were encouraged and answered. The patient understands and consents to the procedure. The right neck and chest were prepped with chlorhexidine in a sterile fashion, and a sterile drape was applied covering the operative field. Maximum barrier sterile technique with sterile gowns and gloves were used for the procedure. A timeout was performed prior to the initiation of the procedure. Local anesthesia was provided with 1% lidocaine with epinephrine. After creating a small venotomy incision, a micropuncture kit was utilized to  access the internal jugular vein. Real-time ultrasound guidance was utilized for vascular access including the acquisition of a permanent ultrasound image documenting patency of the accessed vessel. The microwire was utilized to measure appropriate catheter length. A subcutaneous port pocket was then created along the upper chest wall utilizing a combination of sharp and blunt dissection. The pocket was irrigated with sterile saline. A single lumen power injectable port was chosen for placement. The 8 Fr catheter was tunneled from the port pocket site to the venotomy incision. The port was placed in the pocket. The external catheter was trimmed to appropriate length. At the venotomy, an 8 Fr peel-away sheath was placed over a guidewire under fluoroscopic guidance. The catheter was then placed through the sheath and the sheath was removed. Final catheter positioning was confirmed and documented with a fluoroscopic spot radiograph. The port was accessed with a Huber needle,  aspirated and flushed with heparinized saline. The venotomy site was closed with an interrupted 4-0 Vicryl suture. The port pocket incision was closed with interrupted 2-0 Vicryl suture and the skin was opposed with a running subcuticular 4-0 Vicryl suture. Dermabond and Steri-strips were applied to both incisions. Dressings were placed. The patient tolerated the procedure well without immediate post procedural complication. FINDINGS: After catheter placement, the tip lies within the superior cavoatrial junction. The catheter aspirates and flushes normally and is ready for immediate use. IMPRESSION: Successful placement of a right internal jugular approach power injectable Port-A-Cath. The catheter is ready for immediate use. Electronically Signed   By: Sandi Mariscal M.D.   On: 02/27/2017 17:09    Microbiology: Recent Results (from the past 240 hour(s))  TECHNOLOGIST REVIEW     Status: None   Collection Time: 02/24/17 10:49 AM  Result Value Ref Range Status   Technologist Review Metas and Myelocytes,2% blasts, 26% nrbcs present  Final     Labs: Basic Metabolic Panel: Recent Labs  Lab 02/24/17 1159 02/24/17 1754 02/25/17 0522 02/26/17 0358 02/28/17 0610 03/02/17 0500  NA 138  --  137 136 137 136  K 4.9  --  4.7 4.6 4.3 4.4  CL  --   --  110 110 110 110  CO2 18*  --  20* 19* 19* 19*  GLUCOSE 116  --  94 98 138* 89  BUN 21.4  --  19 19 24* 33*  CREATININE 1.2*  --  0.90 1.10* 0.93 0.95  CALCIUM 8.7  --  8.2* 8.1* 7.7* 8.0*  MG  --  2.0  --   --   --   --   PHOS  --  2.6  --   --   --   --    Liver Function Tests: Recent Labs  Lab 02/24/17 1159 02/25/17 0522 02/28/17 0610  AST 107* 108* 93*  ALT 59* 54 55*  ALKPHOS 467* 366* 343*  BILITOT 11.98* 11.9* 14.8*  PROT 7.1 6.1* 6.0*  ALBUMIN 2.3* 2.1* 2.1*   No results for input(s): LIPASE, AMYLASE in the last 168 hours. No results for input(s): AMMONIA in the last 168 hours. CBC: Recent Labs  Lab 02/24/17 1049  02/26/17 0358  02/27/17 0540 02/28/17 0610 03/01/17 0500 03/02/17 0500  WBC 10.8*   < > 14.4* 18.1* 15.9* 17.3* 14.7*  NEUTROABS 6.6*  --  8.5* 11.4* 10.8* 8.8*  --   HGB 9.3*   < > 7.3* 7.8* 6.8* 8.4* 7.8*  HCT 31.0*   < > 23.2* 25.4* 21.7* 26.0* 24.1*  MCV 106.5*   < >  106.4* 105.4* 105.9* 99.6 100.0  PLT 14*   < > 49* 53* 28* 14* 29*   < > = values in this interval not displayed.   Cardiac Enzymes: No results for input(s): CKTOTAL, CKMB, CKMBINDEX, TROPONINI in the last 168 hours. BNP: BNP (last 3 results) No results for input(s): BNP in the last 8760 hours.  ProBNP (last 3 results) No results for input(s): PROBNP in the last 8760 hours.  CBG: No results for input(s): GLUCAP in the last 168 hours.

## 2017-03-03 ENCOUNTER — Ambulatory Visit (HOSPITAL_COMMUNITY): Payer: BC Managed Care – PPO

## 2017-03-03 ENCOUNTER — Other Ambulatory Visit (HOSPITAL_COMMUNITY): Payer: BC Managed Care – PPO

## 2017-03-03 ENCOUNTER — Other Ambulatory Visit: Payer: Self-pay | Admitting: Adult Health

## 2017-03-03 ENCOUNTER — Telehealth: Payer: Self-pay | Admitting: *Deleted

## 2017-03-03 LAB — PREPARE PLATELET PHERESIS: UNIT DIVISION: 0

## 2017-03-03 LAB — BPAM PLATELET PHERESIS
Blood Product Expiration Date: 201812301549
ISSUE DATE / TIME: 201812291650
Unit Type and Rh: 7300

## 2017-03-04 NOTE — Telephone Encounter (Signed)
FYI "[place] with [employer]. Paramedics.  We're in the home.  CPR performed but patient is deceased.  Calling to ask if provider will sign certificate."  Dr. Lindi Adie is POD 3 partner for Dr. Jana Hakim informed triage he will sign.

## 2017-03-04 NOTE — Telephone Encounter (Signed)
All appt's canceled

## 2017-03-04 DEATH — deceased

## 2017-03-05 ENCOUNTER — Ambulatory Visit (HOSPITAL_COMMUNITY): Payer: BC Managed Care – PPO

## 2017-03-06 ENCOUNTER — Other Ambulatory Visit: Payer: Self-pay | Admitting: Oncology

## 2017-03-07 ENCOUNTER — Ambulatory Visit: Payer: BC Managed Care – PPO | Admitting: Oncology

## 2017-03-07 ENCOUNTER — Ambulatory Visit: Payer: BC Managed Care – PPO

## 2017-03-07 ENCOUNTER — Other Ambulatory Visit: Payer: BC Managed Care – PPO

## 2017-03-13 ENCOUNTER — Ambulatory Visit: Payer: BC Managed Care – PPO | Admitting: Oncology

## 2017-03-13 ENCOUNTER — Ambulatory Visit: Payer: BC Managed Care – PPO

## 2017-03-14 ENCOUNTER — Other Ambulatory Visit: Payer: BC Managed Care – PPO

## 2017-03-14 ENCOUNTER — Ambulatory Visit: Payer: BC Managed Care – PPO | Admitting: Oncology

## 2017-03-14 ENCOUNTER — Ambulatory Visit: Payer: BC Managed Care – PPO

## 2017-03-17 ENCOUNTER — Telehealth: Payer: Self-pay | Admitting: *Deleted

## 2017-03-17 NOTE — Telephone Encounter (Signed)
Death certificate signed per need for cremation. Copy given to [facility] in medical records.

## 2017-03-22 ENCOUNTER — Other Ambulatory Visit: Payer: Self-pay | Admitting: Nurse Practitioner

## 2017-04-11 ENCOUNTER — Other Ambulatory Visit: Payer: BC Managed Care – PPO

## 2017-04-11 ENCOUNTER — Ambulatory Visit: Payer: BC Managed Care – PPO

## 2017-05-09 ENCOUNTER — Ambulatory Visit: Payer: BC Managed Care – PPO

## 2017-05-09 ENCOUNTER — Other Ambulatory Visit: Payer: BC Managed Care – PPO

## 2017-05-26 ENCOUNTER — Encounter: Payer: Self-pay | Admitting: *Deleted

## 2017-05-26 NOTE — Progress Notes (Unsigned)
Received from [employer] - forms for insurance benefits per [employer]. Placed in inbox for HIM/insurance pick up.

## 2017-06-04 ENCOUNTER — Telehealth: Payer: Self-pay

## 2017-06-04 NOTE — Telephone Encounter (Signed)
Noted FMLA and insurance paperwork was completed and signed by Dr Jana Hakim.  Pt's mother Bernerd Pho called office, left VM and asked [place] if paper work is ready- I returned her call, put in brown envelope and let her know - she said will be her in 15 min,  will pick up at front desk of cancer center.

## 2017-06-06 ENCOUNTER — Ambulatory Visit: Payer: BC Managed Care – PPO

## 2017-06-06 ENCOUNTER — Other Ambulatory Visit: Payer: BC Managed Care – PPO

## 2017-07-04 ENCOUNTER — Other Ambulatory Visit: Payer: BC Managed Care – PPO

## 2017-07-04 ENCOUNTER — Ambulatory Visit: Payer: BC Managed Care – PPO

## 2019-01-27 IMAGING — NM NM BONE WHOLE BODY
2 series · 2 of 2 positions shown · non-contrast
Comparison: 02/28/2016

CLINICAL DATA: History of breast cancer with bony metastases.

EXAM:
NUCLEAR MEDICINE WHOLE BODY BONE SCAN
TECHNIQUE: Whole body anterior and posterior images were obtained approximately
3 hours after intravenous injection of radiopharmaceutical.
RADIOPHARMACEUTICALS:  21.0 mCi Oechnetium-DDm MDP IV

[Series 1: wbr_bone_40 whole body · 2.66mm/px · 1 of 1 slices shown (1 of 2)]
[im 1/1]
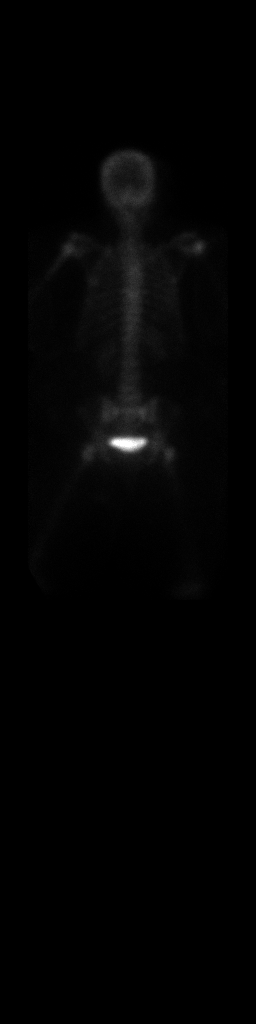

[Series 1: wbr_bone_40 whole body · 2.66mm/px · 1 of 1 slices shown (2 of 2)]
[im 1/1]
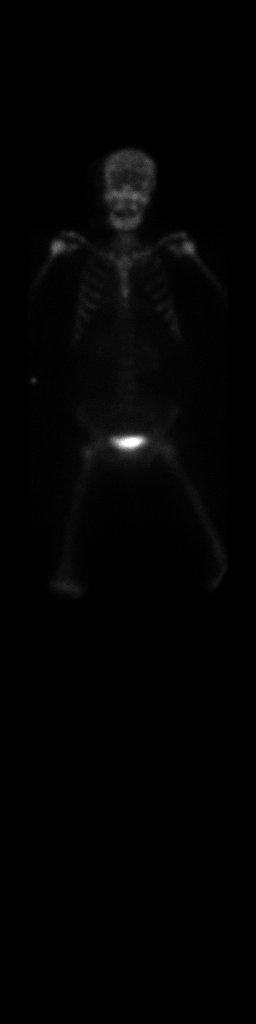

[2 of 2 positions shown; findings below may reference images not displayed]

FINDINGS: Normal soft tissue and renal uptake.

Abnormal radiotracer uptake within the sternum, clavicles, bilateral
humeral heads, bilateral proximal femurs, sacrum, bilateral medial
ileal bones, numerous thoracic vertebral bodies, and numerous
bilateral ribs. Abnormal heterogeneous radiotracer uptake within the
calvarium.

Compared to the prior study the radiotracer activity within the
sternum, bilateral ribs, and bilateral proximal femurs have
increased.
IMPRESSION: Evidence of progression of widespread skeletal metastatic disease,
with increased activity within the sternum, bilateral ribs and
bilateral proximal femurs.

## 2019-01-28 IMAGING — US IR US GUIDE VASC ACCESS RIGHT
1 series · 1 of 1 positions shown · non-contrast
Comparison: Chest CT - 02/27/2017

INDICATION: History of metastatic breast cancer, post left-sided mastectomy. In
need of durable intravenous access for chemotherapy administration.

EXAM:
IMPLANTED PORT A CATH PLACEMENT WITH ULTRASOUND AND FLUOROSCOPIC
GUIDANCE

[Series 1: ir fluoro/shunt/fist · 1 of 1 slices shown]
[im 1/1]
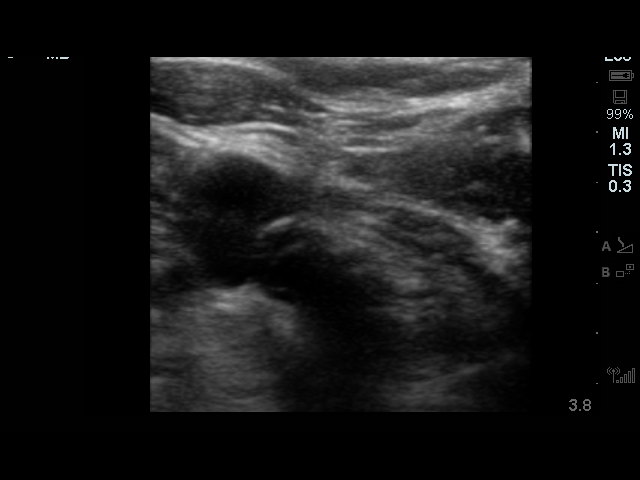

[1 of 1 positions shown; findings below may reference images not displayed]

MEDICATIONS:
Patient is currently admitted to the hospital and receiving
intravenous antibiotics.; The antibiotic was administered within an
appropriate time interval prior to skin puncture.

ANESTHESIA/SEDATION:
Moderate (conscious) sedation was employed during this procedure. A
total of Versed 4 mg and Fentanyl 150 mcg was administered
intravenously.

Moderate Sedation Time: 27 minutes. The patient's level of
consciousness and vital signs were monitored continuously by
radiology nursing throughout the procedure under my direct
supervision.

CONTRAST:  None

FLUOROSCOPY TIME:  18 seconds (10 mGy)

COMPLICATIONS:
None immediate.

PROCEDURE:
The procedure, risks, benefits, and alternatives were explained to
the patient. Questions regarding the procedure were encouraged and
answered. The patient understands and consents to the procedure.

The right neck and chest were prepped with chlorhexidine in a
sterile fashion, and a sterile drape was applied covering the
operative field. Maximum barrier sterile technique with sterile
gowns and gloves were used for the procedure. A timeout was
performed prior to the initiation of the procedure. Local anesthesia
was provided with 1% lidocaine with epinephrine.

After creating a small venotomy incision, a micropuncture kit was
utilized to access the internal jugular vein. Real-time ultrasound
guidance was utilized for vascular access including the acquisition
of a permanent ultrasound image documenting patency of the accessed
vessel. The microwire was utilized to measure appropriate catheter
length.

A subcutaneous port pocket was then created along the upper chest
wall utilizing a combination of sharp and blunt dissection. The
pocket was irrigated with sterile saline. A single lumen power
injectable port was chosen for placement. The 8 Fr catheter was
tunneled from the port pocket site to the venotomy incision. The
port was placed in the pocket. The external catheter was trimmed to
appropriate length. At the venotomy, an 8 Fr peel-away sheath was
placed over a guidewire under fluoroscopic guidance. The catheter
was then placed through the sheath and the sheath was removed. Final
catheter positioning was confirmed and documented with a
fluoroscopic spot radiograph. The port was accessed with Gali Berrones
needle, aspirated and flushed with heparinized saline.

The venotomy site was closed with an interrupted 4-0 Vicryl suture.
The port pocket incision was closed with interrupted 2-0 Vicryl
suture and the skin was opposed with a running subcuticular 4-0
Vicryl suture. Dermabond and [place] were applied to both
incisions. Dressings were placed. The patient tolerated the
procedure well without immediate post procedural complication.
FINDINGS: After catheter placement, the tip lies within the superior
cavoatrial junction. The catheter aspirates and flushes normally and
is ready for immediate use.
IMPRESSION: Successful placement of a right internal jugular approach power
injectable Port-A-Cath. The catheter is ready for immediate use.

## 2019-01-28 IMAGING — US IR US GUIDANCE
1 series · 15 of 15 positions shown · non-contrast
Comparison: CT the chest, abdomen and pelvis - 02/27/2017

INDICATION: History of metastatic breast cancer, now with CT findings worrisome
for pseudo cirrhosis and infiltrative disease involving the liver.
Please perform ultrasound-guided liver lesion biopsy for tissue
diagnostic purposes.

EXAM:
ULTRASOUND GUIDED LIVER LESION BIOPSY

[Series 1: ir (id) (id)/(id)/(id) · 15 of 15 slices shown]
[im 1/15]
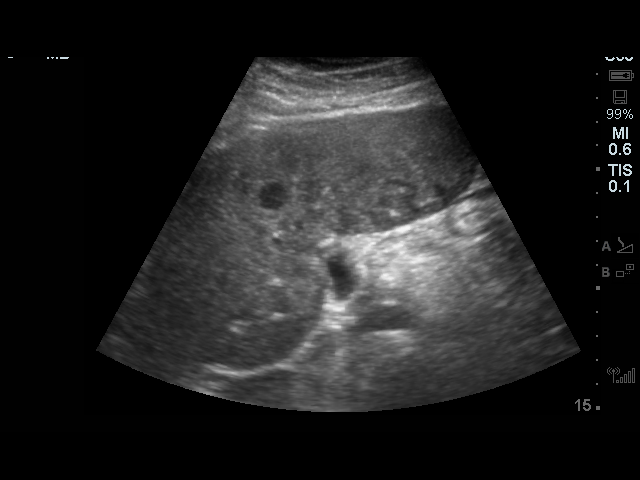
[im 2/15]
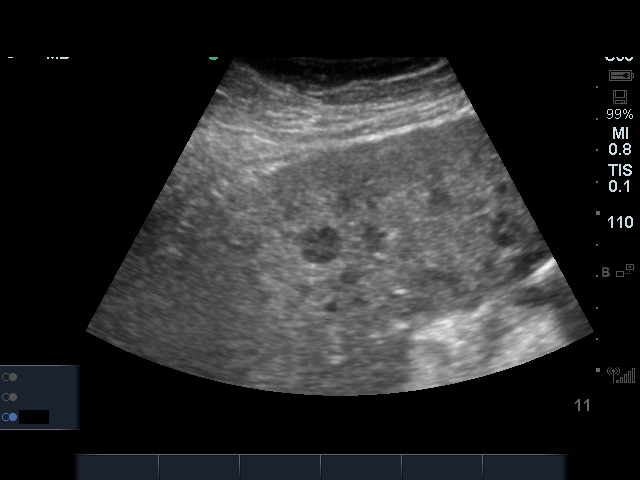
[im 3/15]
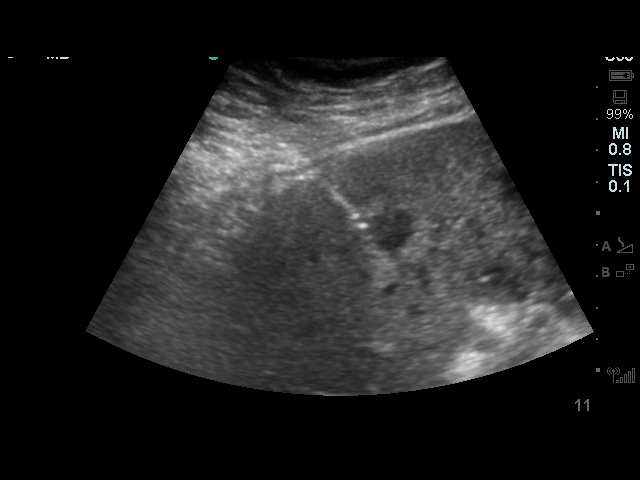
[im 4/15]
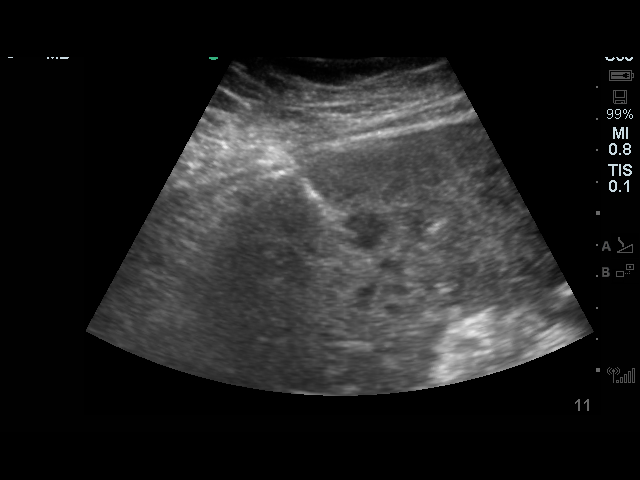
[im 5/15]
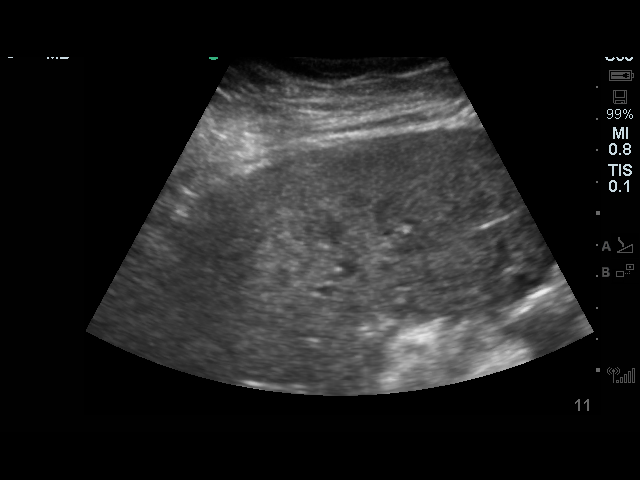
[im 6/15]
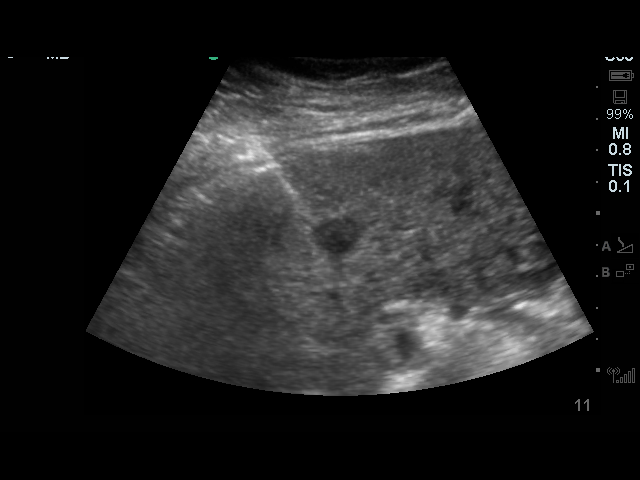
[im 7/15]
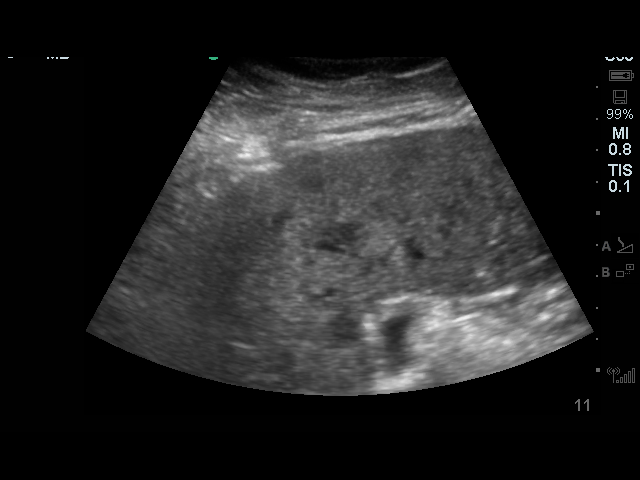
[im 8/15]
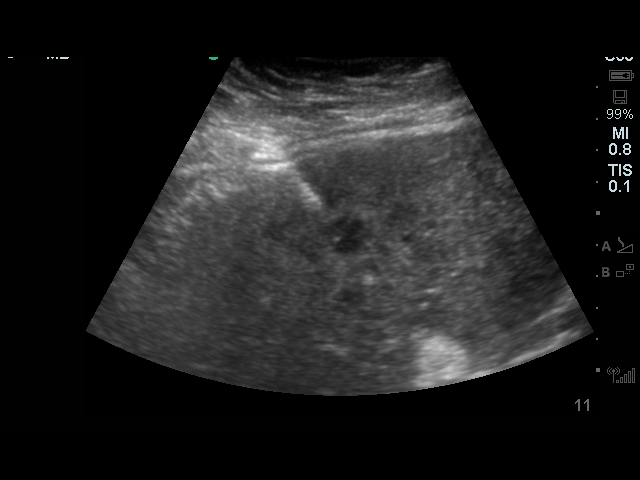
[im 9/15]
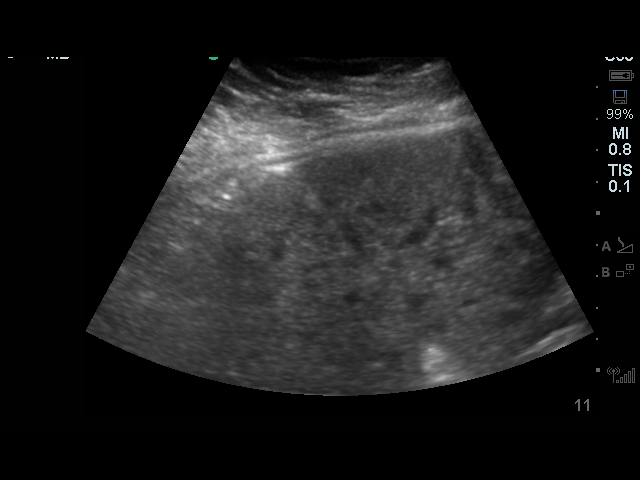
[im 10/15]
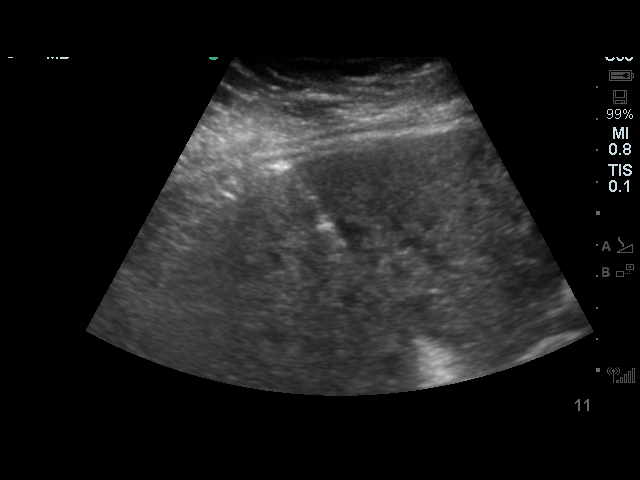
[im 11/15]
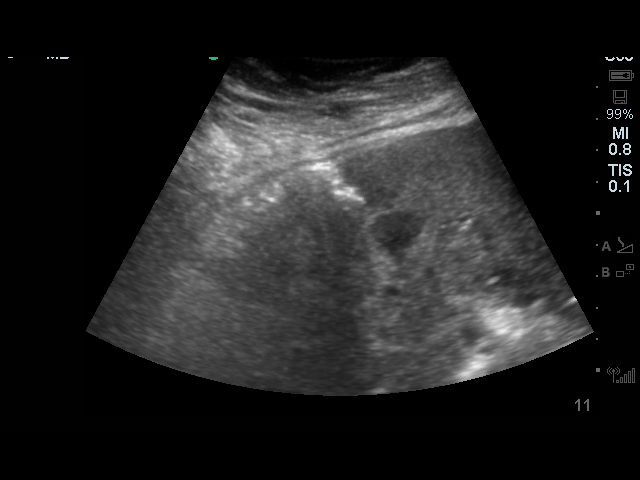
[im 12/15]
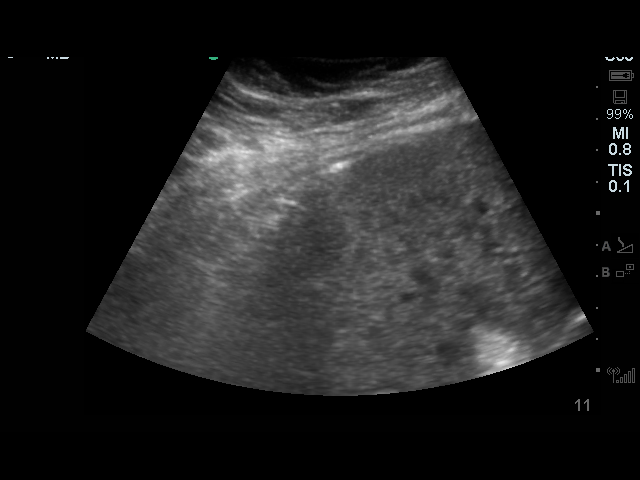
[im 13/15]
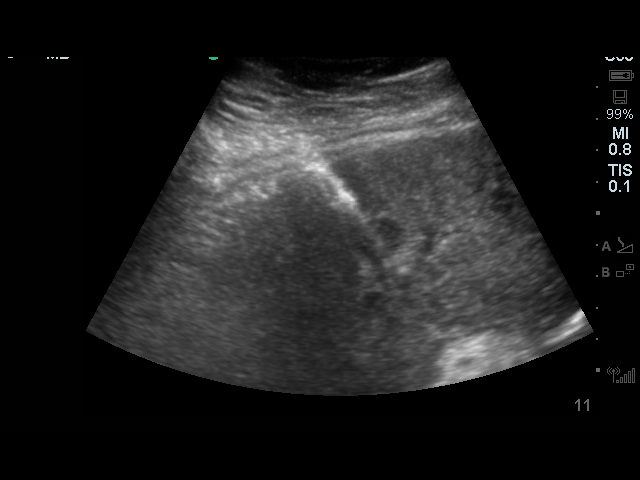
[im 14/15]
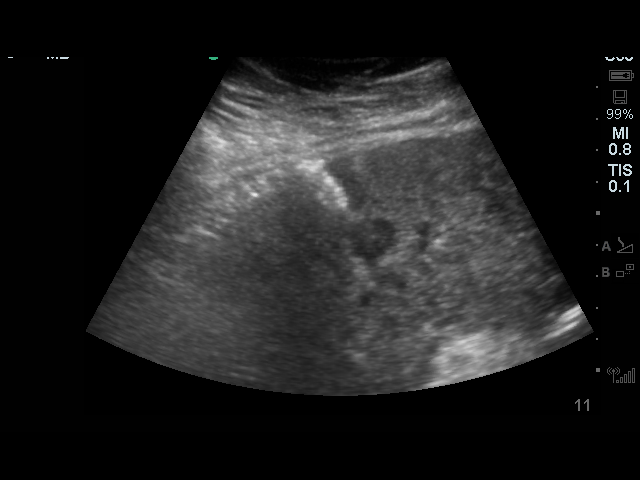
[im 15/15]
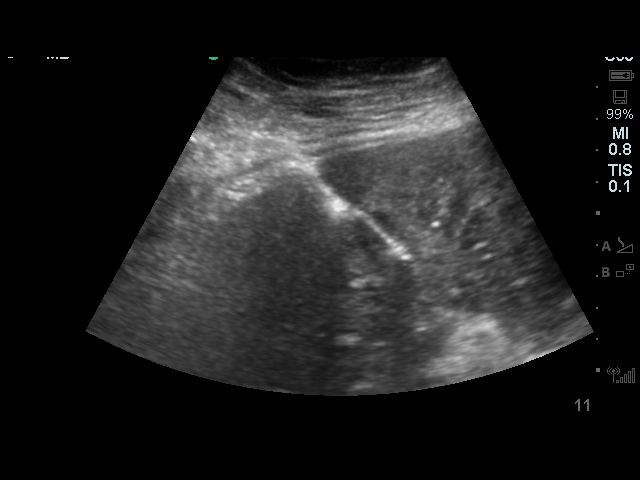

[15 of 15 positions shown; findings below may reference images not displayed]

MEDICATIONS:
None

ANESTHESIA/SEDATION:
Fentanyl 100 mcg IV; Versed 2 mg IV

Total Moderate Sedation time: 15 minutes.

The patient's level of consciousness and vital signs were monitored
continuously by radiology nursing throughout the procedure under my
direct supervision.

COMPLICATIONS:
None immediate.

PROCEDURE:
Informed written consent was obtained from the patient after a
discussion of the risks, benefits and alternatives to treatment. The
patient understands and consents the procedure. A timeout was
performed prior to the initiation of the procedure.

Ultrasound scanning was performed of the right upper abdominal
quadrant demonstrates marked heterogeneous appearance of the hepatic
parenchyma with nodular contour compatible with findings of
cirrhosis demonstrated preceding abdominal CT.

No was made of an approximately 1.3 x 1.4 cm hypoechoic lesion
within the right lobe of the liver potentially correlating with the
ill-defined hypoattenuating lesions seen on preceding abdominal CT
image 58, series 7. This lesion as well as the surrounding hepatic
parenchyma was targeted for biopsy for tissue diagnostic purposes.
The procedure was planned.

The right upper abdominal quadrant was prepped and draped in the
usual sterile fashion. The overlying soft tissues were anesthetized
with 1% lidocaine with epinephrine. A 17 gauge, 6.8 cm co-axial
needle was advanced into a peripheral aspect of the lesion. This was
followed by 5 core biopsies with an 18 gauge core device under
direct ultrasound guidance.

The coaxial needle tract was embolized with a small amount of
Gel-Foam slurry and superficial hemostasis was obtained with manual
compression. Post procedural scanning was negative for definitive
area of hemorrhage or additional complication. A dressing was
placed. The patient tolerated the procedure well without immediate
post procedural complication.
IMPRESSION: Technically successful ultrasound guided core needle biopsy of
indeterminate lesion within the right lobe of the liver.
